# Patient Record
Sex: Female | Born: 1955
Health system: Southern US, Community
[De-identification: ages and names within clinical notes are randomized; demographics above are authoritative.]

## PROBLEM LIST (undated history)

## (undated) DIAGNOSIS — K5090 Crohn's disease, unspecified, without complications: Secondary | ICD-10-CM

## (undated) DIAGNOSIS — E785 Hyperlipidemia, unspecified: Secondary | ICD-10-CM

## (undated) DIAGNOSIS — T7840XA Allergy, unspecified, initial encounter: Secondary | ICD-10-CM

## (undated) DIAGNOSIS — I10 Essential (primary) hypertension: Secondary | ICD-10-CM

## (undated) DIAGNOSIS — C55 Malignant neoplasm of uterus, part unspecified: Secondary | ICD-10-CM

## (undated) DIAGNOSIS — Z86010 Personal history of colonic polyps: Secondary | ICD-10-CM

## (undated) DIAGNOSIS — G709 Myoneural disorder, unspecified: Secondary | ICD-10-CM

## (undated) DIAGNOSIS — G35 Multiple sclerosis: Secondary | ICD-10-CM

## (undated) DIAGNOSIS — H269 Unspecified cataract: Secondary | ICD-10-CM

## (undated) DIAGNOSIS — R011 Cardiac murmur, unspecified: Secondary | ICD-10-CM

## (undated) DIAGNOSIS — M1990 Unspecified osteoarthritis, unspecified site: Secondary | ICD-10-CM

## (undated) HISTORY — DX: Unspecified cataract: H26.9

## (undated) HISTORY — DX: Unspecified osteoarthritis, unspecified site: M19.90

## (undated) HISTORY — DX: Crohn's disease, unspecified, without complications: K50.90

## (undated) HISTORY — PX: OTHER SURGICAL HISTORY: SHX169

## (undated) HISTORY — DX: Multiple sclerosis: G35

## (undated) HISTORY — PX: WISDOM TOOTH EXTRACTION: SHX21

## (undated) HISTORY — DX: Personal history of colonic polyps: Z86.010

## (undated) HISTORY — DX: Malignant neoplasm of uterus, part unspecified: C55

## (undated) HISTORY — DX: Hyperlipidemia, unspecified: E78.5

## (undated) HISTORY — PX: SPINE SURGERY: SHX786

## (undated) HISTORY — PX: ABDOMINAL HYSTERECTOMY: SHX81

## (undated) HISTORY — PX: OOPHORECTOMY: SHX86

## (undated) HISTORY — DX: Allergy, unspecified, initial encounter: T78.40XA

## (undated) HISTORY — PX: APPENDECTOMY: SHX54

## (undated) HISTORY — DX: Essential (primary) hypertension: I10

## (undated) HISTORY — PX: EYE SURGERY: SHX253

## (undated) HISTORY — PX: COLON SURGERY: SHX602

## (undated) HISTORY — DX: Myoneural disorder, unspecified: G70.9

## (undated) HISTORY — DX: Cardiac murmur, unspecified: R01.1

## (undated) HISTORY — PX: COLONOSCOPY: SHX174

---

## 1987-10-21 DIAGNOSIS — C55 Malignant neoplasm of uterus, part unspecified: Secondary | ICD-10-CM

## 1987-10-21 HISTORY — DX: Malignant neoplasm of uterus, part unspecified: C55

## 1998-04-02 ENCOUNTER — Other Ambulatory Visit: Admission: RE | Admit: 1998-04-02 | Discharge: 1998-04-02 | Payer: Self-pay | Admitting: Internal Medicine

## 1998-05-17 ENCOUNTER — Ambulatory Visit (HOSPITAL_COMMUNITY): Admission: RE | Admit: 1998-05-17 | Discharge: 1998-05-17 | Payer: Self-pay | Admitting: Internal Medicine

## 2002-01-03 ENCOUNTER — Other Ambulatory Visit: Admission: RE | Admit: 2002-01-03 | Discharge: 2002-01-03 | Payer: Self-pay | Admitting: Neurosurgery

## 2003-02-27 ENCOUNTER — Encounter: Payer: Self-pay | Admitting: Emergency Medicine

## 2003-02-27 ENCOUNTER — Emergency Department (HOSPITAL_COMMUNITY): Admission: EM | Admit: 2003-02-27 | Discharge: 2003-02-27 | Payer: Self-pay | Admitting: *Deleted

## 2003-09-04 ENCOUNTER — Other Ambulatory Visit: Admission: RE | Admit: 2003-09-04 | Discharge: 2003-09-04 | Payer: Self-pay | Admitting: Neurosurgery

## 2004-02-18 ENCOUNTER — Emergency Department (HOSPITAL_COMMUNITY): Admission: EM | Admit: 2004-02-18 | Discharge: 2004-02-18 | Payer: Self-pay | Admitting: Family Medicine

## 2004-12-20 ENCOUNTER — Ambulatory Visit: Payer: Self-pay | Admitting: Internal Medicine

## 2005-05-20 ENCOUNTER — Ambulatory Visit (HOSPITAL_COMMUNITY): Admission: RE | Admit: 2005-05-20 | Discharge: 2005-05-20 | Payer: Self-pay | Admitting: Internal Medicine

## 2005-10-20 ENCOUNTER — Encounter: Payer: Self-pay | Admitting: Internal Medicine

## 2005-10-20 LAB — CONVERTED CEMR LAB

## 2005-12-10 ENCOUNTER — Ambulatory Visit: Payer: Self-pay | Admitting: Internal Medicine

## 2005-12-17 ENCOUNTER — Ambulatory Visit: Payer: Self-pay | Admitting: Internal Medicine

## 2006-03-11 ENCOUNTER — Ambulatory Visit: Payer: Self-pay | Admitting: Internal Medicine

## 2006-03-20 ENCOUNTER — Ambulatory Visit: Payer: Self-pay | Admitting: Internal Medicine

## 2006-03-23 ENCOUNTER — Ambulatory Visit (HOSPITAL_COMMUNITY): Admission: RE | Admit: 2006-03-23 | Discharge: 2006-03-23 | Payer: Self-pay | Admitting: Infectious Diseases

## 2006-05-19 ENCOUNTER — Ambulatory Visit: Payer: Self-pay | Admitting: Internal Medicine

## 2006-07-13 ENCOUNTER — Ambulatory Visit: Payer: Self-pay | Admitting: Internal Medicine

## 2006-10-22 ENCOUNTER — Ambulatory Visit: Payer: Self-pay | Admitting: Internal Medicine

## 2006-10-22 LAB — CONVERTED CEMR LAB
ALT: 31 units/L (ref 0–40)
AST: 22 units/L (ref 0–37)
Albumin: 3.7 g/dL (ref 3.5–5.2)
Alkaline Phosphatase: 93 units/L (ref 39–117)
Basophils Absolute: 0 10*3/uL (ref 0.0–0.1)
Creatinine, Ser: 0.9 mg/dL (ref 0.4–1.2)
Eosinophil percent: 4.8 % (ref 0.0–5.0)
GFR calc non Af Amer: 70 mL/min
HCT: 42.7 % (ref 36.0–46.0)
MCHC: 34.9 g/dL (ref 30.0–36.0)
Neutro Abs: 5 10*3/uL (ref 1.4–7.7)
Potassium: 3.5 meq/L (ref 3.5–5.1)
RBC: 4.93 M/uL (ref 3.87–5.11)
RDW: 12.7 % (ref 11.5–14.6)
Sodium: 141 meq/L (ref 135–145)
Total Bilirubin: 0.9 mg/dL (ref 0.3–1.2)
Total Protein: 7.9 g/dL (ref 6.0–8.3)
VLDL: 26 mg/dL (ref 0–40)

## 2006-10-29 ENCOUNTER — Ambulatory Visit: Payer: Self-pay | Admitting: Internal Medicine

## 2007-06-02 ENCOUNTER — Encounter: Payer: Self-pay | Admitting: Internal Medicine

## 2007-06-02 DIAGNOSIS — E785 Hyperlipidemia, unspecified: Secondary | ICD-10-CM | POA: Insufficient documentation

## 2007-06-02 DIAGNOSIS — J309 Allergic rhinitis, unspecified: Secondary | ICD-10-CM | POA: Insufficient documentation

## 2007-06-02 DIAGNOSIS — I10 Essential (primary) hypertension: Secondary | ICD-10-CM | POA: Insufficient documentation

## 2007-10-21 LAB — CONVERTED CEMR LAB: Pap Smear: NORMAL

## 2007-10-22 ENCOUNTER — Ambulatory Visit: Payer: Self-pay | Admitting: Internal Medicine

## 2007-10-22 LAB — CONVERTED CEMR LAB
ALT: 37 units/L — ABNORMAL HIGH (ref 0–35)
Basophils Relative: 0.1 % (ref 0.0–1.0)
Bilirubin, Direct: 0.2 mg/dL (ref 0.0–0.3)
CO2: 31 meq/L (ref 19–32)
Eosinophils Relative: 5.6 % — ABNORMAL HIGH (ref 0.0–5.0)
GFR calc Af Amer: 113 mL/min
Glucose, Bld: 94 mg/dL (ref 70–99)
Hemoglobin: 13.9 g/dL (ref 12.0–15.0)
Ketones, urine, test strip: NEGATIVE
Lymphocytes Relative: 27.2 % (ref 12.0–46.0)
Monocytes Absolute: 0.6 10*3/uL (ref 0.2–0.7)
Neutro Abs: 4.8 10*3/uL (ref 1.4–7.7)
Neutrophils Relative %: 59.1 % (ref 43.0–77.0)
Nitrite: NEGATIVE
Potassium: 3.5 meq/L (ref 3.5–5.1)
Specific Gravity, Urine: 1.02
Total Protein: 7 g/dL (ref 6.0–8.3)
VLDL: 27 mg/dL (ref 0–40)
WBC Urine, dipstick: NEGATIVE
WBC: 8 10*3/uL (ref 4.5–10.5)

## 2007-11-03 ENCOUNTER — Encounter: Payer: Self-pay | Admitting: Internal Medicine

## 2007-11-03 ENCOUNTER — Ambulatory Visit: Payer: Self-pay | Admitting: Internal Medicine

## 2007-11-03 ENCOUNTER — Other Ambulatory Visit: Admission: RE | Admit: 2007-11-03 | Discharge: 2007-11-03 | Payer: Self-pay | Admitting: *Deleted

## 2007-11-03 DIAGNOSIS — H60399 Other infective otitis externa, unspecified ear: Secondary | ICD-10-CM | POA: Insufficient documentation

## 2007-11-12 ENCOUNTER — Telehealth: Payer: Self-pay | Admitting: *Deleted

## 2008-01-19 ENCOUNTER — Encounter: Payer: Self-pay | Admitting: Internal Medicine

## 2008-01-19 ENCOUNTER — Ambulatory Visit: Payer: Self-pay | Admitting: Internal Medicine

## 2008-03-10 ENCOUNTER — Encounter: Payer: Self-pay | Admitting: Internal Medicine

## 2008-03-10 ENCOUNTER — Ambulatory Visit: Payer: Self-pay | Admitting: Internal Medicine

## 2008-03-16 ENCOUNTER — Encounter: Payer: Self-pay | Admitting: Internal Medicine

## 2008-03-31 ENCOUNTER — Emergency Department (HOSPITAL_COMMUNITY): Admission: EM | Admit: 2008-03-31 | Discharge: 2008-03-31 | Payer: Self-pay | Admitting: Family Medicine

## 2008-05-08 ENCOUNTER — Ambulatory Visit: Payer: Self-pay | Admitting: Internal Medicine

## 2008-05-08 DIAGNOSIS — T887XXA Unspecified adverse effect of drug or medicament, initial encounter: Secondary | ICD-10-CM | POA: Insufficient documentation

## 2008-05-08 LAB — CONVERTED CEMR LAB
ALT: 30 units/L (ref 0–35)
Bilirubin, Direct: 0.1 mg/dL (ref 0.0–0.3)
CO2: 31 meq/L (ref 19–32)
Calcium: 9.3 mg/dL (ref 8.4–10.5)
GFR calc Af Amer: 112 mL/min
GFR calc non Af Amer: 93 mL/min
HDL: 32.4 mg/dL — ABNORMAL LOW (ref >39.0)
Sodium: 142 meq/L (ref 135–145)
Total Bilirubin: 0.7 mg/dL (ref 0.3–1.2)
Total CHOL/HDL Ratio: 3.7

## 2008-08-14 ENCOUNTER — Emergency Department (HOSPITAL_COMMUNITY): Admission: EM | Admit: 2008-08-14 | Discharge: 2008-08-14 | Payer: Self-pay | Admitting: Family Medicine

## 2008-10-19 ENCOUNTER — Ambulatory Visit: Payer: Self-pay | Admitting: Internal Medicine

## 2008-10-19 LAB — CONVERTED CEMR LAB
ALT: 35 units/L (ref 0–35)
AST: 26 units/L (ref 0–37)
Basophils Absolute: 0.1 10*3/uL (ref 0.0–0.1)
Bilirubin, Direct: 0.1 mg/dL (ref 0.0–0.3)
Blood in Urine, dipstick: NEGATIVE
CO2: 32 meq/L (ref 19–32)
Calcium: 9.3 mg/dL (ref 8.4–10.5)
Chloride: 100 meq/L (ref 96–112)
Cholesterol: 131 mg/dL (ref 0–200)
Eosinophils Absolute: 0.3 10*3/uL (ref 0.0–0.7)
GFR calc non Af Amer: 93 mL/min
HDL: 36.7 mg/dL — ABNORMAL LOW (ref >39.0)
Ketones, urine, test strip: NEGATIVE
LDL Cholesterol: 73 mg/dL (ref 0–99)
Lymphocytes Relative: 26.7 % (ref 12.0–46.0)
MCHC: 34.1 g/dL (ref 30.0–36.0)
MCV: 89.8 fL (ref 78.0–100.0)
Neutrophils Relative %: 58.9 % (ref 43.0–77.0)
RDW: 13 % (ref 11.5–14.6)
Sodium: 141 meq/L (ref 135–145)
TSH: 1.9 microintl units/mL (ref 0.35–5.50)
Total Bilirubin: 0.9 mg/dL (ref 0.3–1.2)
Triglycerides: 106 mg/dL (ref 0–149)
Urobilinogen, UA: 0.2
VLDL: 21 mg/dL (ref 0–40)

## 2008-10-26 ENCOUNTER — Ambulatory Visit: Payer: Self-pay | Admitting: Internal Medicine

## 2008-10-26 DIAGNOSIS — J0100 Acute maxillary sinusitis, unspecified: Secondary | ICD-10-CM | POA: Insufficient documentation

## 2008-10-26 LAB — CONVERTED CEMR LAB
CO2: 32 meq/L (ref 19–32)
Calcium: 10.2 mg/dL (ref 8.4–10.5)
GFR calc Af Amer: 84 mL/min
Potassium: 4 meq/L (ref 3.5–5.1)
Sodium: 141 meq/L (ref 135–145)

## 2009-06-03 ENCOUNTER — Emergency Department (HOSPITAL_COMMUNITY): Admission: EM | Admit: 2009-06-03 | Discharge: 2009-06-03 | Payer: Self-pay | Admitting: Emergency Medicine

## 2009-10-23 ENCOUNTER — Ambulatory Visit: Payer: Self-pay | Admitting: Internal Medicine

## 2009-10-23 LAB — CONVERTED CEMR LAB
ALT: 21 units/L (ref 0–35)
BUN: 15 mg/dL (ref 6–23)
Basophils Absolute: 0.1 10*3/uL (ref 0.0–0.1)
CO2: 29 meq/L (ref 19–32)
Chloride: 99 meq/L (ref 96–112)
Cholesterol: 132 mg/dL (ref 0–200)
Eosinophils Relative: 3.7 % (ref 0.0–5.0)
Glucose, Bld: 109 mg/dL — ABNORMAL HIGH (ref 70–99)
Glucose, Urine, Semiquant: NEGATIVE
HCT: 42.5 % (ref 36.0–46.0)
Lymphs Abs: 2 10*3/uL (ref 0.7–4.0)
MCV: 90.4 fL (ref 78.0–100.0)
Monocytes Absolute: 0.7 10*3/uL (ref 0.1–1.0)
Platelets: 268 10*3/uL (ref 150.0–400.0)
Potassium: 3.3 meq/L — ABNORMAL LOW (ref 3.5–5.1)
RDW: 13.4 % (ref 11.5–14.6)
Total Bilirubin: 1.1 mg/dL (ref 0.3–1.2)
pH: 5

## 2009-11-12 ENCOUNTER — Other Ambulatory Visit: Admission: RE | Admit: 2009-11-12 | Discharge: 2009-11-12 | Payer: Self-pay | Admitting: Internal Medicine

## 2009-11-12 ENCOUNTER — Ambulatory Visit: Payer: Self-pay | Admitting: Internal Medicine

## 2009-11-12 DIAGNOSIS — H6120 Impacted cerumen, unspecified ear: Secondary | ICD-10-CM | POA: Insufficient documentation

## 2009-11-21 LAB — CONVERTED CEMR LAB: Pap Smear: NEGATIVE

## 2010-01-04 ENCOUNTER — Ambulatory Visit: Payer: Self-pay | Admitting: Internal Medicine

## 2010-01-04 LAB — CONVERTED CEMR LAB
CO2: 32 meq/L (ref 19–32)
Calcium: 9.4 mg/dL (ref 8.4–10.5)
Chloride: 103 meq/L (ref 96–112)
Glucose, Bld: 102 mg/dL — ABNORMAL HIGH (ref 70–99)
Sodium: 139 meq/L (ref 135–145)

## 2010-01-10 ENCOUNTER — Ambulatory Visit: Payer: Self-pay | Admitting: Internal Medicine

## 2010-01-10 DIAGNOSIS — R609 Edema, unspecified: Secondary | ICD-10-CM | POA: Insufficient documentation

## 2010-01-10 DIAGNOSIS — M170 Bilateral primary osteoarthritis of knee: Secondary | ICD-10-CM | POA: Insufficient documentation

## 2010-02-11 ENCOUNTER — Ambulatory Visit: Payer: Self-pay | Admitting: Internal Medicine

## 2010-05-09 ENCOUNTER — Ambulatory Visit: Payer: Self-pay | Admitting: Internal Medicine

## 2010-11-11 ENCOUNTER — Other Ambulatory Visit: Payer: Self-pay | Admitting: Internal Medicine

## 2010-11-11 ENCOUNTER — Ambulatory Visit
Admission: RE | Admit: 2010-11-11 | Discharge: 2010-11-11 | Payer: Self-pay | Source: Home / Self Care | Attending: Internal Medicine | Admitting: Internal Medicine

## 2010-11-11 LAB — HEPATIC FUNCTION PANEL
ALT: 18 U/L (ref 0–35)
AST: 19 U/L (ref 0–37)
Albumin: 4 g/dL (ref 3.5–5.2)
Total Bilirubin: 0.8 mg/dL (ref 0.3–1.2)
Total Protein: 7.2 g/dL (ref 6.0–8.3)

## 2010-11-11 LAB — CBC WITH DIFFERENTIAL/PLATELET
Basophils Relative: 0.4 % (ref 0.0–3.0)
Eosinophils Relative: 1.8 % (ref 0.0–5.0)
HCT: 40.7 % (ref 36.0–46.0)
Hemoglobin: 13.6 g/dL (ref 12.0–15.0)
Lymphs Abs: 1.8 10*3/uL (ref 0.7–4.0)
MCV: 88.8 fl (ref 78.0–100.0)
Monocytes Absolute: 0.8 10*3/uL (ref 0.1–1.0)
Monocytes Relative: 8.4 % (ref 3.0–12.0)
Neutro Abs: 6.4 10*3/uL (ref 1.4–7.7)
WBC: 9.2 10*3/uL (ref 4.5–10.5)

## 2010-11-11 LAB — LIPID PANEL
Cholesterol: 106 mg/dL (ref 0–200)
LDL Cholesterol: 54 mg/dL (ref 0–99)
VLDL: 17.4 mg/dL (ref 0.0–40.0)

## 2010-11-11 LAB — CONVERTED CEMR LAB
Protein, U semiquant: NEGATIVE
Urobilinogen, UA: 0.2
WBC Urine, dipstick: NEGATIVE

## 2010-11-11 LAB — BASIC METABOLIC PANEL
Chloride: 102 mEq/L (ref 96–112)
GFR: 59.43 mL/min — ABNORMAL LOW (ref >60.00)
Potassium: 4.7 mEq/L (ref 3.5–5.1)
Sodium: 140 mEq/L (ref 135–145)

## 2010-11-11 LAB — TSH: TSH: 1.12 u[IU]/mL (ref 0.35–5.50)

## 2010-11-17 LAB — CONVERTED CEMR LAB
Cholesterol, target level: 200 mg/dL
LDL Goal: 130 mg/dL

## 2010-11-18 ENCOUNTER — Ambulatory Visit
Admission: RE | Admit: 2010-11-18 | Discharge: 2010-11-18 | Payer: Self-pay | Source: Home / Self Care | Attending: Internal Medicine | Admitting: Internal Medicine

## 2010-11-18 DIAGNOSIS — N61 Mastitis without abscess: Secondary | ICD-10-CM | POA: Insufficient documentation

## 2010-11-19 NOTE — Assessment & Plan Note (Signed)
Summary: 2 month follow up/cjr   Vital Signs:  Patient profile:   55 year old female Height:      63 inches Weight:      246 pounds BMI:     43.73 Temp:     98.2 degrees F oral Pulse rate:   72 / minute Resp:     14 per minute BP sitting:   130 / 80  (left arm) Cuff size:   large  Vitals Entered By: Allyne Gee, LPN (January 10, 2201 5:42 AM) CC: roa labs   Primary Care Provider:  Benay Pillow  CC:  roa labs.  History of Present Illness: increased swelling in the left foot  new job is  more seditary right knee pain has increased and  exercize has decreased this has been an issue for  at least 4-5 the pai is more medically and their is not acute injury hx the right knee is "normal"   Preventive Screening-Counseling & Management  Alcohol-Tobacco     Smoking Status: never     Passive Smoke Exposure: no  Current Problems (verified): 1)  Cerumen Impaction, Bilateral  (ICD-380.4) 2)  Morbid Obesity  (ICD-278.01) 3)  Sinusitis, Maxillary, Acute  (ICD-461.0) 4)  Uns Advrs Eff Uns Rx Medicinal&biological Sbstnc  (ICD-995.20) 5)  Special Screening For Malignant Neoplasms Colon  (ICD-V76.51) 6)  Otitis Externa, Bilateral  (ICD-380.10) 7)  Family History of Cad Female 1st Degree Relative <50  (ICD-V17.3) 8)  Physical Examination  (ICD-V70.0) 9)  ? of Crohn's Disease  (ICD-555.9) 10)  Hypertension  (ICD-401.9) 11)  Hyperlipidemia  (ICD-272.4) 12)  Allergic Rhinitis  (ICD-477.9)  Current Medications (verified): 1)  Multivitamins   Caps (Multiple Vitamin) .... Once Daily 2)  Tenoretic 50 50-25 Mg  Tabs (Atenolol-Chlorthalidone) .... Take 1 Tablet By Mouth Once A Day 3)  Lipitor 20 Mg  Tabs (Atorvastatin Calcium) .... Take 1 Tablet By Mouth At Bedtime 4)  Allegra-D 12 Hour 60-120 Mg Xr12h-Tab (Fexofenadine-Pseudoephedrine) .... One By Mouth Bid 5)  Cortomycin 3.5-10000-1 Susp (Neomycin-Polymyxin-Hc) .... 4 Drops in Both Ears  Qid 6)  Potassium Chloride Cr 8 Meq Cr-Tabs  (Potassium Chloride) .... One By Mouth Daily  Allergies (verified): 1)  ! Septra (Sulfamethoxazole-Trimethoprim) 2)  ! 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  Past Medical History: Reviewed history from 01/19/2008 and no changes required. Allergic rhinitis Hyperlipidemia Hypertension  ? Crohns colitis  Past Surgical History: Reviewed history from 01/19/2008 and no changes required. Appendectomy Hysterectomy Oophorectomy partial  Family History: Reviewed history from 11/03/2007 and no changes required. Family History of CAD Female 1st degree relative <50 CABG Family History Hypertension mother uterine cancer  Social History: Reviewed history from 01/19/2008 and no changes required. 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    Physical Exam  General:  healthy appearing and obese.   Head:  normocephalic.  no abnormalities palpated.    Eyes:  no icterus.pupils equal, pupils round, and pupils reactive to light.   Nose:  no external deformity and nasal dischargemucosal pallor.   Neck:  Supple; no masses or thyromegaly. Lungs:  Clear throughout to auscultation. Heart:  Regular rate and rhythm; no murmurs, rubs,  or bruits. Abdomen:  Bowel sounds positive,abdomen soft and non-tender without masses, organomegaly or hernias noted. Msk:  No deformity or scoliosis noted of thoracic or lumbar spine.   Pulses:  R and L carotid,radial,femoral,dorsalis pedis and posterior tibial pulses are full and equal bilaterally Extremities:  No clubbing, cyanosis, edema, or deformity noted with normal full range of motion of all joints.   Neurologic:  No cranial nerve deficits noted. Station and gait are normal. Plantar reflexes are down-going bilaterally. DTRs are symmetrical throughout. Sensory, motor and coordinative functions appear intact.   Impression & Recommendations:  Problem # 1:  MORBID OBESITY (ICD-278.01)  Ht: 63 (01/10/2010)   Wt: 246 (01/10/2010)   BMI: 43.73 (01/10/2010)  Problem # 2:  HYPERTENSION (ICD-401.9)  the potassium has increased Her updated medication list for this problem includes:    Tenoretic 50 50-25 Mg Tabs (Atenolol-chlorthalidone) .Marland Kitchen... Take 1 tablet by mouth once a day  BP today: 130/80 Prior BP: 130/80 (11/12/2009)  Prior 10 Yr Risk Heart Disease: 7 % (10/26/2008)  Labs Reviewed: K+: 3.6 (01/04/2010) Creat: : 0.8 (01/04/2010)   Chol: 132 (10/23/2009)   HDL: 38.30 (10/23/2009)   LDL: 68 (10/23/2009)   TG: 130.0 (10/23/2009)  Problem # 3:  LOC OSTEOARTHROS NOT SPEC PRIM/SEC LOWER LEG (ICD-715.36)  Discussed use of medications, application of heat or cold, and exercises.   Orders: Joint Aspirate / Injection, Large (20610) Depo- Medrol 86m (J1030) T-Knee Comp Left 4 Views ((11735AP  Problem # 4:  EDEMA (IOLI-1033)  dependent edema mobilizations is the best intervention conside compresion  stocking  Her updated medication list for this problem includes:    Tenoretic 50 50-25 Mg Tabs (Atenolol-chlorthalidone) ..Marland Kitchen.. Take 1 tablet by mouth once a day  Discussed elevation of the legs, use of compression stockings, sodium restiction, and medication use.   Complete Medication List: 1)  Multivitamins Caps (Multiple vitamin) .... Once daily 2)  Tenoretic 50 50-25 Mg Tabs (Atenolol-chlorthalidone) .... Take 1 tablet by mouth once a day 3)  Lipitor 20 Mg Tabs (Atorvastatin calcium) .... Take 1 tablet by mouth at bedtime 4)  Allegra-d 12 Hour 60-120 Mg Xr12h-tab (Fexofenadine-pseudoephedrine) .... One by mouth bid 5)  Cortomycin 3.5-10000-1 Susp (Neomycin-polymyxin-hc) .... 4 drops in both ears  qid 6)  Klor-con M10 10 Meq Cr-tabs (Potassium chloride crys cr) .... 0ne by mouth daily  Patient Instructions: 1)  Please schedule a follow-up appointment in 4 months. 2)  if the knee pain does not improve and the paln films shjow arthritis we will consider an MRI and ortho referal 3)  consider compression stockings  Prescriptions: KLOR-CON M10 10 MEQ CR-TABS (POTASSIUM CHLORIDE CRYS CR) 0ne by mouth daily  #30 x 11   Entered and Authorized by:   JRicard DillonMD   Signed by:   JRicard DillonMD on 01/10/2010   Method used:   Electronically to        [facility])  [address], [place]  [address]       Ph: [phone]       Fax: [phone]   RxID:   [id number]

## 2010-11-19 NOTE — Assessment & Plan Note (Signed)
Summary: 4 monthrov/njr   Vital Signs:  Patient profile:   55 year old female Height:      63 inches Weight:      246 pounds BMI:     43.73 Temp:     98.2 degrees F oral Pulse rate:   72 / minute Resp:     14 per minute BP sitting:   124 / 80  (left arm) Cuff size:   large  Vitals Entered By: Allyne Gee, LPN (May 09, 8785 7:67 AM) CC: roa- discuss knee xray, Hypertension Management, Lipid Management Is Patient Diabetic? No   Primary Care Provider:  Benay Pillow  CC:  roa- discuss knee xray, Hypertension Management, and Lipid Management.  History of Present Illness: follow up visit/ good response to the injection and review of the xray with mild to moderate OA CPX due in Jan  Hypertension History:      She denies headache, chest pain, palpitations, dyspnea with exertion, orthopnea, PND, peripheral edema, visual symptoms, neurologic problems, syncope, and side effects from treatment.        Positive major cardiovascular risk factors include female age 77 years old or older, hyperlipidemia, hypertension, and family history for ischemic heart disease (males less than 45 years old).  Negative major cardiovascular risk factors include no history of diabetes and non-tobacco-user status.        Further assessment for target organ damage reveals no history of ASHD, stroke/TIA, or peripheral vascular disease.    Lipid Management History:      Positive NCEP/ATP III risk factors include female age 50 years old or older, HDL cholesterol less than 68, family history for ischemic heart disease (males less than 43 years old), and hypertension.  Negative NCEP/ATP III risk factors include no history of early menopause without estrogen hormone replacement, non-diabetic, non-tobacco-user status, no ASHD (atherosclerotic heart disease), no prior stroke/TIA, no peripheral vascular disease, and no history of aortic aneurysm.        Adjunctive measures started by the patient include aerobic  exercise, omega-3 supplements, and weight reduction.  She expresses no side effects from her lipid-lowering medication.  The patient denies any symptoms to suggest myopathy or liver disease.      Preventive Screening-Counseling & Management  Alcohol-Tobacco     Smoking Status: never     Passive Smoke Exposure: no  Problems Prior to Update: 1)  Edema  (ICD-782.3) 2)  Loc Osteoarthros Not Spec Prim/sec Lower Leg  (ICD-715.36) 3)  Cerumen Impaction, Bilateral  (ICD-380.4) 4)  Morbid Obesity  (ICD-278.01) 5)  Sinusitis, Maxillary, Acute  (ICD-461.0) 6)  Uns Advrs Eff Uns Rx Medicinal&biological Sbstnc  (ICD-995.20) 7)  Special Screening For Malignant Neoplasms Colon  (ICD-V76.51) 8)  Otitis Externa, Bilateral  (ICD-380.10) 9)  Family History of Cad Female 1st Degree Relative <50  (ICD-V17.3) 10)  Physical Examination  (ICD-V70.0) 11)  ? of Crohn's Disease  (ICD-555.9) 12)  Hypertension  (ICD-401.9) 13)  Hyperlipidemia  (ICD-272.4) 14)  Allergic Rhinitis  (ICD-477.9)  Current Problems (verified): 1)  Edema  (ICD-782.3) 2)  Loc Osteoarthros Not Spec Prim/sec Lower Leg  (ICD-715.36) 3)  Cerumen Impaction, Bilateral  (ICD-380.4) 4)  Morbid Obesity  (ICD-278.01) 5)  Sinusitis, Maxillary, Acute  (ICD-461.0) 6)  Uns Advrs Eff Uns Rx Medicinal&biological Sbstnc  (ICD-995.20) 7)  Special Screening For Malignant Neoplasms Colon  (ICD-V76.51) 8)  Otitis Externa, Bilateral  (ICD-380.10) 9)  Family History of Cad Female 1st Degree Relative <50  (ICD-V17.3) 10)  Physical Examination  (  ICD-V70.0) 11)  ? of Crohn's Disease  (ICD-555.9) 12)  Hypertension  (ICD-401.9) 13)  Hyperlipidemia  (ICD-272.4) 14)  Allergic Rhinitis  (ICD-477.9)  Medications Prior to Update: 1)  Multivitamins   Caps (Multiple Vitamin) .... Once Daily 2)  Tenoretic 50 50-25 Mg  Tabs (Atenolol-Chlorthalidone) .... Take 1 Tablet By Mouth Once A Day 3)  Lipitor 20 Mg  Tabs (Atorvastatin Calcium) .... Take 1 Tablet By Mouth At  Bedtime 4)  Allegra-D 12 Hour 60-120 Mg Xr12h-Tab (Fexofenadine-Pseudoephedrine) .... One By Mouth Bid 5)  Cortomycin 3.5-10000-1 Susp (Neomycin-Polymyxin-Hc) .... 4 Drops in Both Ears  Qid 6)  Klor-Con M10 10 Meq Cr-Tabs (Potassium Chloride Crys Cr) .... 0ne By Mouth Daily  Current Medications (verified): 1)  Multivitamins   Caps (Multiple Vitamin) .... Once Daily 2)  Tenoretic 50 50-25 Mg  Tabs (Atenolol-Chlorthalidone) .... Take 1 Tablet By Mouth Once A Day 3)  Lipitor 20 Mg  Tabs (Atorvastatin Calcium) .... Take 1 Tablet By Mouth At Bedtime 4)  Allegra-D 12 Hour 60-120 Mg Xr12h-Tab (Fexofenadine-Pseudoephedrine) .... One By Mouth Bid 5)  Cortomycin 3.5-10000-1 Susp (Neomycin-Polymyxin-Hc) .... 4 Drops in Both Ears  Qid 6)  Klor-Con M10 10 Meq Cr-Tabs (Potassium Chloride Crys Cr) .... 0ne By Mouth Daily  Allergies (verified): 1)  ! Septra (Sulfamethoxazole-Trimethoprim) 2)  ! 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  Past Medical History: Reviewed history from 01/19/2008 and no changes required. Allergic rhinitis Hyperlipidemia Hypertension  ? Crohns colitis  Past Surgical History: Reviewed history from 01/19/2008 and no changes required. Appendectomy Hysterectomy Oophorectomy partial  Family History: Reviewed history from 11/03/2007 and no changes required. Family History of CAD Female 1st degree relative <50 CABG Family History Hypertension mother uterine cancer  Social  History: Reviewed history from 01/19/2008 and no changes required. 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    Physical Exam  General:  healthy appearing and obese.   Head:  normocephalic.  no abnormalities palpated.   Eyes:  no icterus.pupils equal, pupils round, and pupils reactive to light.   Ears:  R ear normal and L ear normal.   Nose:  no external deformity and nasal dischargemucosal pallor.   Mouth:  pharynx pink and moist and posterior lymphoid hypertrophy.   Neck:  Supple; no masses or thyromegaly. Chest Wall:  No deformities, masses, or tenderness noted. Lungs:  Clear throughout to auscultation. Heart:  Regular rate and rhythm; no murmurs, rubs,  or bruits. Abdomen:  Bowel sounds positive,abdomen soft and non-tender without masses, organomegaly or hernias noted.   Impression & Recommendations:  Problem # 1:  HYPERTENSION (ICD-401.9) Assessment Unchanged  Her updated medication list for this  problem includes:    Tenoretic 50 50-25 Mg Tabs (Atenolol-chlorthalidone) .Marland Kitchen... Take 1 tablet by mouth once a day  BP today: 124/80 Prior BP: 130/80 (01/10/2010)  10 Yr Risk Heart Disease: 8 % Prior 10 Yr Risk Heart Disease: 7 % (10/26/2008)  Labs Reviewed: K+: 3.6 (01/04/2010) Creat: : 0.8 (01/04/2010)   Chol: 132 (10/23/2009)   HDL: 38.30 (10/23/2009)   LDL: 68 (10/23/2009)   TG: 130.0 (10/23/2009)  Problem # 2:   HYPERLIPIDEMIA (ICD-272.4) Assessment: Unchanged  Her updated medication list for this problem includes:    Lipitor 20 Mg Tabs (Atorvastatin calcium) .Marland Kitchen... Take 1 tablet by mouth at bedtime  Labs Reviewed: SGOT: 20 (10/23/2009)   SGPT: 21 (10/23/2009)  Lipid Goals: Chol Goal: 200 (11/03/2007)   HDL Goal: 40 (11/03/2007)   LDL Goal: 130 (11/03/2007)   TG Goal: 150 (11/03/2007)  10 Yr Risk Heart Disease: 8 % Prior 10 Yr Risk Heart Disease: 7 % (10/26/2008)   HDL:38.30 (10/23/2009), 36.7 (10/19/2008)  LDL:68 (10/23/2009), 73 (10/19/2008)  Chol:132 (10/23/2009), 131 (10/19/2008)  Trig:130.0 (10/23/2009), 106 (10/19/2008)  Problem # 3:  ALLERGIC RHINITIS (ICD-477.9) Assessment: Unchanged  Discussed use of allergy medications and environmental measures.   Problem # 4:  MORBID OBESITY (ICD-278.01) Assessment: Unchanged  Ht: 63 (05/09/2010)   Wt: 246 (05/09/2010)   BMI: 43.73 (05/09/2010)  Complete Medication List: 1)  Multivitamins Caps (Multiple vitamin) .... Once daily 2)  Tenoretic 50 50-25 Mg Tabs (Atenolol-chlorthalidone) .... Take 1 tablet by mouth once a day 3)  Lipitor 20 Mg Tabs (Atorvastatin calcium) .... Take 1 tablet by mouth at bedtime 4)  Allegra-d 12 Hour 60-120 Mg Xr12h-tab (Fexofenadine-pseudoephedrine) .... One by mouth bid 5)  Cortomycin 3.5-10000-1 Susp (Neomycin-polymyxin-hc) .... 4 drops in both ears  qid 6)  Klor-con M10 10 Meq Cr-tabs (Potassium chloride crys cr) .... 0ne by mouth daily  Hypertension Assessment/Plan:      The patient's hypertensive risk group is category B: At least one risk factor (excluding diabetes) with no target organ damage.  Her calculated 10 year risk of coronary heart disease is 8 %.  Today's blood pressure is 124/80.  Her blood pressure goal is < 140/90.  Lipid Assessment/Plan:      Based on NCEP/ATP III, the patient's risk factor category is "2 or more risk factors and a calculated 10 year CAD risk of < 20%".  The patient's lipid  goals are as follows: Total cholesterol goal is 200; LDL cholesterol goal is 130; HDL cholesterol goal is 40; Triglyceride goal is 150.  Her LDL cholesterol goal has been met.    Patient Instructions: 1)  Jan CPX  Prevention & Chronic Care Immunizations   Influenza vaccine: Historical  (08/24/2009)    Tetanus booster: 10/20/2005: Tdap    Pneumococcal vaccine: Not documented  Colorectal Screening   Hemoccult: Not documented    Colonoscopy: Location:  [place].    (03/10/2008)   Colonoscopy due: 02/2018  Other Screening   Pap smear: normal  (11/12/2009)   Pap smear due: 11/2010    Mammogram: normal  (01/17/2009)   Mammogram due: 01/2010   Smoking status: never  (05/09/2010)  Lipids   Total Cholesterol: 132  (10/23/2009)   LDL: 68  (10/23/2009)   LDL Direct: Not documented   HDL: 38.30  (10/23/2009)   Triglycerides: 130.0  (10/23/2009)    SGOT (AST): 20  (10/23/2009)   SGPT (ALT): 21  (10/23/2009)   Alkaline phosphatase: 97  (10/23/2009)   Total bilirubin: 1.1  (  10/23/2009)  Hypertension   Last Blood Pressure: 124 / 80  (05/09/2010)   Serum creatinine: 0.8  (01/04/2010)   Serum potassium 3.6  (01/04/2010)  Self-Management Support :    Hypertension self-management support: Not documented    Lipid self-management support: Not documented

## 2010-11-19 NOTE — Assessment & Plan Note (Signed)
Summary: cpx abnd pap/bmw   Vital Signs:  Patient profile:   55 year old female Height:      63 inches Weight:      247 pounds BMI:     43.91 Temp:     98.2 degrees F oral Pulse rate:   72 / minute Resp:     14 per minute BP sitting:   130 / 80  (left arm)  Vitals Entered By: Allyne Gee, LPN (November 12, 930 12:06 PM) CC: cpx and pap     Last PAP Result normal   Primary Care Provider:  Benay Pillow  CC:  cpx and pap.  History of Present Illness: The pt was asked about all immunizations, health maint. services that are appropriate to their age and was given guidance on diet exercize  and weight management wax impaction in both ears  Preventive Screening-Counseling & Management  Alcohol-Tobacco     Smoking Status: never     Passive Smoke Exposure: no  Problems Prior to Update: 1)  Morbid Obesity  (ICD-278.01) 2)  Sinusitis, Maxillary, Acute  (ICD-461.0) 3)  Uns Advrs Eff Uns Rx Medicinal&biological Sbstnc  (ICD-995.20) 4)  Special Screening For Malignant Neoplasms Colon  (ICD-V76.51) 5)  Otitis Externa, Bilateral  (ICD-380.10) 6)  Family History of Cad Female 1st Degree Relative <50  (ICD-V17.3) 7)  Physical Examination  (ICD-V70.0) 8)  ? of Crohn's Disease  (ICD-555.9) 9)  Hypertension  (ICD-401.9) 10)  Hyperlipidemia  (ICD-272.4) 11)  Allergic Rhinitis  (ICD-477.9)  Medications Prior to Update: 1)  Multivitamins   Caps (Multiple Vitamin) .... Once Daily 2)  Tenoretic 50 50-25 Mg  Tabs (Atenolol-Chlorthalidone) .... Take 1 Tablet By Mouth Once A Day 3)  Lipitor 20 Mg  Tabs (Atorvastatin Calcium) .... Take 1 Tablet By Mouth At Bedtime 4)  Allegra-D 12 Hour 60-120 Mg Xr12h-Tab (Fexofenadine-Pseudoephedrine) .... One By Mouth Bid 5)  Cortomycin 3.5-10000-1 Susp (Neomycin-Polymyxin-Hc) .... 4 Drops in Both Ears  Qid 6)  Clarithromycin 500 Mg Xr24h-Tab (Clarithromycin) .... Two By Mouth Daily  Current Medications (verified): 1)  Multivitamins   Caps (Multiple  Vitamin) .... Once Daily 2)  Tenoretic 50 50-25 Mg  Tabs (Atenolol-Chlorthalidone) .... Take 1 Tablet By Mouth Once A Day 3)  Lipitor 20 Mg  Tabs (Atorvastatin Calcium) .... Take 1 Tablet By Mouth At Bedtime 4)  Allegra-D 12 Hour 60-120 Mg Xr12h-Tab (Fexofenadine-Pseudoephedrine) .... One By Mouth Bid 5)  Cortomycin 3.5-10000-1 Susp (Neomycin-Polymyxin-Hc) .... 4 Drops in Both Ears  Qid 6)  Potassium Chloride Cr 8 Meq Cr-Tabs (Potassium Chloride) .... One By Mouth Daily  Allergies (verified): 1)  ! Septra (Sulfamethoxazole-Trimethoprim) 2)  ! 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  Past Medical History: Reviewed history from 01/19/2008 and no changes required. Allergic rhinitis Hyperlipidemia Hypertension  ? Crohns colitis  Past Surgical History: Reviewed history from 01/19/2008 and no changes required. Appendectomy Hysterectomy Oophorectomy partial  Family History: Reviewed history from 11/03/2007 and no changes required. Family History of CAD Female 1st degree relative <50 CABG Family History Hypertension mother uterine cancer  Social History: Reviewed history from 01/19/2008 and no changes required. 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   Head:  normocephalic.  no abnormalities palpated.   Eyes:  no icterus.pupils equal, pupils round, and pupils reactive to light.   Ears:  R ear  normal and L ear normal.   Nose:  no external deformity and nasal dischargemucosal pallor.   Mouth:  pharynx pink and moist and posterior lymphoid hypertrophy.   Neck:  Supple; no masses or thyromegaly. Chest Wall:  No deformities, masses, or tenderness noted. Breasts:  no masses and no abnormal thickening.   Lungs:  Clear throughout to auscultation. Heart:  Regular rate and rhythm; no murmurs, rubs,  or bruits. Genitalia:  normal introitus.  hysterectomy status Msk:  normal ROM.   Pulses:  R and L carotid,radial,femoral,dorsalis pedis and posterior tibial pulses are full and equal bilaterally Extremities:  1+ left pedal edema and 1+ right pedal edema.   Neurologic:  alert & oriented X3, DTRs symmetrical and normal, and finger-to-nose normal.     Impression & Recommendations:  Problem # 1:  PHYSICAL EXAMINATION (ICD-V70.0) Assessment Unchanged  Mammogram: normal (01/17/2009) Pap smear: normal (11/12/2009) Colonoscopy: Location:  [facility].   (03/10/2008) Td Booster: Tdap (10/20/2005)   Chol: 132 (10/23/2009)   HDL: 38.30 (10/23/2009)   LDL: 68 (10/23/2009)   TG: 130.0 (10/23/2009) TSH: 2.83 (10/23/2009)   Next mammogram due:: 01/2010 (11/12/2009) Next Colonoscopy due:: 02/2018 (03/28/2008)  Discussed using sunscreen, use of alcohol, drug use, self breast exam, routine dental care, routine eye care, schedule for GYN exam, routine physical exam, seat belts, multiple vitamins, osteoporosis prevention, adequate calcium intake in diet, recommendations for immunizations, mammograms and Pap smears.  Discussed exercise and checking cholesterol.  Discussed gun safety, safe sex, and contraception.  Problem # 2:  HYPERTENSION (ICD-401.9) Assessment: Deteriorated hypokalemia Her updated medication list for this problem includes:    Tenoretic 50 50-25 Mg Tabs (Atenolol-chlorthalidone) .Marland Kitchen... Take 1 tablet by mouth once a day  BP today: 130/80 Prior BP: 122/78  (10/26/2008)  Prior 10 Yr Risk Heart Disease: 7 % (10/26/2008)  Labs Reviewed: K+: 3.3 (10/23/2009) Creat: : 0.8 (10/23/2009)   Chol: 132 (10/23/2009)   HDL: 38.30 (10/23/2009)   LDL: 68 (10/23/2009)   TG: 130.0 (10/23/2009)  Her updated medication list for this problem includes:    Tenoretic 50 50-25 Mg Tabs (Atenolol-chlorthalidone) .Marland Kitchen... Take 1 tablet by mouth once a day  Problem # 3:  MORBID OBESITY (ICD-278.01) has lost 5 pounds Ht: 63 (11/12/2009)   Wt: 247 (11/12/2009)   BMI: 43.91 (11/12/2009)  Problem # 4:  HYPERLIPIDEMIA (ICD-272.4) stable Her updated medication list for this problem includes:    Lipitor 20 Mg Tabs (Atorvastatin calcium) .Marland Kitchen... Take 1 tablet by mouth at bedtime  Labs Reviewed: SGOT: 20 (10/23/2009)   SGPT: 21 (10/23/2009)  Lipid Goals: Chol Goal: 200 (11/03/2007)   HDL Goal: 40 (11/03/2007)   LDL Goal: 130 (11/03/2007)   TG Goal: 150 (11/03/2007)  Prior 10 Yr Risk Heart Disease: 7 % (10/26/2008)   HDL:38.30 (10/23/2009), 36.7 (10/19/2008)  LDL:68 (10/23/2009), 73 (10/19/2008)  Chol:132 (10/23/2009), 131 (10/19/2008)  Trig:130.0 (10/23/2009), 106 (10/19/2008)  Problem # 5:  CERUMEN IMPACTION, BILATERAL (ICD-380.4)  informed conset obtained, using a cerumin spoon the wax impaction was dislodged and the canal was lavaged with 1/2 peroxide and 1/2 warm water solution until clear  Orders:  Cerumen Impaction Removal (44695)  Complete Medication List: 1)  Multivitamins Caps (Multiple vitamin) .... Once daily 2)  Tenoretic 50 50-25 Mg Tabs (Atenolol-chlorthalidone) .... Take 1 tablet by mouth once a day 3)  Lipitor 20 Mg Tabs (Atorvastatin calcium) .... Take 1 tablet by mouth at bedtime 4)  Allegra-d 12 Hour 60-120 Mg Xr12h-tab (Fexofenadine-pseudoephedrine) .... One by mouth bid 5)  Cortomycin 3.5-10000-1 Susp (Neomycin-polymyxin-hc) .... 4 drops in both ears  qid 6)  Potassium Chloride Cr 8 Meq Cr-tabs (Potassium chloride) .... One by mouth  daily  Patient Instructions: 1)  Please schedule a follow-up appointment in 2 months. 2)  BMP prior to visit, ICD-9:401.9 Prescriptions: CORTOMYCIN 3.5-10000-1 SUSP (NEOMYCIN-POLYMYXIN-HC) 4 drops in both ears  qid  #10cc x 0   Entered and Authorized by:   Ricard Dillon MD   Signed by:   Ricard Dillon MD on 11/12/2009   Method used:   Electronically to        [facility] (retail)       [address], [place]  [address]       Ph: [phone]       Fax: [phone]   RxID:   [id number] POTASSIUM CHLORIDE CR 8 MEQ CR-TABS (POTASSIUM CHLORIDE) one by mouth daily  #30 x 11   Entered and Authorized by:   Ricard Dillon MD   Signed by:   Ricard Dillon MD on 11/12/2009   Method used:   Electronically to        [facility] (retail)       [address]       [place], [place]  [address]       Ph: [phone]       Fax: [phone]   RxID:   [id number]    Preventive Care Screening  Mammogram:    Date:  01/17/2009    Next Due:  01/2010    Results:  normal   Pap Smear:    Date:  11/12/2009    Next Due:  11/2010    Results:  normal    Orders Added: 1)  Est. Patient 40-64 years [59747] 2)  Cerumen Impaction Removal [69210]    Immunization History:  Influenza Immunization History:    Influenza:  historical (08/24/2009)

## 2010-11-27 NOTE — Assessment & Plan Note (Signed)
Summary: CPX/PAP/NJR   Vital Signs:  Patient profile:   55 year old female Height:      63 inches Weight:      221 pounds BMI:     39.29 Temp:     98.2 degrees F oral Pulse rate:   72 / minute Resp:     14 per minute BP sitting:   120 / 80  (left arm)  Vitals Entered By: Allyne Gee, LPN (November 18, 3210 8:50 AM) CC: cpx-mam scheduled for next week-pap last year--LOST 25 POUNDS  Is Patient Diabetic? No   Primary Care Provider:  Benay Pillow  CC:  cpx-mam scheduled for next week-pap last year--LOST 25 POUNDS .  History of Present Illness:  this is a 55 year old white female who presents for a routine complete physical examination and followup her comorbidities of hypertension hyperlipidemia and obesity. She has lost 31 pounds by our scales in 6 months for an excellent response both in her cholesterol blood pressure and in her blood glucose given her hypertension and hyperlipidemia and her glycemia it is likely that she has this metabolic syndrome and the weight loss has pulled her away from the edge of diabetes .The pt was asked about all immunizations, health maint. services that are appropriate to their age and was given guidance on diet exercize  and weight management   Preventive Screening-Counseling & Management  Alcohol-Tobacco     Smoking Status: never     Passive Smoke Exposure: no  Problems Prior to Update: 1)  Mastitis  (ICD-611.0) 2)  Edema  (ICD-782.3) 3)  Loc Osteoarthros Not Spec Prim/sec Lower Leg  (ICD-715.36) 4)  Cerumen Impaction, Bilateral  (ICD-380.4) 5)  Morbid Obesity  (ICD-278.01) 6)  Sinusitis, Maxillary, Acute  (ICD-461.0) 7)  Uns Advrs Eff Uns Rx Medicinal&biological Sbstnc  (ICD-995.20) 8)  Special Screening For Malignant Neoplasms Colon  (ICD-V76.51) 9)  Otitis Externa, Bilateral  (ICD-380.10) 10)  Family History of Cad Female 1st Degree Relative <50  (ICD-V17.3) 11)  Physical Examination  (ICD-V70.0) 12)  ? of Crohn's Disease   (ICD-555.9) 13)  Hypertension  (ICD-401.9) 14)  Hyperlipidemia  (ICD-272.4) 15)  Allergic Rhinitis  (ICD-477.9)  Current Problems (verified): 1)  Edema  (ICD-782.3) 2)  Loc Osteoarthros Not Spec Prim/sec Lower Leg  (ICD-715.36) 3)  Cerumen Impaction, Bilateral  (ICD-380.4) 4)  Morbid Obesity  (ICD-278.01) 5)  Sinusitis, Maxillary, Acute  (ICD-461.0) 6)  Uns Advrs Eff Uns Rx Medicinal&biological Sbstnc  (ICD-995.20) 7)  Special Screening For Malignant Neoplasms Colon  (ICD-V76.51) 8)  Otitis Externa, Bilateral  (ICD-380.10) 9)  Family History of Cad Female 1st Degree Relative <50  (ICD-V17.3) 10)  Physical Examination  (ICD-V70.0) 11)  ? of Crohn's Disease  (ICD-555.9) 12)  Hypertension  (ICD-401.9) 13)  Hyperlipidemia  (ICD-272.4) 14)  Allergic Rhinitis  (ICD-477.9)  Medications Prior to Update: 1)  Multivitamins   Caps (Multiple Vitamin) .... Once Daily 2)  Tenoretic 50 50-25 Mg  Tabs (Atenolol-Chlorthalidone) .... Take 1 Tablet By Mouth Once A Day 3)  Lipitor 20 Mg  Tabs (Atorvastatin Calcium) .... Take 1 Tablet By Mouth At Bedtime 4)  Allegra-D 12 Hour 60-120 Mg Xr12h-Tab (Fexofenadine-Pseudoephedrine) .... One By Mouth Bid 5)  Cortomycin 3.5-10000-1 Susp (Neomycin-Polymyxin-Hc) .... 4 Drops in Both Ears  Qid 6)  Klor-Con M10 10 Meq Cr-Tabs (Potassium Chloride Crys Cr) .... 0ne By Mouth Daily  Current Medications (verified): 1)  Multivitamins   Caps (Multiple Vitamin) .... Once Daily 2)  Tenoretic 50 50-25 Mg  Tabs (Atenolol-Chlorthalidone) .... Take 1 Tablet By Mouth Once A Day 3)  Lipitor 20 Mg  Tabs (Atorvastatin Calcium) .... Take 1 Tablet By Mouth At Bedtime 4)  Allegra-D 12 Hour 60-120 Mg Xr12h-Tab (Fexofenadine-Pseudoephedrine) .... One By Mouth Bid 5)  Klor-Con M10 10 Meq Cr-Tabs (Potassium Chloride Crys Cr) .... 0ne By Mouth Daily 6)  Doxycycline Hyclate 100 Mg Caps (Doxycycline Hyclate) .... One By Mouth Twice A Day  Allergies (verified): 1)  ! Septra 2)  ! 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  Past Medical History: Reviewed history from 01/19/2008 and no changes required. Allergic rhinitis Hyperlipidemia Hypertension  ? Crohns colitis  Past Surgical History: Reviewed history from 01/19/2008 and no changes required. Appendectomy Hysterectomy Oophorectomy partial  Family History: Reviewed history from 11/03/2007 and no changes required. Family History of CAD Female 1st degree relative <50 CABG Family History Hypertension mother uterine cancer  Social History: Reviewed history from 01/19/2008 and no changes required. 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    Physical Exam  General:  healthy appearing and obese.   Head:  normocephalic.  no abnormalities palpated.   Eyes:  no icterus.pupils equal, pupils round, and pupils reactive to light.   Ears:  R ear normal and L ear normal.   Nose:  no external  deformity and nasal dischargemucosal pallor.   Mouth:  pharynx pink and moist and posterior lymphoid hypertrophy.   Neck:  Supple; no masses or thyromegaly. Breasts:  L breast tender and L breast erythematous.   Lungs:  normal respiratory effort and no wheezes.   Heart:  normal rate and regular rhythm.   Abdomen:  Bowel sounds positive,abdomen soft and non-tender without masses, organomegaly or hernias noted. Msk:  No deformity or scoliosis noted of thoracic or lumbar spine.   Pulses:  R and L carotid,radial,femoral,dorsalis pedis and posterior tibial pulses are full and equal bilaterally Extremities:  No clubbing, cyanosis, edema, or deformity noted with normal full range of motion of all joints.   Neurologic:  No cranial nerve deficits noted. Station and gait are normal. Plantar reflexes are down-going bilaterally. DTRs are symmetrical throughout. Sensory, motor and coordinative functions appear intact. Skin:   S. pustular inflammation at 6 PM underneath the left breast nipple. Cervical Nodes:  No lymphadenopathy noted Axillary Nodes:  No palpable lymphadenopathy   Impression & Recommendations:  Problem # 1:  MORBID OBESITY (ICD-278.01) Assessment Improved  weight loss and exercise the patient has reduced her BMI to the point where she is no longer considered morbidly obese Ht: 63 (11/18/2010)   Wt: 221 (11/18/2010)   BMI: 39.29 (11/18/2010)  current goal is to  reach 200 pounds by next visit  Problem # 2:  PHYSICAL EXAMINATION (ICD-V70.0) Assessment: Unchanged The pt was asked about all immunizations, health maint. services that are appropriate to their age and was given guidance on diet exercize  and weight management  Mammogram: normal (01/17/2009) Pap smear: normal (11/12/2009) Colonoscopy: Location:  [facility].   (03/10/2008) Td Booster: Tdap (10/20/2005)   Flu Vax: Historical (08/24/2009)   Chol: 106 (11/11/2010)   HDL: 34.90 (11/11/2010)   LDL: 54 (11/11/2010)    TG: 87.0 (11/11/2010) TSH: 1.12 (11/11/2010)   Next mammogram due:: 01/2010 (11/12/2009) Next Colonoscopy due:: 02/2018 (03/28/2008)  Discussed using sunscreen, use of alcohol, drug use, self breast exam, routine dental care, routine eye care, schedule for GYN exam, routine physical exam, seat belts, multiple vitamins, osteoporosis prevention, adequate calcium intake in diet, recommendations for immunizations, mammograms and Pap smears.  Discussed exercise and checking cholesterol.  Discussed gun safety, safe sex, and contraception.  Problem # 3:  HYPERTENSION (ICD-401.9) Assessment: Improved  Her updated medication list for this problem includes:    Tenoretic 50 50-25 Mg Tabs (Atenolol-chlorthalidone) .Marland Kitchen... Take 1 tablet by mouth once a day  BP today: 120/80 Prior BP: 124/80 (05/09/2010)  Prior 10 Yr Risk Heart Disease: 8 % (05/09/2010)  Labs Reviewed: K+: 4.7 (11/11/2010) Creat: : 1.0 (11/11/2010)   Chol: 106 (11/11/2010)   HDL: 34.90 (11/11/2010)   LDL: 54 (11/11/2010)   TG: 87.0 (11/11/2010)  Problem # 4:  HYPERLIPIDEMIA (ICD-272.4) Assessment: Improved  Her updated medication list for this problem includes:    Lipitor 20 Mg Tabs (Atorvastatin calcium) .Marland Kitchen... Take 1 tablet by mouth at bedtime  Labs Reviewed: SGOT: 19 (11/11/2010)   SGPT: 18 (11/11/2010)  Lipid Goals: Chol Goal: 200 (11/03/2007)   HDL Goal: 40 (11/03/2007)   LDL Goal: 130 (11/03/2007)   TG Goal: 150 (11/03/2007)  Prior 10 Yr Risk Heart Disease: 8 % (05/09/2010)   HDL:34.90 (11/11/2010), 38.30 (10/23/2009)  LDL:54 (11/11/2010), 68 (10/23/2009)  Chol:106 (11/11/2010), 132 (10/23/2009)  Trig:87.0 (11/11/2010), 130.0 (10/23/2009)  Problem # 5:  MASTITIS (ICD-611.0) Assessment: New  pustular inflammation of the left breast at approximately 6 PM below the nipple. With risk of MR SA due to occupation even though this appears to be a staph infection will treat with doxycycline 100 twice a day for 14 days and  instructed patient that if the skin changes did not improve dramatically that we would refer her differential diagnosis does include other causes of inflammatory mastitis and if resolution is not apparent neoplastic evaluation would be initiated.  Complete Medication List: 1)  Multivitamins Caps (Multiple vitamin) .... Once daily 2)  Tenoretic 50 50-25 Mg Tabs (Atenolol-chlorthalidone) .... Take 1 tablet by mouth once a day 3)  Lipitor 20 Mg Tabs (Atorvastatin calcium) .... Take 1 tablet by mouth at bedtime 4)  Allegra-d 12 Hour 60-120 Mg Xr12h-tab (Fexofenadine-pseudoephedrine) .... One by mouth bid 5)  Klor-con M10 10 Meq Cr-tabs (Potassium chloride crys cr) .... 0ne by mouth daily 6)  Doxycycline Hyclate 100 Mg Caps (Doxycycline hyclate) .... One by mouth twice a day  Patient Instructions: 1)  Please schedule a follow-up appointment in 4-6 months. Prescriptions: DOXYCYCLINE HYCLATE 100 MG CAPS (DOXYCYCLINE HYCLATE) one by mouth twice a day  #28 x 0   Entered and Authorized by:   Ricard Dillon MD   Signed by:   Ricard Dillon MD on 11/18/2010   Method used:   Electronically to  [facility] (retail)       [address]       [place], [place]  [address]       Ph: [phone]       Fax: [phone]   RxID:   [id number] KLOR-CON M10 10 MEQ CR-TABS (POTASSIUM CHLORIDE CRYS CR) 0ne by mouth daily  #30 x 11   Entered by:   Allyne Gee, LPN   Authorized by:   Ricard Dillon MD   Signed by:   Allyne Gee, LPN on 45/80/9983   Method used:   Electronically to        [facility] (retail)       [address]       [place], [place]  [address]       Ph: [phone]       Fax: [phone]   RxID:   [id number] ALLEGRA-D 12 HOUR 60-120 MG XR12H-TAB (FEXOFENADINE-PSEUDOEPHEDRINE) one by mouth BID  #90 x 3   Entered by:   Allyne Gee, LPN    Authorized by:   Ricard Dillon MD   Signed by:   Allyne Gee, LPN on 22/97/9892   Method used:   Electronically to        [facility] (retail)       [address]       [place], [place]  [address]       Ph: [phone]       Fax: [phone]   RxID:   [id number] LIPITOR 20 MG  TABS (ATORVASTATIN CALCIUM) Take 1 tablet by mouth at bedtime  #90 x 3   Entered by:   Allyne Gee, LPN   Authorized by:   Ricard Dillon MD   Signed by:   Allyne Gee, LPN on 86/76/7209   Method used:   Electronically to        [facility] (retail)       [address]       [place], [place]  [address]       Ph: [phone]       Fax: [phone]   RxID:   [id number] TENORETIC 50 50-25 MG  TABS (ATENOLOL-CHLORTHALIDONE) Take 1 tablet by mouth once a day  #90 x 3   Entered by:   Allyne Gee, LPN   Authorized by:   Ricard Dillon MD   Signed by:   Allyne Gee, LPN on 96/75/9163   Method used:   Electronically to        [facility] (retail)       [address]       [place], [place]  [address]       Ph: [phone]       Fax: [phone]   RxID:   [id number]    Orders Added: 1)  Est. Patient 40-64 years [63893] 2)  Est. Patient Level IV [73428]     Preventive Care Screening  Pap Smear:    Next Due:  11/2011

## 2011-03-04 ENCOUNTER — Encounter: Payer: Self-pay | Admitting: Internal Medicine

## 2011-03-07 ENCOUNTER — Encounter: Payer: Self-pay | Admitting: Internal Medicine

## 2011-03-07 ENCOUNTER — Ambulatory Visit (INDEPENDENT_AMBULATORY_CARE_PROVIDER_SITE_OTHER): Payer: 59 | Admitting: Internal Medicine

## 2011-03-07 VITALS — BP 126/80 | HR 72 | Temp 98.2°F | Resp 14 | Ht 64.0 in | Wt 210.0 lb

## 2011-03-07 DIAGNOSIS — I10 Essential (primary) hypertension: Secondary | ICD-10-CM

## 2011-03-07 DIAGNOSIS — H60399 Other infective otitis externa, unspecified ear: Secondary | ICD-10-CM

## 2011-03-07 DIAGNOSIS — E669 Obesity, unspecified: Secondary | ICD-10-CM

## 2011-03-07 DIAGNOSIS — H6090 Unspecified otitis externa, unspecified ear: Secondary | ICD-10-CM

## 2011-03-07 DIAGNOSIS — E785 Hyperlipidemia, unspecified: Secondary | ICD-10-CM

## 2011-03-07 MED ORDER — OFLOXACIN 0.3 % OT SOLN: 5.0000 [drp] | Freq: Two times a day (BID) | OTIC | Status: AC

## 2011-03-07 NOTE — Progress Notes (Signed)
  Subjective:    Patient ID: Savannah Morrow, female    DOB: 1956/08/05, 55 y.o.   MRN: [id number]  HPI Patient is a 55 year old white female who presents for followup of hypertension hyperlipidemia and previous diagnoses of morbid obesity.  Her weight is up to 210 pounds and a body mass index now below 40 in the mid 30s she is in the obese range now not morbidly obese.  She has lost a total of 50 pounds and her weight loss efforts.  She denies any chest pain shortness of breath PND orthopnea she is tolerating her medications well she has a chief complaint of ear pain in the left ear.     Review of Systems  Constitutional: Negative for activity change, appetite change and fatigue.  HENT: Positive for ear pain, sinus pressure and ear discharge. Negative for congestion, neck pain and postnasal drip.   Eyes: Negative for redness and visual disturbance.  Respiratory: Negative for cough, shortness of breath and wheezing.   Gastrointestinal: Negative for abdominal pain and abdominal distention.  Genitourinary: Negative for dysuria, frequency and menstrual problem.  Musculoskeletal: Negative for myalgias, joint swelling and arthralgias.  Skin: Negative for rash and wound.  Neurological: Negative for dizziness, weakness and headaches.  Hematological: Negative for adenopathy. Does not bruise/bleed easily.  Psychiatric/Behavioral: Negative for sleep disturbance and decreased concentration.       Objective:   Physical Exam  Constitutional: She is oriented to person, place, and time. She appears well-developed and well-nourished. No distress.  HENT:  Head: Normocephalic and atraumatic.  Right Ear: External ear normal.  Left Ear: External ear normal.  Nose: Nose normal.  Mouth/Throat: Oropharynx is clear and moist.       Left ear canal and inflamed right ear canal and plan  Eyes: Conjunctivae and EOM are normal. Pupils are equal, round, and reactive to light.  Neck: Normal range of motion.  Neck supple. No JVD present. No tracheal deviation present. No thyromegaly present.  Cardiovascular: Normal rate, regular rhythm, normal heart sounds and intact distal pulses.   No murmur heard. Pulmonary/Chest: Effort normal and breath sounds normal. She has no wheezes. She exhibits no tenderness.  Abdominal: Soft. Bowel sounds are normal.  Musculoskeletal: Normal range of motion. She exhibits no edema and no tenderness.  Lymphadenopathy:    She has no cervical adenopathy.  Neurological: She is alert and oriented to person, place, and time. She has normal reflexes. No cranial nerve deficit.  Skin: Skin is warm and dry. She is not diaphoretic.  Psychiatric: She has a normal mood and affect. Her behavior is normal.          Assessment & Plan:  Obesity plan is being followed by the patient she has progressive weight loss she is now down total of 50 pounds.  Blood pressure has responded to this and excellent blood pressure today 126/80.  Lipid monitoring was not encompassed at this visit will be monitored in 4 months Treatment of otitis externa with Floxin otic drops prescription given to patient

## 2011-08-08 ENCOUNTER — Ambulatory Visit: Payer: 59 | Admitting: Internal Medicine

## 2011-11-13 ENCOUNTER — Other Ambulatory Visit (INDEPENDENT_AMBULATORY_CARE_PROVIDER_SITE_OTHER): Payer: 59

## 2011-11-13 DIAGNOSIS — Z0000 Encounter for general adult medical examination without abnormal findings: Secondary | ICD-10-CM

## 2011-11-13 LAB — BASIC METABOLIC PANEL
CO2: 31 mEq/L (ref 19–32)
GFR: 79.52 mL/min (ref >60.00)
Glucose, Bld: 97 mg/dL (ref 70–99)
Potassium: 3.9 mEq/L (ref 3.5–5.1)
Sodium: 143 mEq/L (ref 135–145)

## 2011-11-13 LAB — CBC WITH DIFFERENTIAL/PLATELET
Basophils Absolute: 0 10*3/uL (ref 0.0–0.1)
Basophils Relative: 0.1 % (ref 0.0–3.0)
HCT: 41.6 % (ref 36.0–46.0)
Hemoglobin: 14.2 g/dL (ref 12.0–15.0)
Lymphs Abs: 2 10*3/uL (ref 0.7–4.0)
MCHC: 34.3 g/dL (ref 30.0–36.0)
Monocytes Relative: 9.5 % (ref 3.0–12.0)
Neutro Abs: 3.6 10*3/uL (ref 1.4–7.7)
RBC: 4.67 Mil/uL (ref 3.87–5.11)
RDW: 13.4 % (ref 11.5–14.6)

## 2011-11-13 LAB — LIPID PANEL
Cholesterol: 127 mg/dL (ref 0–200)
VLDL: 22.4 mg/dL (ref 0.0–40.0)

## 2011-11-13 LAB — POCT URINALYSIS DIPSTICK
Bilirubin, UA: NEGATIVE
Glucose, UA: NEGATIVE
Ketones, UA: NEGATIVE
Leukocytes, UA: NEGATIVE
Spec Grav, UA: 1.015

## 2011-11-13 LAB — HEPATIC FUNCTION PANEL
ALT: 27 U/L (ref 0–35)
AST: 21 U/L (ref 0–37)
Albumin: 4.1 g/dL (ref 3.5–5.2)
Total Protein: 7.4 g/dL (ref 6.0–8.3)

## 2011-11-13 LAB — TSH: TSH: 1.56 u[IU]/mL (ref 0.35–5.50)

## 2011-11-18 ENCOUNTER — Other Ambulatory Visit: Payer: Self-pay | Admitting: Internal Medicine

## 2011-11-18 DIAGNOSIS — Z1231 Encounter for screening mammogram for malignant neoplasm of breast: Secondary | ICD-10-CM

## 2011-11-20 ENCOUNTER — Ambulatory Visit (INDEPENDENT_AMBULATORY_CARE_PROVIDER_SITE_OTHER): Payer: 59 | Admitting: Internal Medicine

## 2011-11-20 ENCOUNTER — Encounter: Payer: Self-pay | Admitting: Internal Medicine

## 2011-11-20 DIAGNOSIS — I10 Essential (primary) hypertension: Secondary | ICD-10-CM

## 2011-11-20 DIAGNOSIS — E669 Obesity, unspecified: Secondary | ICD-10-CM

## 2011-11-20 DIAGNOSIS — Z0000 Encounter for general adult medical examination without abnormal findings: Secondary | ICD-10-CM

## 2011-11-20 MED ORDER — ATORVASTATIN CALCIUM 20 MG PO TABS: 20.0000 mg | ORAL_TABLET | Freq: Every day | ORAL | Status: DC

## 2011-11-20 MED ORDER — ATENOLOL-CHLORTHALIDONE 50-25 MG PO TABS: 1.0000 | ORAL_TABLET | Freq: Every day | ORAL | Status: DC

## 2011-11-20 MED ORDER — POTASSIUM CHLORIDE ER 10 MEQ PO TBCR: 10.0000 meq | EXTENDED_RELEASE_TABLET | Freq: Every day | ORAL | Status: DC

## 2011-11-20 NOTE — Patient Instructions (Signed)
The patient is instructed to continue all medications as prescribed. Schedule followup with check out clerk upon leaving the clinic  

## 2011-11-20 NOTE — Progress Notes (Signed)
Subjective:    Patient ID: Savannah Morrow, female    DOB: 04/20/1956, 56 y.o.   MRN: [id number]  HPI Patient is a 56 year old white female who is followed for multiple medical problems and presents today for complete physical examination.  Her comorbid problems are followed today include obesity hyperlipidemia hypertension and a history of chronic cerumen impaction.  She has a chief complaint today of persistent ear irritation. She admits to gaining 10 pounds since her last office visit but has lost 5 pounds recently as she has reactivated her diet and exercise program   Review of Systems  Constitutional: Negative for activity change, appetite change and fatigue.  HENT: Negative for ear pain, congestion, neck pain, postnasal drip and sinus pressure.   Eyes: Negative for redness and visual disturbance.  Respiratory: Negative for cough, shortness of breath and wheezing.   Gastrointestinal: Negative for abdominal pain and abdominal distention.  Genitourinary: Negative for dysuria, frequency and menstrual problem.  Musculoskeletal: Negative for myalgias, joint swelling and arthralgias.  Skin: Negative for rash and wound.  Neurological: Negative for dizziness, weakness and headaches.  Hematological: Negative for adenopathy. Does not bruise/bleed easily.  Psychiatric/Behavioral: Negative for sleep disturbance and decreased concentration.   Past Medical History  Diagnosis Date  . Allergy   . Hyperlipidemia   . Hypertension   . Crohn's disease     History   Social History  . Marital Status: Married    Spouse Name: N/A    Number of Children: N/A  . Years of Education: N/A   Occupational History  . Not on file.   Social History Main Topics  . Smoking status: Never Smoker   . Smokeless tobacco: Not on file  . Alcohol Use: No  . Drug Use: No  . Sexually Active: Yes   Other Topics Concern  . Not on file   Social History Narrative  . No narrative on file    Past Surgical  History  Procedure Date  . Appendectomy   . Abdominal hysterectomy   . Oophorectomy     Family History  Problem Relation Age of Onset  . Coronary artery disease    . Uterine cancer Mother   . Alzheimer's disease Mother   . Heart disease Father   . Hypertension Father   . Cancer Father     b cell lymphoma    Allergies  Allergen Reactions  . Meperidine Hcl     REACTION: unspecified  . Sulfamethoxazole W/Trimethoprim     REACTION: unspecified    Current Outpatient Prescriptions on File Prior to Visit  Medication Sig Dispense Refill  . multivitamin (THERAGRAN) per tablet Take 1 tablet by mouth daily.        . potassium chloride (KLOR-CON) 10 MEQ CR tablet Take 10 mEq by mouth daily.          BP 146/80  Pulse 72  Temp 98.2 F (36.8 C)  Resp 16  Ht 5' 4"  (1.626 m)  Wt 214 lb (97.07 kg)  BMI 36.73 kg/m2       Objective:   Physical Exam  Nursing note reviewed. Constitutional: She is oriented to person, place, and time. She appears well-developed and well-nourished. No distress.  HENT:  Head: Normocephalic and atraumatic.  Right Ear: External ear normal.  Left Ear: External ear normal.  Nose: Nose normal.  Mouth/Throat: Oropharynx is clear and moist.  Eyes: Conjunctivae and EOM are normal. Pupils are equal, round, and reactive to light.  Neck: Normal range of  motion. Neck supple. No JVD present. No tracheal deviation present. No thyromegaly present.  Cardiovascular: Normal rate, regular rhythm, normal heart sounds and intact distal pulses.   No murmur heard. Pulmonary/Chest: Effort normal and breath sounds normal. She has no wheezes. She exhibits no tenderness.  Abdominal: Soft. Bowel sounds are normal.  Musculoskeletal: Normal range of motion. She exhibits no edema and no tenderness.  Lymphadenopathy:    She has no cervical adenopathy.  Neurological: She is alert and oriented to person, place, and time. She has normal reflexes. No cranial nerve deficit.  Skin:  Skin is warm and dry. She is not diaphoretic.  Psychiatric: She has a normal mood and affect. Her behavior is normal.          Assessment & Plan:   This is a routine physical examination for this healthy  Female. Reviewed all health maintenance protocols including mammography colonoscopy bone density and reviewed appropriate screening labs. Her immunization history was reviewed as well as her current medications and allergies refills of her chronic medications were given and the plan for yearly health maintenance was discussed all orders and referrals were made as appropriate. We reviewed her risks for this metabolic syndrome currently her fasting blood glucose has normalized.  I believe this is due to the metabolic cycle but she is in at this time.  She is losing weight and that allows her body to be able to control her blood glucose we went over in detail this process with her son she is an Therapist, sports.  We have set a weight goal of 199 pounds prior to the next office visit   Blood pressure is moderately elevated due to anxiety over her weight gain she states her home blood pressures are in the 130/80 range we will continue to monitor

## 2011-11-26 ENCOUNTER — Ambulatory Visit
Admission: RE | Admit: 2011-11-26 | Discharge: 2011-11-26 | Disposition: A | Discharge status: Home / Self Care | Payer: 59 | Source: Ambulatory Visit | Attending: Internal Medicine | Admitting: Internal Medicine

## 2011-11-26 DIAGNOSIS — Z1231 Encounter for screening mammogram for malignant neoplasm of breast: Secondary | ICD-10-CM

## 2012-03-19 ENCOUNTER — Ambulatory Visit: Payer: 59 | Admitting: Internal Medicine

## 2012-06-14 ENCOUNTER — Ambulatory Visit: Payer: 59 | Admitting: Internal Medicine

## 2012-07-21 ENCOUNTER — Ambulatory Visit: Payer: 59 | Admitting: Internal Medicine

## 2012-11-11 ENCOUNTER — Other Ambulatory Visit: Payer: Self-pay | Admitting: *Deleted

## 2012-11-11 ENCOUNTER — Encounter: Payer: Self-pay | Admitting: Internal Medicine

## 2012-11-11 MED ORDER — ATENOLOL-CHLORTHALIDONE 50-25 MG PO TABS: 1.0000 | ORAL_TABLET | Freq: Every day | ORAL | Status: DC

## 2012-11-11 MED ORDER — POTASSIUM CHLORIDE ER 10 MEQ PO TBCR: 10.0000 meq | EXTENDED_RELEASE_TABLET | Freq: Every day | ORAL | Status: DC

## 2012-11-11 MED ORDER — ATORVASTATIN CALCIUM 20 MG PO TABS: 20.0000 mg | ORAL_TABLET | Freq: Every day | ORAL | Status: DC

## 2012-11-15 ENCOUNTER — Other Ambulatory Visit: Payer: Self-pay | Admitting: Internal Medicine

## 2012-11-15 DIAGNOSIS — Z1231 Encounter for screening mammogram for malignant neoplasm of breast: Secondary | ICD-10-CM

## 2012-11-17 ENCOUNTER — Other Ambulatory Visit: Payer: 59

## 2012-11-23 ENCOUNTER — Encounter: Payer: 59 | Admitting: Internal Medicine

## 2012-11-30 ENCOUNTER — Ambulatory Visit
Admission: RE | Admit: 2012-11-30 | Discharge: 2012-11-30 | Disposition: A | Discharge status: Home / Self Care | Payer: 59 | Source: Ambulatory Visit | Attending: Internal Medicine | Admitting: Internal Medicine

## 2012-11-30 DIAGNOSIS — Z1231 Encounter for screening mammogram for malignant neoplasm of breast: Secondary | ICD-10-CM

## 2012-12-04 ENCOUNTER — Other Ambulatory Visit: Payer: Self-pay

## 2012-12-05 ENCOUNTER — Encounter: Payer: Self-pay | Admitting: Internal Medicine

## 2012-12-08 ENCOUNTER — Encounter: Payer: Self-pay | Admitting: Internal Medicine

## 2013-02-28 ENCOUNTER — Other Ambulatory Visit (INDEPENDENT_AMBULATORY_CARE_PROVIDER_SITE_OTHER): Payer: 59

## 2013-02-28 DIAGNOSIS — Z0000 Encounter for general adult medical examination without abnormal findings: Secondary | ICD-10-CM

## 2013-02-28 LAB — HEPATIC FUNCTION PANEL
ALT: 21 U/L (ref 0–35)
Albumin: 3.9 g/dL (ref 3.5–5.2)
Alkaline Phosphatase: 73 U/L (ref 39–117)
Bilirubin, Direct: 0 mg/dL (ref 0.0–0.3)
Total Protein: 7.8 g/dL (ref 6.0–8.3)

## 2013-02-28 LAB — POCT URINALYSIS DIPSTICK
Ketones, UA: NEGATIVE
Protein, UA: NEGATIVE
Spec Grav, UA: 1.02
Urobilinogen, UA: 0.2
pH, UA: 5

## 2013-02-28 LAB — CBC WITH DIFFERENTIAL/PLATELET
Basophils Relative: 0.4 % (ref 0.0–3.0)
Eosinophils Absolute: 0.3 10*3/uL (ref 0.0–0.7)
Eosinophils Relative: 4.5 % (ref 0.0–5.0)
Hemoglobin: 14.5 g/dL (ref 12.0–15.0)
MCHC: 34.2 g/dL (ref 30.0–36.0)
MCV: 86.6 fl (ref 78.0–100.0)
Monocytes Absolute: 0.6 10*3/uL (ref 0.1–1.0)
Neutro Abs: 4.7 10*3/uL (ref 1.4–7.7)
Neutrophils Relative %: 61.2 % (ref 43.0–77.0)
RBC: 4.89 Mil/uL (ref 3.87–5.11)
WBC: 7.6 10*3/uL (ref 4.5–10.5)

## 2013-02-28 LAB — BASIC METABOLIC PANEL
CO2: 29 mEq/L (ref 19–32)
Chloride: 102 mEq/L (ref 96–112)
Potassium: 4.2 mEq/L (ref 3.5–5.1)
Sodium: 141 mEq/L (ref 135–145)

## 2013-02-28 LAB — LIPID PANEL
LDL Cholesterol: 63 mg/dL (ref 0–99)
VLDL: 23.2 mg/dL (ref 0.0–40.0)

## 2013-03-07 ENCOUNTER — Encounter: Payer: Self-pay | Admitting: Internal Medicine

## 2013-03-07 ENCOUNTER — Ambulatory Visit (INDEPENDENT_AMBULATORY_CARE_PROVIDER_SITE_OTHER): Payer: 59 | Admitting: Internal Medicine

## 2013-03-07 ENCOUNTER — Other Ambulatory Visit (HOSPITAL_COMMUNITY)
Admission: RE | Admit: 2013-03-07 | Discharge: 2013-03-07 | Disposition: A | Discharge status: Home / Self Care | Payer: 59 | Source: Ambulatory Visit | Attending: Internal Medicine | Admitting: Internal Medicine

## 2013-03-07 VITALS — BP 132/80 | HR 76 | Temp 98.2°F | Resp 16 | Ht 64.0 in | Wt 226.0 lb

## 2013-03-07 DIAGNOSIS — Z01419 Encounter for gynecological examination (general) (routine) without abnormal findings: Secondary | ICD-10-CM | POA: Insufficient documentation

## 2013-03-07 DIAGNOSIS — Z0000 Encounter for general adult medical examination without abnormal findings: Secondary | ICD-10-CM

## 2013-03-07 DIAGNOSIS — R7301 Impaired fasting glucose: Secondary | ICD-10-CM

## 2013-03-07 LAB — HEMOGLOBIN A1C: Hgb A1c MFr Bld: 5.7 % (ref 4.6–6.5)

## 2013-03-07 NOTE — Progress Notes (Signed)
Subjective:    Patient ID: Savannah Morrow, female    DOB: January 06, 1956, 57 y.o.   MRN: [id number]  HPI CPX Under stress with death of father and care for mother  Elevated weight and elevated glucose with high DM risk      Review of Systems  Constitutional: Negative for activity change, appetite change and fatigue.  HENT: Negative for ear pain, congestion, neck pain, postnasal drip and sinus pressure.   Eyes: Negative for redness and visual disturbance.  Respiratory: Negative for cough, shortness of breath and wheezing.   Gastrointestinal: Negative for abdominal pain and abdominal distention.  Genitourinary: Negative for dysuria, frequency and menstrual problem.  Musculoskeletal: Negative for myalgias, joint swelling and arthralgias.  Skin: Negative for rash and wound.  Neurological: Negative for dizziness, weakness and headaches.  Hematological: Negative for adenopathy. Does not bruise/bleed easily.  Psychiatric/Behavioral: Negative for sleep disturbance and decreased concentration.   Past Medical History  Diagnosis Date  . Allergy   . Hyperlipidemia   . Hypertension   . Crohn's disease     History   Social History  . Marital Status: Married    Spouse Name: N/A    Number of Children: N/A  . Years of Education: N/A   Occupational History  . Not on file.   Social History Main Topics  . Smoking status: Never Smoker   . Smokeless tobacco: Not on file  . Alcohol Use: No  . Drug Use: No  . Sexually Active: Yes   Other Topics Concern  . Not on file   Social History Narrative  . No narrative on file    Past Surgical History  Procedure Laterality Date  . Appendectomy    . Abdominal hysterectomy    . Oophorectomy      Family History  Problem Relation Age of Onset  . Coronary artery disease    . Uterine cancer Mother   . Alzheimer's disease Mother   . Heart disease Father   . Hypertension Father   . Cancer Father     b cell lymphoma    Allergies   Allergen Reactions  . Meperidine Hcl     REACTION: unspecified  . Sulfamethoxazole W-Trimethoprim     REACTION: unspecified    Current Outpatient Prescriptions on File Prior to Visit  Medication Sig Dispense Refill  . atenolol-chlorthalidone (TENORETIC) 50-25 MG per tablet Take 1 tablet by mouth daily.  90 tablet  3  . atorvastatin (LIPITOR) 20 MG tablet Take 1 tablet (20 mg total) by mouth daily.  90 tablet  3  . loratadine (CLARITIN) 10 MG tablet Take 10 mg by mouth daily as needed.      . multivitamin (THERAGRAN) per tablet Take 1 tablet by mouth daily.        . potassium chloride (K-DUR) 10 MEQ tablet Take 1 tablet (10 mEq total) by mouth daily.  90 tablet  3   No current facility-administered medications on file prior to visit.    BP 132/80  Pulse 76  Temp(Src) 98.2 F (36.8 C)  Resp 16  Ht 5' 4"  (1.626 m)  Wt 226 lb (102.513 kg)  BMI 38.77 kg/m2        Objective:   Physical Exam  Nursing note and vitals reviewed. Constitutional: She is oriented to person, place, and time. She appears well-developed and well-nourished. No distress.  Weight gain   HENT:  Head: Normocephalic and atraumatic.  Right Ear: External ear normal.  Left Ear: External ear normal.  Nose: Nose normal.  Mouth/Throat: Oropharynx is clear and moist.  Eyes: Conjunctivae and EOM are normal. Pupils are equal, round, and reactive to light.  Neck: Normal range of motion. Neck supple. No JVD present. No tracheal deviation present. No thyromegaly present.  Cardiovascular: Normal rate and regular rhythm.   No murmur heard. Pulmonary/Chest: Effort normal and breath sounds normal. She has no wheezes. She exhibits no tenderness.  Abdominal: Soft. Bowel sounds are normal.  Musculoskeletal: Normal range of motion. She exhibits no edema and no tenderness.  Lymphadenopathy:    She has no cervical adenopathy.  Neurological: She is alert and oriented to person, place, and time. She has normal reflexes. No  cranial nerve deficit.  Skin: Skin is warm and dry. She is not diaphoretic.          Assessment & Plan:   This is a routine physical examination for this healthy  Female. Reviewed all health maintenance protocols including mammography colonoscopy bone density and reviewed appropriate screening labs. Her immunization history was reviewed as well as her current medications and allergies refills of her chronic medications were given and the plan for yearly health maintenance was discussed all orders and referrals were made as appropriate.   Well  Visit Stress Lipid management  At goal Slight elevation for glucose [facility] nutritionist A1c today 6 month follow up bmet

## 2013-03-07 NOTE — Addendum Note (Signed)
Addended by: Allyne Gee on: 03/07/2013 12:57 PM   Modules accepted: Orders

## 2013-08-24 ENCOUNTER — Encounter: Payer: Self-pay | Admitting: Internal Medicine

## 2013-08-31 ENCOUNTER — Other Ambulatory Visit: Payer: 59

## 2013-09-07 ENCOUNTER — Ambulatory Visit: Payer: 59 | Admitting: Internal Medicine

## 2013-11-14 ENCOUNTER — Other Ambulatory Visit: Payer: Self-pay | Admitting: Family Medicine

## 2013-11-14 ENCOUNTER — Encounter: Payer: Self-pay | Admitting: Internal Medicine

## 2013-11-14 MED ORDER — ATORVASTATIN CALCIUM 20 MG PO TABS: 20.0000 mg | ORAL_TABLET | Freq: Every day | ORAL | Status: DC

## 2013-11-14 MED ORDER — ATENOLOL-CHLORTHALIDONE 50-25 MG PO TABS: 1.0000 | ORAL_TABLET | Freq: Every day | ORAL | Status: DC

## 2013-11-14 MED ORDER — POTASSIUM CHLORIDE ER 10 MEQ PO TBCR: 10.0000 meq | EXTENDED_RELEASE_TABLET | Freq: Every day | ORAL | Status: DC

## 2013-12-09 ENCOUNTER — Other Ambulatory Visit (INDEPENDENT_AMBULATORY_CARE_PROVIDER_SITE_OTHER): Payer: 59

## 2013-12-09 DIAGNOSIS — R7301 Impaired fasting glucose: Secondary | ICD-10-CM

## 2013-12-09 LAB — BASIC METABOLIC PANEL WITH GFR
BUN: 15 mg/dL (ref 6–23)
CO2: 32 meq/L (ref 19–32)
Calcium: 9.4 mg/dL (ref 8.4–10.5)
Chloride: 100 meq/L (ref 96–112)
Creatinine, Ser: 0.8 mg/dL (ref 0.4–1.2)
GFR: 78.96 mL/min
Glucose, Bld: 100 mg/dL — ABNORMAL HIGH (ref 70–99)
Potassium: 4.4 meq/L (ref 3.5–5.1)
Sodium: 142 meq/L (ref 135–145)

## 2013-12-16 ENCOUNTER — Encounter: Payer: Self-pay | Admitting: Internal Medicine

## 2013-12-16 ENCOUNTER — Ambulatory Visit (INDEPENDENT_AMBULATORY_CARE_PROVIDER_SITE_OTHER): Payer: 59 | Admitting: Internal Medicine

## 2013-12-16 VITALS — BP 130/80 | HR 80 | Temp 98.2°F | Resp 16 | Ht 64.0 in | Wt 248.0 lb

## 2013-12-16 DIAGNOSIS — E669 Obesity, unspecified: Secondary | ICD-10-CM

## 2013-12-16 DIAGNOSIS — I10 Essential (primary) hypertension: Secondary | ICD-10-CM

## 2013-12-16 DIAGNOSIS — E785 Hyperlipidemia, unspecified: Secondary | ICD-10-CM

## 2013-12-16 NOTE — Progress Notes (Signed)
Subjective:    Patient ID: Savannah Morrow, female    DOB: September 11, 1956, 58 y.o.   MRN: [id number]  Hypertension Pertinent negatives include no headaches, neck pain or shortness of breath.  Hyperlipidemia Pertinent negatives include no myalgias or shortness of breath.    Stress increased and has not been able to loose weight  CBG's steady and diet has improved Lack of exercise is the key issue "pre diabetes"  With high risk   Review of Systems  Constitutional: Negative for activity change, appetite change and fatigue.  HENT: Negative for congestion, ear pain, postnasal drip and sinus pressure.   Eyes: Negative for redness and visual disturbance.  Respiratory: Negative for cough, shortness of breath and wheezing.   Gastrointestinal: Negative for abdominal pain and abdominal distention.  Genitourinary: Negative for dysuria, frequency and menstrual problem.  Musculoskeletal: Negative for arthralgias, joint swelling, myalgias and neck pain.  Skin: Negative for rash and wound.  Neurological: Negative for dizziness, weakness and headaches.  Hematological: Negative for adenopathy. Does not bruise/bleed easily.  Psychiatric/Behavioral: Negative for sleep disturbance and decreased concentration.   Past Medical History  Diagnosis Date  . Allergy   . Hyperlipidemia   . Hypertension   . Crohn's disease     History   Social History  . Marital Status: Married    Spouse Name: N/A    Number of Children: N/A  . Years of Education: N/A   Occupational History  . Not on file.   Social History Main Topics  . Smoking status: Never Smoker   . Smokeless tobacco: Not on file  . Alcohol Use: No  . Drug Use: No  . Sexual Activity: Yes   Other Topics Concern  . Not on file   Social History Narrative  . No narrative on file    Past Surgical History  Procedure Laterality Date  . Appendectomy    . Abdominal hysterectomy    . Oophorectomy      Family History  Problem Relation Age  of Onset  . Coronary artery disease    . Uterine cancer Mother   . Alzheimer's disease Mother   . Heart disease Father   . Hypertension Father   . Cancer Father     b cell lymphoma    Allergies  Allergen Reactions  . Meperidine Hcl     REACTION: unspecified  . Sulfamethoxazole-Trimethoprim     REACTION: unspecified    Current Outpatient Prescriptions on File Prior to Visit  Medication Sig Dispense Refill  . atenolol-chlorthalidone (TENORETIC) 50-25 MG per tablet Take 1 tablet by mouth daily.  90 tablet  0  . atorvastatin (LIPITOR) 20 MG tablet Take 1 tablet (20 mg total) by mouth daily.  90 tablet  0  . loratadine (CLARITIN) 10 MG tablet Take 10 mg by mouth daily as needed.      . multivitamin (THERAGRAN) per tablet Take 1 tablet by mouth daily.        . potassium chloride (K-DUR) 10 MEQ tablet Take 1 tablet (10 mEq total) by mouth daily.  90 tablet  0   No current facility-administered medications on file prior to visit.    BP 130/80  Pulse 80  Temp(Src) 98.2 F (36.8 C)  Resp 16  Ht 4' (1.219 m)  Wt 248 lb (112.492 kg)  BMI 75.70 kg/m2        Objective:   Physical Exam  Nursing note and vitals reviewed. Constitutional: She is oriented to person, place, and time.  She appears well-nourished.  HENT:  Head: Normocephalic and atraumatic.  Eyes: Conjunctivae are normal. Pupils are equal, round, and reactive to light.  Cardiovascular: Normal rate and regular rhythm.   Pulmonary/Chest: Effort normal and breath sounds normal.  Abdominal: Soft. Bowel sounds are normal.  Neurological: She is alert and oriented to person, place, and time.  Skin: Skin is warm and dry.          Assessment & Plan:  Exercise and weight loss needed Diet is improved Goal set  I have spent more than 30 minutes examining this patient face-to-face of which over half was spent in counseling

## 2013-12-16 NOTE — Patient Instructions (Signed)
The patient is instructed to continue all medications as prescribed. Schedule followup with check out clerk upon leaving the clinic   Obesity Obesity is defined as having too much total body fat and a body mass index (BMI) of 30 or more. BMI is an estimate of body fat and is calculated from your height and weight. Obesity happens when you consume more calories than you can burn by exercising or performing daily physical tasks. Prolonged obesity can cause major illnesses or emergencies, such as:   A stroke.  Heart disease.  Diabetes.  Cancer.  Arthritis.  High blood pressure (hypertension).  High cholesterol.  Sleep apnea.  Erectile dysfunction.  Infertility problems. CAUSES   Regularly eating unhealthy foods.  Physical inactivity.  Certain disorders, such as an underactive thyroid (hypothyroidism),   Genetics.  Lack of sleep. DIAGNOSIS  A caregiver can diagnose obesity after calculating your BMI. Obesity will be diagnosed if your BMI is 30 or higher.  There are other methods of measuring obesity levels. Some other methods include measuring your skin fold thickness, your waist circumference, and comparing your hip circumference to your waist circumference. TREATMENT  A healthy treatment program includes some or all of the following:  Long-term dietary changes.  Exercise and physical activity.  Behavioral and lifestyle changes.  Medicine only under the supervision of your caregiver. Medicines may help, but only if they are used with diet and exercise programs. An unhealthy treatment program includes:  Fasting.  Fad diets.  Supplements and drugs. These choices do not succeed in long-term weight control.  HOME CARE INSTRUCTIONS   Exercise and perform physical activity as directed by your caregiver. To increase physical activity, try the following:  Use stairs instead of elevators.  Park farther away from store entrances.  Garden, bike, or walk instead of  watching television or using the computer.  Eat healthy, low-calorie foods and drinks on a regular basis. Eat more fruits and vegetables. Use low-calorie cookbooks or take healthy cooking classes.  Limit fast food, sweets, and processed snack foods.  Eat smaller portions.  Keep a daily journal of everything you eat. There are many free websites to help you with this. It may be helpful to measure your foods so you can determine if you are eating the correct portion sizes.  Avoid drinking alcohol. Drink more water and drinks without calories.  Take vitamins and supplements only as recommended by your caregiver.  Weight-loss support groups, Nurse, mental health, counselors, and stress reduction education can also be very helpful. SEEK IMMEDIATE MEDICAL CARE IF:  You have chest pain or tightness.  You have trouble breathing or feel short of breath.  You have weakness or leg numbness.  You feel confused or have trouble talking.  You have sudden changes in your vision. MAKE SURE YOU:  Understand these instructions.  Will watch your condition.  Will get help right away if you are not doing well or get worse. Document Released: 11/13/2004 Document Revised: 04/06/2012 Document Reviewed: 11/12/2011 [facility] Patient Information 2014 [place].

## 2013-12-16 NOTE — Progress Notes (Signed)
Pre visit review using our clinic review tool, if applicable. No additional management support is needed unless otherwise documented below in the visit note. 

## 2013-12-19 ENCOUNTER — Telehealth: Payer: Self-pay | Admitting: Internal Medicine

## 2013-12-19 NOTE — Telephone Encounter (Signed)
Relevant patient education assigned to patient using Emmi. ° °

## 2014-02-21 ENCOUNTER — Other Ambulatory Visit: Payer: Self-pay | Admitting: Internal Medicine

## 2014-02-21 ENCOUNTER — Encounter: Payer: Self-pay | Admitting: Internal Medicine

## 2014-02-22 MED ORDER — ATORVASTATIN CALCIUM 20 MG PO TABS: 20.0000 mg | ORAL_TABLET | Freq: Every day | ORAL | Status: DC

## 2014-02-22 MED ORDER — POTASSIUM CHLORIDE ER 10 MEQ PO TBCR: 10.0000 meq | EXTENDED_RELEASE_TABLET | Freq: Every day | ORAL | Status: DC

## 2014-02-22 MED ORDER — ATENOLOL-CHLORTHALIDONE 50-25 MG PO TABS: 1.0000 | ORAL_TABLET | Freq: Every day | ORAL | Status: DC

## 2014-02-24 MED ORDER — ATORVASTATIN CALCIUM 20 MG PO TABS: 20.0000 mg | ORAL_TABLET | Freq: Every day | ORAL | Status: DC

## 2014-02-24 MED ORDER — POTASSIUM CHLORIDE ER 10 MEQ PO TBCR: 10.0000 meq | EXTENDED_RELEASE_TABLET | Freq: Every day | ORAL | Status: DC

## 2014-02-24 MED ORDER — ATENOLOL-CHLORTHALIDONE 50-25 MG PO TABS: 1.0000 | ORAL_TABLET | Freq: Every day | ORAL | Status: DC

## 2014-03-06 ENCOUNTER — Other Ambulatory Visit (INDEPENDENT_AMBULATORY_CARE_PROVIDER_SITE_OTHER): Payer: 59

## 2014-03-06 DIAGNOSIS — Z0000 Encounter for general adult medical examination without abnormal findings: Secondary | ICD-10-CM

## 2014-03-06 LAB — BASIC METABOLIC PANEL
BUN: 18 mg/dL (ref 6–23)
CHLORIDE: 101 meq/L (ref 96–112)
CO2: 30 meq/L (ref 19–32)
CREATININE: 0.7 mg/dL (ref 0.4–1.2)
Calcium: 9.3 mg/dL (ref 8.4–10.5)
GFR: 85.07 mL/min (ref >60.00)
Glucose, Bld: 95 mg/dL (ref 70–99)
Potassium: 3.8 mEq/L (ref 3.5–5.1)
Sodium: 142 mEq/L (ref 135–145)

## 2014-03-06 LAB — HEPATIC FUNCTION PANEL
ALT: 22 U/L (ref 0–35)
AST: 23 U/L (ref 0–37)
Albumin: 4 g/dL (ref 3.5–5.2)
Alkaline Phosphatase: 66 U/L (ref 39–117)
Bilirubin, Direct: 0.1 mg/dL (ref 0.0–0.3)
TOTAL PROTEIN: 7.4 g/dL (ref 6.0–8.3)
Total Bilirubin: 1.1 mg/dL (ref 0.2–1.2)

## 2014-03-06 LAB — CBC WITH DIFFERENTIAL/PLATELET
BASOS ABS: 0 10*3/uL (ref 0.0–0.1)
BASOS PCT: 0.4 % (ref 0.0–3.0)
EOS ABS: 0.3 10*3/uL (ref 0.0–0.7)
Eosinophils Relative: 3.9 % (ref 0.0–5.0)
HCT: 41.4 % (ref 36.0–46.0)
Hemoglobin: 13.9 g/dL (ref 12.0–15.0)
LYMPHS PCT: 24.4 % (ref 12.0–46.0)
Lymphs Abs: 1.8 10*3/uL (ref 0.7–4.0)
MCHC: 33.6 g/dL (ref 30.0–36.0)
MCV: 88.9 fl (ref 78.0–100.0)
MONO ABS: 0.7 10*3/uL (ref 0.1–1.0)
Monocytes Relative: 9 % (ref 3.0–12.0)
Neutro Abs: 4.7 10*3/uL (ref 1.4–7.7)
Neutrophils Relative %: 62.3 % (ref 43.0–77.0)
PLATELETS: 257 10*3/uL (ref 150.0–400.0)
RBC: 4.66 Mil/uL (ref 3.87–5.11)
RDW: 14.1 % (ref 11.5–15.5)
WBC: 7.5 10*3/uL (ref 4.0–10.5)

## 2014-03-06 LAB — LIPID PANEL
CHOLESTEROL: 128 mg/dL (ref 0–200)
HDL: 40.4 mg/dL (ref >39.00)
LDL Cholesterol: 65 mg/dL (ref 0–99)
TRIGLYCERIDES: 113 mg/dL (ref 0.0–149.0)
Total CHOL/HDL Ratio: 3
VLDL: 22.6 mg/dL (ref 0.0–40.0)

## 2014-03-06 LAB — POCT URINALYSIS DIPSTICK
Bilirubin, UA: NEGATIVE
Blood, UA: NEGATIVE
GLUCOSE UA: NEGATIVE
Ketones, UA: NEGATIVE
Leukocytes, UA: NEGATIVE
Nitrite, UA: NEGATIVE
PROTEIN UA: NEGATIVE
Spec Grav, UA: 1.025
UROBILINOGEN UA: 0.2
pH, UA: 5.5

## 2014-03-06 LAB — TSH: TSH: 1.88 u[IU]/mL (ref 0.35–4.50)

## 2014-03-08 ENCOUNTER — Encounter: Payer: Self-pay | Admitting: Internal Medicine

## 2014-03-15 ENCOUNTER — Ambulatory Visit (INDEPENDENT_AMBULATORY_CARE_PROVIDER_SITE_OTHER): Payer: 59 | Admitting: Family

## 2014-03-15 VITALS — BP 128/80 | HR 77 | Ht 62.5 in | Wt 250.0 lb

## 2014-03-15 DIAGNOSIS — Z2911 Encounter for prophylactic immunotherapy for respiratory syncytial virus (RSV): Secondary | ICD-10-CM

## 2014-03-15 DIAGNOSIS — Z0000 Encounter for general adult medical examination without abnormal findings: Secondary | ICD-10-CM

## 2014-03-15 DIAGNOSIS — Z23 Encounter for immunization: Secondary | ICD-10-CM

## 2014-03-15 DIAGNOSIS — R0789 Other chest pain: Secondary | ICD-10-CM

## 2014-03-15 MED ORDER — NEOMYCIN-POLYMYXIN-HC 3.5-10000-1 OT SOLN: 3.0000 [drp] | Freq: Four times a day (QID) | OTIC | Status: DC

## 2014-03-15 MED ORDER — ATENOLOL-CHLORTHALIDONE 50-25 MG PO TABS: 1.0000 | ORAL_TABLET | Freq: Every day | ORAL | Status: DC

## 2014-03-15 MED ORDER — POTASSIUM CHLORIDE ER 10 MEQ PO TBCR: 10.0000 meq | EXTENDED_RELEASE_TABLET | Freq: Every day | ORAL | Status: DC

## 2014-03-15 MED ORDER — ATORVASTATIN CALCIUM 20 MG PO TABS: 20.0000 mg | ORAL_TABLET | Freq: Every day | ORAL | Status: DC

## 2014-03-15 NOTE — Progress Notes (Signed)
Subjective:    Patient ID: Savannah Morrow, female    DOB: 02/08/1956, 58 y.o.   MRN: [id number]  HPI  This is a routine wellness  examination for this patient . I reviewed all health maintenance protocols including mammography, colonoscopy, bone density Needed referrals were placed. Age and diagnosis  appropriate screening labs were ordered. Her immunization history was reviewed and appropriate vaccinations were ordered. Her current medications and allergies were reviewed and needed refills of her chronic medications were ordered. The plan for yearly health maintenance was discussed all orders and referrals were made as appropriate.  Has complaints of chest tightness that occurs approximately twice a week associated with activity and inactivity. He reports a relatively sedentary lifestyle with work and leisure. The tightness last 30 seconds to 1 minute occurring a few times per week. Denies any shortness of breath, nausea, vomiting, diaphoresis. Has a family history of cardiovascular disease. Review of Systems  Constitutional: Negative.   HENT: Positive for ear discharge and ear pain.   Eyes: Negative.   Respiratory: Positive for chest tightness. Negative for apnea, cough and wheezing.   Cardiovascular: Negative.  Negative for chest pain and palpitations.  Gastrointestinal: Negative.   Endocrine: Negative.   Genitourinary: Negative.   Musculoskeletal: Negative.   Skin: Negative.   Allergic/Immunologic: Negative.   Neurological: Negative.   Hematological: Negative.   Psychiatric/Behavioral: Negative.    Past Medical History  Diagnosis Date  . Allergy   . Hyperlipidemia   . Hypertension   . Crohn's disease     History   Social History  . Marital Status: Married    Spouse Name: N/A    Number of Children: N/A  . Years of Education: N/A   Occupational History  . Not on file.   Social History Main Topics  . Smoking status: Never Smoker   . Smokeless tobacco: Not on file  .  Alcohol Use: No  . Drug Use: No  . Sexual Activity: Yes   Other Topics Concern  . Not on file   Social History Narrative  . No narrative on file    Past Surgical History  Procedure Laterality Date  . Appendectomy    . Abdominal hysterectomy    . Oophorectomy      Family History  Problem Relation Age of Onset  . Coronary artery disease    . Uterine cancer Mother   . Alzheimer's disease Mother   . Heart disease Father   . Hypertension Father   . Cancer Father     b cell lymphoma    Allergies  Allergen Reactions  . Meperidine Hcl     REACTION: unspecified  . Sulfamethoxazole-Trimethoprim     REACTION: unspecified    Current Outpatient Prescriptions on File Prior to Visit  Medication Sig Dispense Refill  . loratadine (CLARITIN) 10 MG tablet Take 10 mg by mouth daily as needed.      . multivitamin (THERAGRAN) per tablet Take 1 tablet by mouth daily.         No current facility-administered medications on file prior to visit.    BP 128/80  Pulse 77  Ht 5' 2.5" (1.588 m)  Wt 250 lb (113.399 kg)  BMI 44.97 kg/m2  SpO2 98%chart    Objective:   Physical Exam  Constitutional: She is oriented to person, place, and time. She appears well-developed and well-nourished.  HENT:  Head: Normocephalic and atraumatic.  Left Ear: External ear normal.  Nose: Nose normal.  Mouth/Throat: Oropharynx is  clear and moist.  Right ear: purulent discharge and tragal tenderness noted.   Eyes: Conjunctivae and EOM are normal. Pupils are equal, round, and reactive to light.  Neck: Normal range of motion. Neck supple. No thyromegaly present.  Cardiovascular: Normal rate, regular rhythm and normal heart sounds.   Pulmonary/Chest: Effort normal and breath sounds normal.  Abdominal: Soft. Bowel sounds are normal.  Musculoskeletal: Normal range of motion. She exhibits no edema and no tenderness.  Neurological: She is alert and oriented to person, place, and time. She has normal reflexes.  She displays normal reflexes. No cranial nerve deficit. Coordination normal.  Skin: Skin is warm and dry.  Psychiatric: She has a normal mood and affect.          Assessment & Plan:  Savannah Morrow was seen today for annual exam.  Diagnoses and associated orders for this visit:  Routine general medical examination at a health care facility - EKG 12-Lead  Need for prophylactic vaccination and inoculation against other viral diseases(V04.89) - Varicella-zoster vaccine subcutaneous  Chest tightness - Ambulatory referral to Cardiology  Other Orders - potassium chloride (K-DUR) 10 MEQ tablet; Take 1 tablet (10 mEq total) by mouth daily. - atorvastatin (LIPITOR) 20 MG tablet; Take 1 tablet (20 mg total) by mouth daily. - atenolol-chlorthalidone (TENORETIC) 50-25 MG per tablet; Take 1 tablet by mouth daily. - neomycin-polymyxin-hydrocortisone (CORTISPORIN) otic solution; Place 3 drops into the right ear 4 (four) times daily.   Call the office with any questions or concerns. Recheck in 6 months and sooner as needed.

## 2014-03-15 NOTE — Patient Instructions (Signed)
Breast Self-Awareness Breast self-awareness allows you to notice a breast problem early while it is still small. Do a breast self-exam:  Every month, 5 7 days after your period (menstrual period).  At the same time each month if you do not have periods anymore. Look for any:  Difference between your breasts (size, shape, or position).  Change in breast shape or size.  Fluid or blood coming from your nipples.  Changes in your nipples (dimpling, nipple movement).   Change in skin color or texture (redness, scaly areas). Feel for:  Lumps.  Bumps.  Dips.  Any other changes. HOW TO DO A BREAST SELF-EXAM Look at your breasts and nipples. 1. Take off all your clothes above your waist. 2. Stand in front of a mirror in a room with good lighting. 3. Put your hands on your hips and push your hands downward. Feel your breasts.  1. Lie flat on your back or stand in the shower or tub. If you are in the shower or tub, have wet, soapy hands. 2. Place your right arm above your head. 3. Place your left hand in the right underarm area. 4. Make small circles using the pads (not the fingertips) of your 3 middle fingers. Press lightly and then with medium and firm pressure. 5. Move your fingers a little lower and make the small circles at the 3 pressures (light, medium, and firm). 6. Continue moving your fingers lower and making circles until you reach the bottom of your breast. 7. Move your fingers one finger-width towards the center of the body. 8. Continue making the circles, this time moving upward until you reach the bottom of your neck. 9. Move your fingers one finger-width towards the center of your body. 10. Make circles downward when starting at the bottom of the neck. Make circles upward when starting at the bottom of the breast. Stop when you reach the middle of the chest. 11.  Repeat these steps on the other breast. Write down what looks and feels normal for each breast. Also write  down any changes you notice. GET HELP RIGHT AWAY IF:  You see any changes in your breasts or nipples.  You see skin changes.  You have unusual discharge from your nipples.  You feel a new lump.  You feel unusually thick areas. Document Released: 03/24/2008 Document Revised: 09/22/2012 Document Reviewed: 01/21/2012 [facility] Patient Information 2014 [place].

## 2014-03-15 NOTE — Progress Notes (Signed)
Pre visit review using our clinic review tool, if applicable. No additional management support is needed unless otherwise documented below in the visit note. 

## 2014-03-23 ENCOUNTER — Encounter: Payer: Self-pay | Admitting: Cardiovascular Disease

## 2014-03-23 ENCOUNTER — Encounter (HOSPITAL_COMMUNITY): Payer: Self-pay | Admitting: *Deleted

## 2014-03-23 ENCOUNTER — Other Ambulatory Visit (HOSPITAL_COMMUNITY): Payer: Self-pay | Admitting: Cardiovascular Disease

## 2014-03-23 ENCOUNTER — Ambulatory Visit (INDEPENDENT_AMBULATORY_CARE_PROVIDER_SITE_OTHER): Payer: 59 | Admitting: Cardiovascular Disease

## 2014-03-23 VITALS — BP 126/64 | HR 64 | Ht 64.0 in | Wt 246.7 lb

## 2014-03-23 DIAGNOSIS — I10 Essential (primary) hypertension: Secondary | ICD-10-CM

## 2014-03-23 DIAGNOSIS — R011 Cardiac murmur, unspecified: Secondary | ICD-10-CM

## 2014-03-23 DIAGNOSIS — E785 Hyperlipidemia, unspecified: Secondary | ICD-10-CM

## 2014-03-23 DIAGNOSIS — R079 Chest pain, unspecified: Secondary | ICD-10-CM

## 2014-03-23 DIAGNOSIS — I25810 Atherosclerosis of coronary artery bypass graft(s) without angina pectoris: Secondary | ICD-10-CM

## 2014-03-23 NOTE — Patient Instructions (Signed)
Your physician has requested that you have an echocardiogram. Echocardiography is a painless test that uses sound waves to create images of your heart. It provides your doctor with information about the size and shape of your heart and how well your heart's chambers and valves are working. This procedure takes approximately one hour. There are no restrictions for this procedure.  Your physician has requested that you have an exercise tolerance test. For further information please visit [email or web address]. Please also follow instruction sheet, as given.  Your physician recommends that you schedule a follow-up appointment after all your test are complete.

## 2014-03-30 ENCOUNTER — Encounter: Payer: Self-pay | Admitting: Cardiovascular Disease

## 2014-03-30 DIAGNOSIS — R079 Chest pain, unspecified: Secondary | ICD-10-CM | POA: Insufficient documentation

## 2014-03-30 NOTE — Progress Notes (Signed)
Patient ID: CLINTON WAHLBERG, female   DOB: 1955-12-05, 58 y.o.   MRN: [id number]     PATIENT PROFILE: TWINKLE SOCKWELL is a 58 y.o. female who presents to the office today for evaluation of intermittent chest pain.  She has been followed by Dr. Benay Pillow for her primary care.   HPI:  VALINA MAES is a 58 y.o. female who has a history of hypertension, obesity ,as well as hyperlipidemia.  She has been on Lipitor 20 mg daily.  With reference to blood pressure she has been on Tenoretic 50/25 daily.  Recently, she has noticed episodes of chest tightness, which he describes as a squeezing sensation.  Oftentimes seems to be on the left side of her chest.  It seems to be worse during periods of stress.  She admits to being fairly sedentary.  Because of recent increasing symptomatology.  She now presents for cardiology evaluation. She denies any PND, orthopnea.  She denies any palpitations.  She denies presyncope or syncope.   Past Medical History  Diagnosis Date  . Allergy   . Hyperlipidemia   . Hypertension   . Crohn's disease     Past Surgical History  Procedure Laterality Date  . Appendectomy    . Abdominal hysterectomy    . Oophorectomy      Allergies  Allergen Reactions  . Meperidine Hcl     REACTION: unspecified  . Sulfamethoxazole-Trimethoprim     REACTION: unspecified    Current Outpatient Prescriptions  Medication Sig Dispense Refill  . aspirin 81 MG tablet Take 81 mg by mouth daily.      Marland Kitchen atenolol-chlorthalidone (TENORETIC) 50-25 MG per tablet Take 1 tablet by mouth daily.  90 tablet  3  . atorvastatin (LIPITOR) 20 MG tablet Take 1 tablet (20 mg total) by mouth daily.  90 tablet  3  . loratadine (CLARITIN) 10 MG tablet Take 10 mg by mouth daily as needed.      . multivitamin (THERAGRAN) per tablet Take 1 tablet by mouth daily.        Marland Kitchen neomycin-polymyxin-hydrocortisone (CORTISPORIN) otic solution Place 3 drops into the right ear 4 (four) times daily.  10 mL  0  . potassium  chloride (K-DUR) 10 MEQ tablet Take 1 tablet (10 mEq total) by mouth daily.  90 tablet  3   No current facility-administered medications for this visit.    Socially, she is married for 42 years.  She has 3 children and 7 grandchildren one great grandchild.  She works as a Health visitor at [employer].  She completed 4 years of college and has a Market researcher.  There is no history of tobacco or alcohol use.  She does do gardening, as well as occasionally walk up to 20 minutes at a time.  Family History  Problem Relation Age of Onset  . Coronary artery disease    . Uterine cancer Mother   . Alzheimer's disease Mother   . Heart disease Father   . Hypertension Father   . Cancer Father     b cell lymphoma    ROS General: Positive for morbid obesity; No fevers, chills, or night sweats HEENT: Positive for recent otitis externa; No changes in vision , sinus congestion, difficulty swallowing Pulmonary: Negative; No cough, wheezing, shortness of breath, hemoptysis Cardiovascular:  See HPI; occasional leg swelling GI: Positive for history of colon resection, appendectomy, . No nausea, vomiting, diarrhea, or abdominal pain GU: Positive for abdominal hysterectomy ; No dysuria, hematuria,  or difficulty voiding Musculoskeletal: Negative; no myalgias, joint pain, or weakness Hematologic/Oncologic: Negative; no easy bruising, bleeding Endocrine: Negative; no heat/cold intolerance; no diabetes Neuro: Negative; no changes in balance, headaches Skin: Negative; No rashes or skin lesions Psychiatric: Negative; No behavioral problems, depression Sleep: Negative; No daytime sleepiness, hypersomnolence, bruxism, restless legs, hypnogagnic hallucinations Other comprehensive 14 point system review is negative  Physical Exam BP 126/64  Pulse 64  Ht 5' 4"  (1.626 m)  Wt 246 lb 11.2 oz (111.902 kg)  BMI 42.32 kg/m2 General: Alert, oriented, no distress.  Skin: normal turgor, no rashes, warm and dry HEENT:  Normocephalic, atraumatic. Pupils equal round and reactive to light; sclera anicteric; extraocular muscles intact; Fundi no AV nicking, hemorrhages, or exudates.  Discs flat Nose without nasal septal hypertrophy Mouth/Parynx benign; Mallinpatti scale 3 Neck: No JVD, no carotid bruits; normal carotid upstroke Lungs: clear to ausculatation and percussion; no wheezing or rales Chest wall: without tenderness to palpitation Heart: PMI not displaced, RRR, s1 s2 normal, 1/6 systolic murmur, no diastolic murmur, no rubs, gallops, thrills, or heaves Abdomen: soft, nontender; no hepatosplenomehaly, BS+; abdominal aorta nontender and not dilated by palpation. Back: no CVA tenderness Pulses 2+ Musculoskeletal: full range of motion, normal strength, no joint deformities Extremities: no clubbing cyanosis or edema, Homan's sign negative  Neurologic: grossly nonfocal; Cranial nerves grossly wnl Psychologic: Normal mood and affect   ECG (independently read by me): Sinus rhythm at 64 beats per minute.  No significant ST-T changes  LABS:  BMET    Component Value Date/Time   NA 142 03/06/2014 0811   K 3.8 03/06/2014 0811   CL 101 03/06/2014 0811   CO2 30 03/06/2014 0811   GLUCOSE 95 03/06/2014 0811   GLUCOSE 101* 10/22/2006 1217   BUN 18 03/06/2014 0811   CREATININE 0.7 03/06/2014 0811   CALCIUM 9.3 03/06/2014 0811   GFRNONAA 78.90 01/04/2010 0813   GFRAA 84 10/26/2008 0949     Hepatic Function Panel     Component Value Date/Time   PROT 7.4 03/06/2014 0811   ALBUMIN 4.0 03/06/2014 0811   AST 23 03/06/2014 0811   ALT 22 03/06/2014 0811   ALKPHOS 66 03/06/2014 0811   BILITOT 1.1 03/06/2014 0811   BILIDIR 0.1 03/06/2014 0811     CBC    Component Value Date/Time   WBC 7.5 03/06/2014 0811   RBC 4.66 03/06/2014 0811   HGB 13.9 03/06/2014 0811   HCT 41.4 03/06/2014 0811   PLT 257.0 03/06/2014 0811   MCV 88.9 03/06/2014 0811   MCHC 33.6 03/06/2014 0811   RDW 14.1 03/06/2014 0811   LYMPHSABS 1.8 03/06/2014 0811     MONOABS 0.7 03/06/2014 0811   EOSABS 0.3 03/06/2014 0811   BASOSABS 0.0 03/06/2014 0811     BNP No results found for this basename: probnp    Lipid Panel     Component Value Date/Time   CHOL 128 03/06/2014 0811   TRIG 113.0 03/06/2014 0811   HDL 40.40 03/06/2014 0811   CHOLHDL 3 03/06/2014 0811   VLDL 22.6 03/06/2014 0811   LDLCALC 65 03/06/2014 0811      RADIOLOGY: No results found.   ASSESSMENT AND PLAN: Ms. Kiyona Mcnall is a 58 year old female, who has a history of hypertension, which has been treated with combination atenolol and chlorthalidone.  Her blood pressure today is well controlled initially at 126/64 and repeat 136/72.  She is on Lipitor for hyperlipidemia.  I did review recent blood work, which had been done  by Dr. Arnoldo Morale on 03/06/2014.  Renal function was normal.  Fasting glucose was normal at 95.  Hemoglobin and hematocrit were stable.  LFTs were normal.  TSH was normal.  Total cholesterol is excellent at 128 with an LDL of 65, triglycerides 113, HDL 40, on current therapy.  She presents today complaining of left-sided chest discomfort, which he describes as a squeezing sensation.  This does at times appear to be more stress mediated.  She also has noted shortness of breath, particularly with stairclimbing.  I am recommending that she undergo an echo Doppler study to evaluate her systolic and diastolic function, particularly with her hypertensive history.  I am also scheduling her for a routine treadmill test for further evaluation of her chest pain.  I will see her back in the office in 3-4 weeks for followup evaluation.   Troy Sine, MD, [facility] 03/30/2014 6:40 PM

## 2014-04-06 ENCOUNTER — Telehealth (HOSPITAL_COMMUNITY): Payer: Self-pay

## 2014-04-07 ENCOUNTER — Ambulatory Visit (HOSPITAL_BASED_OUTPATIENT_CLINIC_OR_DEPARTMENT_OTHER)
Admission: RE | Admit: 2014-04-07 | Discharge: 2014-04-07 | Disposition: A | Discharge status: Home / Self Care | Payer: 59 | Source: Ambulatory Visit | Attending: Cardiovascular Disease | Admitting: Cardiovascular Disease

## 2014-04-07 ENCOUNTER — Ambulatory Visit (HOSPITAL_COMMUNITY)
Admission: RE | Admit: 2014-04-07 | Discharge: 2014-04-07 | Disposition: A | Discharge status: Home / Self Care | Payer: 59 | Source: Ambulatory Visit | Attending: Cardiovascular Disease | Admitting: Cardiovascular Disease

## 2014-04-07 DIAGNOSIS — R079 Chest pain, unspecified: Secondary | ICD-10-CM

## 2014-04-07 DIAGNOSIS — I10 Essential (primary) hypertension: Secondary | ICD-10-CM

## 2014-04-07 DIAGNOSIS — E669 Obesity, unspecified: Secondary | ICD-10-CM

## 2014-04-07 DIAGNOSIS — R072 Precordial pain: Secondary | ICD-10-CM

## 2014-04-07 NOTE — Progress Notes (Signed)
2D Echocardiogram Complete.  04/07/2014   Savannah Morrow, [place]

## 2014-04-07 NOTE — Procedures (Signed)
Exercise Treadmill Test  Pre-Exercise Testing Evaluation Rhythm: normal sinus  Rate: 80   PR:  .14 QRS:  .08  QT:  .36       ST Segments:  no significant ST changes at rest     Test  Exercise Tolerance Test Ordering MD: Troy Sine    Unique Test No: 1   Treadmill:  1  Indication for ETT: chest pain - rule out ischemia  Contraindication to ETT: No   Stress Modality: exercise - treadmill  Cardiac Imaging Performed: non   Protocol: standard Bruce - maximal  Max BP:  160/95   Max MPHR (bpm): 160 85% MPR (bpm):  136  MPHR obtained (bpm):  164 % MPHR obtained:  102  Reached 85% MPHR (min:sec):  4:15 Total Exercise Time (min-sec):  6   Workload in METS:  7.0  Borg Scale: 14  Reason ETT Terminated:  fatigue    ST Segment Analysis At Rest: normal ST segments - no evidence of significant ST depression With Exercise: no evidence of significant ST depression; occasional PVC's in recovery  Other Information Arrhythmia:  Yes Angina during ETT:  absent (0) Quality of ETT:  diagnostic  ETT Interpretation:  normal - no evidence of ischemia by ST analysis  Comments: Normal GXT with patient exercising to a 7 met workload and 101% APMHR; Occasional PVC in early recovery. [facility] Treadmill Score 6

## 2014-04-20 NOTE — Telephone Encounter (Signed)
Encounter complete. 

## 2014-07-19 ENCOUNTER — Ambulatory Visit (INDEPENDENT_AMBULATORY_CARE_PROVIDER_SITE_OTHER): Payer: 59 | Admitting: Cardiovascular Disease

## 2014-07-19 ENCOUNTER — Encounter: Payer: Self-pay | Admitting: Cardiovascular Disease

## 2014-07-19 VITALS — BP 132/80 | HR 75 | Ht 64.0 in | Wt 252.2 lb

## 2014-07-19 DIAGNOSIS — R600 Localized edema: Secondary | ICD-10-CM

## 2014-07-19 DIAGNOSIS — I10 Essential (primary) hypertension: Secondary | ICD-10-CM

## 2014-07-19 DIAGNOSIS — E669 Obesity, unspecified: Secondary | ICD-10-CM

## 2014-07-19 DIAGNOSIS — R609 Edema, unspecified: Secondary | ICD-10-CM

## 2014-07-19 DIAGNOSIS — E785 Hyperlipidemia, unspecified: Secondary | ICD-10-CM

## 2014-07-19 DIAGNOSIS — R0789 Other chest pain: Secondary | ICD-10-CM

## 2014-07-19 NOTE — Progress Notes (Signed)
Patient ID: Savannah Morrow, female   DOB: January 18, 1956, 58 y.o.   MRN: [id number]     HPI: Savannah Morrow is a 58 y.o. female who presents for followup cardiology evaluation following a recent echo and stress test.  She is a patient who has been followed in the past by Dr. Benay Pillow will be establishing with a new primary physician.   Savannah Morrow  has a history of hypertension, obesity ,as well as hyperlipidemia.  She has been on Lipitor 20 mg daily and Tenoretic 50/25 daily.  Recently, she had noticed episodes of chest tightness, which he describes as a squeezing sensation.  Oftentimes seems to be on the left side of her chest.  It seems to be worse during periods of stress.  She admits to being fairly sedentary.  Because of recent increasing symptomatology she presents for cardiology evaluation with me in June 2015.   She underwent a 2-D echo Doppler study, which was normal and revealed an ejection fraction of 60-65%.  She did not have evidence for LVH or wall motion abnormalities.  She had normal valvular architecture.  She underwent a routine treadmill test.  She was able to achieve a peak heart rate of 164 beats per minute, represent one of 2% of predicted maximum.  Workload was 7 METs.  There is no evidence for chest pain or ECG changes.  There was an occasional PVC in recovery.  She denies any PND, orthopnea.  She denies any palpitations.  She denies presyncope or syncope.  She does admit to leg swelling bilaterally.   Past Medical History  Diagnosis Date  . Allergy   . Hyperlipidemia   . Hypertension   . Crohn's disease     Past Surgical History  Procedure Laterality Date  . Appendectomy    . Abdominal hysterectomy    . Oophorectomy      Allergies  Allergen Reactions  . Meperidine Hcl     REACTION: unspecified  . Sulfamethoxazole-Trimethoprim     REACTION: unspecified    Current Outpatient Prescriptions  Medication Sig Dispense Refill  . aspirin 81 MG tablet Take 81 mg by  mouth daily.      Marland Kitchen atenolol-chlorthalidone (TENORETIC) 50-25 MG per tablet Take 1 tablet by mouth daily.  90 tablet  3  . atorvastatin (LIPITOR) 20 MG tablet Take 1 tablet (20 mg total) by mouth daily.  90 tablet  3  . loratadine (CLARITIN) 10 MG tablet Take 10 mg by mouth daily as needed.      . multivitamin (THERAGRAN) per tablet Take 1 tablet by mouth daily.        Marland Kitchen neomycin-polymyxin-hydrocortisone (CORTISPORIN) otic solution Place 3 drops into the right ear 4 (four) times daily.  10 mL  0  . potassium chloride (K-DUR) 10 MEQ tablet Take 1 tablet (10 mEq total) by mouth daily.  90 tablet  3   No current facility-administered medications for this visit.    Socially, she is married for 42 years.  She has 3 children and 7 grandchildren one great grandchild.  She works as a Health visitor at [employer].  She completed 4 years of college and has a Market researcher.  There is no history of tobacco or alcohol use.  She does do gardening, as well as occasionally walk up to 20 minutes at a time.  Family History  Problem Relation Age of Onset  . Coronary artery disease    . Uterine cancer Mother   . Alzheimer's  disease Mother   . Heart disease Father   . Hypertension Father   . Cancer Father     b cell lymphoma    ROS General: Positive for morbid obesity; No fevers, chills, or night sweats HEENT: Positive for recent otitis externa; No changes in vision , sinus congestion, difficulty swallowing Pulmonary: Negative; No cough, wheezing, shortness of breath, hemoptysis Cardiovascular:  See HPI; occasional leg swelling GI: Positive for history of colon resection, appendectomy, . No nausea, vomiting, diarrhea, or abdominal pain GU: Positive for abdominal hysterectomy ; No dysuria, hematuria, or difficulty voiding Musculoskeletal: Negative; no myalgias, joint pain, or weakness Hematologic/Oncologic: Negative; no easy bruising, bleeding Endocrine: Negative; no heat/cold intolerance; no diabetes Neuro:  Negative; no changes in balance, headaches Skin: Negative; No rashes or skin lesions Psychiatric: Negative; No behavioral problems, depression Sleep: Negative; No daytime sleepiness, hypersomnolence, bruxism, restless legs, hypnogagnic hallucinations Other comprehensive 14 point system review is negative  Physical Exam BP 132/80  Pulse 75  Ht _0  (1.626 m)  Wt 252 lb 3.2 oz (114.397 kg)  BMI 43.27 kg/m2 General: Alert, oriented, no distress.  Skin: normal turgor, no rashes, warm and dry HEENT: Normocephalic, atraumatic. Pupils equal round and reactive to light; sclera anicteric; extraocular muscles intact; Fundi no AV nicking, hemorrhages, or exudates.  Discs flat Nose without nasal septal hypertrophy Mouth/Parynx benign; Mallinpatti scale 3 Neck: No JVD, no carotid bruits; normal carotid upstroke Lungs: clear to ausculatation and percussion; no wheezing or rales Chest wall: without tenderness to palpitation Heart: PMI not displaced, RRR, s1 s2 normal, 1/6 systolic murmur, no diastolic murmur, no rubs, gallops, thrills, or heaves Abdomen: soft, nontender; no hepatosplenomehaly, BS+; abdominal aorta nontender and not dilated by palpation. Back: no CVA tenderness Pulses 2+ Musculoskeletal: full range of motion, normal strength, no joint deformities Extremities: no clubbing cyanosis or edema, Homan's sign negative  Neurologic: grossly nonfocal; Cranial nerves grossly wnl Psychologic: Normal mood and affect   Prior ECG (independently read by me): Sinus rhythm at 64 beats per minute.  No significant ST-T changes  LABS:  BMET    Component Value Date/Time   NA 142 03/06/2014 0811   K 3.8 03/06/2014 0811   CL 101 03/06/2014 0811   CO2 30 03/06/2014 0811   GLUCOSE 95 03/06/2014 0811   GLUCOSE 101* 10/22/2006 1217   BUN 18 03/06/2014 0811   CREATININE 0.7 03/06/2014 0811   CALCIUM 9.3 03/06/2014 0811   GFRNONAA 78.90 01/04/2010 0813   GFRAA 84 10/26/2008 0949     Hepatic Function Panel       Component Value Date/Time   PROT 7.4 03/06/2014 0811   ALBUMIN 4.0 03/06/2014 0811   AST 23 03/06/2014 0811   ALT 22 03/06/2014 0811   ALKPHOS 66 03/06/2014 0811   BILITOT 1.1 03/06/2014 0811   BILIDIR 0.1 03/06/2014 0811     CBC    Component Value Date/Time   WBC 7.5 03/06/2014 0811   RBC 4.66 03/06/2014 0811   HGB 13.9 03/06/2014 0811   HCT 41.4 03/06/2014 0811   PLT 257.0 03/06/2014 0811   MCV 88.9 03/06/2014 0811   MCHC 33.6 03/06/2014 0811   RDW 14.1 03/06/2014 0811   LYMPHSABS 1.8 03/06/2014 0811   MONOABS 0.7 03/06/2014 0811   EOSABS 0.3 03/06/2014 0811   BASOSABS 0.0 03/06/2014 0811     BNP No results found for this basename: probnp    Lipid Panel     Component Value Date/Time   CHOL 128 03/06/2014 0811  TRIG 113.0 03/06/2014 0811   HDL 40.40 03/06/2014 0811   CHOLHDL 3 03/06/2014 0811   VLDL 22.6 03/06/2014 0811   LDLCALC 65 03/06/2014 0811      RADIOLOGY: No results found.   ASSESSMENT AND PLAN: Savannah Morrow is a 58 year old female, who has a history of hypertension, which has been treated with combination atenolol and chlorthalidone.  Her blood pressure today is well controlled and on repeat by me was 130/80.  She is on Lipitor for hyperlipidemia.  I did review recent blood work, which had been done by Dr. Arnoldo Morale on 03/06/2014.  Renal function was normal.  Fasting glucose was normal at 95.  Hemoglobin and hematocrit were stable.  LFTs were normal.  TSH was normal.  Total cholesterol is excellent at 128 with an LDL of 65, triglycerides 113, HDL 40, on current therapy.  I reviewed her recent echo Doppler study, which showed entirely normal.  Cardiac function and valvular architecture.  Ejection fraction was 60-65%.  Her exercise treadmill test is negative for ischemia and she was able to achieve 102% of predicted maximum and a 7 met workload.  She does have 1+ edema above her sock line bilaterally.  Despite taking her diuretic regimen, and Tenoretic.  I have written her  a prescription for support stockings 20-30 mm to see if this improves her leg swelling.  She turned to her primary physician for followup evaluation and if she continues to have peripheral edema.  She may require a change of her diuretic regimen.  I will be available to see her on an as-needed basis in the future if cardiac problems arise.   Troy Sine, MD, [facility] 07/19/2014 9:26 AM

## 2014-07-19 NOTE — Patient Instructions (Signed)
Your physician recommends that you schedule a follow-up appointment as needed with Dr. Claiborne Billings . He recommends that you return to the care of your PCP.  Compression Stockings Compression stockings are elastic stockings that "compress" your legs. This helps to increase blood flow, decrease swelling, and reduces the chance of getting blood clots in your lower legs. Compression stockings are used:  After surgery.  If you have a history of poor circulation.  If you are prone to blood clots.  If you have varicose veins.  If you sit or are bedridden for long periods of time. WEARING COMPRESSION STOCKINGS  Your compression stockings should be worn as instructed by your caregiver.  Wearing the correct stocking size is important. Your caregiver can help measure and fit you to the correct size.  When wearing your stockings, do not allow the stockings to bunch up. This is especially important around your toes or behind your knees. Keep the stockings as smooth as possible.  Do not roll the stockings downward and leave them rolled down. This can form a restrictive band around your legs and can decrease blood flow.  The stockings should be removed once a day for 1 hour or as instructed by your caregiver. When the stockings are taken off, inspect your legs and feet. Look for:  Open sores.  Red spots.  Puffy areas (swelling).  Anything that does not seem normal. IMPORTANT INFORMATION ABOUT COMPRESSION STOCKINGS  The compression stockings should be clean, dry, and in good condition before you put them on.  Do not put lotion on your legs or feet. This makes it harder to put the stockings on.  Change your stockings immediately if they become wet or soiled.  Do not wear stockings that are ripped or torn.  You may hand-wash or put your stockings in the washing machine. Use cold or warm water with mild detergent. Do not bleach your stockings. They may be air-dried or dried in the dryer on low  heat.  If you have pain or have a feeling of "pins and needles" in your feet or legs, you may be wearing stockings that are too tight. Call your caregiver right away. SEEK IMMEDIATE MEDICAL CARE IF:   You have numbness or tingling in your lower legs that does not get better quickly after the stockings are removed.  Your toes or feet become cold and blue.  You develop open sores or have red spots on your legs that do not go away. MAKE SURE YOU:   Understand these instructions.  Will watch your condition.  Will get help right away if you are not doing well or get worse. Document Released: 08/03/2009 Document Revised: 12/29/2011 Document Reviewed: 08/03/2009 [facility] Patient Information 2015 [place], [place]. This information is not intended to replace advice given to you by your health care provider. Make sure you discuss any questions you have with your health care provider.

## 2014-07-21 ENCOUNTER — Encounter: Payer: Self-pay | Admitting: Internal Medicine

## 2014-07-27 ENCOUNTER — Encounter: Payer: Self-pay | Admitting: Family

## 2014-12-25 ENCOUNTER — Telehealth: Payer: Self-pay | Admitting: Internal Medicine

## 2014-12-25 NOTE — Telephone Encounter (Signed)
Pt due cpx around 03-16-15. Pt would like [facility] to do her cpx. Can I sch once template is open for may

## 2015-01-26 NOTE — Telephone Encounter (Signed)
NP said ok to sch cpx for her old pt with her

## 2015-03-13 NOTE — Telephone Encounter (Signed)
Pt has been sch

## 2015-03-13 NOTE — Telephone Encounter (Signed)
lmom for pt to call back

## 2015-03-23 ENCOUNTER — Other Ambulatory Visit (INDEPENDENT_AMBULATORY_CARE_PROVIDER_SITE_OTHER): Payer: 59

## 2015-03-23 ENCOUNTER — Other Ambulatory Visit: Payer: Self-pay

## 2015-03-23 DIAGNOSIS — Z0000 Encounter for general adult medical examination without abnormal findings: Secondary | ICD-10-CM

## 2015-03-23 DIAGNOSIS — Z1231 Encounter for screening mammogram for malignant neoplasm of breast: Secondary | ICD-10-CM

## 2015-03-23 LAB — CBC WITH DIFFERENTIAL/PLATELET
Basophils Absolute: 0 10*3/uL (ref 0.0–0.1)
Basophils Relative: 0.5 % (ref 0.0–3.0)
EOS PCT: 4 % (ref 0.0–5.0)
Eosinophils Absolute: 0.3 10*3/uL (ref 0.0–0.7)
HCT: 42.9 % (ref 36.0–46.0)
Hemoglobin: 14.2 g/dL (ref 12.0–15.0)
Lymphocytes Relative: 25.5 % (ref 12.0–46.0)
Lymphs Abs: 2 10*3/uL (ref 0.7–4.0)
MCHC: 33.1 g/dL (ref 30.0–36.0)
MCV: 88.4 fl (ref 78.0–100.0)
MONO ABS: 0.6 10*3/uL (ref 0.1–1.0)
MONOS PCT: 8 % (ref 3.0–12.0)
Neutro Abs: 5 10*3/uL (ref 1.4–7.7)
Neutrophils Relative %: 62 % (ref 43.0–77.0)
Platelets: 302 10*3/uL (ref 150.0–400.0)
RBC: 4.86 Mil/uL (ref 3.87–5.11)
RDW: 14.3 % (ref 11.5–15.5)
WBC: 8 10*3/uL (ref 4.0–10.5)

## 2015-03-23 LAB — HEPATIC FUNCTION PANEL
ALT: 20 U/L (ref 0–35)
AST: 17 U/L (ref 0–37)
Albumin: 4.1 g/dL (ref 3.5–5.2)
Alkaline Phosphatase: 85 U/L (ref 39–117)
Bilirubin, Direct: 0.1 mg/dL (ref 0.0–0.3)
Total Bilirubin: 0.7 mg/dL (ref 0.2–1.2)
Total Protein: 7.6 g/dL (ref 6.0–8.3)

## 2015-03-23 LAB — POCT URINALYSIS DIPSTICK
BILIRUBIN UA: NEGATIVE
GLUCOSE UA: NEGATIVE
Ketones, UA: NEGATIVE
Leukocytes, UA: NEGATIVE
Nitrite, UA: NEGATIVE
Protein, UA: NEGATIVE
RBC UA: NEGATIVE
SPEC GRAV UA: 1.02
Urobilinogen, UA: 0.2
pH, UA: 5.5

## 2015-03-23 LAB — BASIC METABOLIC PANEL
BUN: 15 mg/dL (ref 6–23)
CALCIUM: 9.7 mg/dL (ref 8.4–10.5)
CO2: 32 mEq/L (ref 19–32)
Chloride: 100 mEq/L (ref 96–112)
Creatinine, Ser: 0.8 mg/dL (ref 0.40–1.20)
GFR: 77.48 mL/min (ref >60.00)
Glucose, Bld: 108 mg/dL — ABNORMAL HIGH (ref 70–99)
Potassium: 4 mEq/L (ref 3.5–5.1)
Sodium: 140 mEq/L (ref 135–145)

## 2015-03-23 LAB — LIPID PANEL
Cholesterol: 142 mg/dL (ref 0–200)
HDL: 36.5 mg/dL — ABNORMAL LOW (ref >39.00)
LDL CALC: 76 mg/dL (ref 0–99)
NonHDL: 105.5
TRIGLYCERIDES: 149 mg/dL (ref 0.0–149.0)
Total CHOL/HDL Ratio: 4
VLDL: 29.8 mg/dL (ref 0.0–40.0)

## 2015-03-23 LAB — TSH: TSH: 2.82 u[IU]/mL (ref 0.35–4.50)

## 2015-03-29 ENCOUNTER — Ambulatory Visit
Admission: RE | Admit: 2015-03-29 | Discharge: 2015-03-29 | Disposition: A | Discharge status: Home / Self Care | Payer: 59 | Source: Ambulatory Visit

## 2015-03-29 DIAGNOSIS — Z1231 Encounter for screening mammogram for malignant neoplasm of breast: Secondary | ICD-10-CM

## 2015-03-30 ENCOUNTER — Ambulatory Visit (INDEPENDENT_AMBULATORY_CARE_PROVIDER_SITE_OTHER): Payer: 59 | Admitting: Family

## 2015-03-30 ENCOUNTER — Other Ambulatory Visit (HOSPITAL_COMMUNITY)
Admission: RE | Admit: 2015-03-30 | Discharge: 2015-03-30 | Disposition: A | Discharge status: Home / Self Care | Payer: 59 | Source: Ambulatory Visit | Attending: Family | Admitting: Family

## 2015-03-30 ENCOUNTER — Other Ambulatory Visit: Payer: Self-pay | Admitting: *Deleted

## 2015-03-30 ENCOUNTER — Encounter: Payer: Self-pay | Admitting: Family

## 2015-03-30 VITALS — BP 140/90 | HR 83 | Temp 99.0°F | Resp 18 | Ht 64.0 in | Wt 256.8 lb

## 2015-03-30 DIAGNOSIS — I10 Essential (primary) hypertension: Secondary | ICD-10-CM

## 2015-03-30 DIAGNOSIS — Z0000 Encounter for general adult medical examination without abnormal findings: Secondary | ICD-10-CM | POA: Diagnosis not present

## 2015-03-30 DIAGNOSIS — Z01419 Encounter for gynecological examination (general) (routine) without abnormal findings: Secondary | ICD-10-CM | POA: Insufficient documentation

## 2015-03-30 DIAGNOSIS — H938X1 Other specified disorders of right ear: Secondary | ICD-10-CM

## 2015-03-30 DIAGNOSIS — H9201 Otalgia, right ear: Secondary | ICD-10-CM

## 2015-03-30 DIAGNOSIS — E785 Hyperlipidemia, unspecified: Secondary | ICD-10-CM

## 2015-03-30 MED ORDER — ATENOLOL-CHLORTHALIDONE 50-25 MG PO TABS: 1.0000 | ORAL_TABLET | Freq: Every day | ORAL | Status: DC

## 2015-03-30 MED ORDER — POTASSIUM CHLORIDE ER 10 MEQ PO TBCR: 10.0000 meq | EXTENDED_RELEASE_TABLET | Freq: Every day | ORAL | Status: DC

## 2015-03-30 MED ORDER — ATORVASTATIN CALCIUM 20 MG PO TABS: 20.0000 mg | ORAL_TABLET | Freq: Every day | ORAL | Status: DC

## 2015-03-30 MED ORDER — NEOMYCIN-POLYMYXIN-HC 3.5-10000-1 OT SOLN: 3.0000 [drp] | Freq: Four times a day (QID) | OTIC | Status: DC

## 2015-03-30 NOTE — Progress Notes (Signed)
Pre visit review using our clinic review tool, if applicable. No additional management support is needed unless otherwise documented below in the visit note. 

## 2015-03-30 NOTE — Progress Notes (Signed)
Subjective:    Patient ID: Savannah Morrow, female    DOB: 1956/03/21, 59 y.o.   MRN: [id number]  HPI Annual Physical Exam:  59 year old, Caucasian female in today seen for an annual physical exam.   Past medical history of edema, hypertension, allergic rhinitis, hyperlipidemia, and obesity. Patient reports overall state of health as good. Mammogram completed this month. Ear pain:  Complains of right ear pain and pressure. States this has been a chronic issue and previously addressed by ENT as an inner ear skin disorder and treated successfully with neomycin-polymyxin-hydrocortisone.  Visual Disturbances:  Complains of blurred, cloudy vision in right eye intermittently. Last eye exam in January and optometrist advised to see opthalmologic.  Elevated Glucose: Patients reports concern with elevated fasting glucose. States she would like to increase exercise and control diet in order to lose weight.    Review of Systems  Constitutional: Negative.   HENT: Positive for ear pain.        See HPI  Eyes: Positive for visual disturbance.  Respiratory: Negative.   Cardiovascular: Positive for leg swelling.       See HPI  Gastrointestinal: Negative.   Endocrine: Negative.   Genitourinary: Negative.   Musculoskeletal: Negative.   Skin: Negative.   Allergic/Immunologic: Negative.   Neurological: Negative.   Hematological: Negative.   Psychiatric/Behavioral: Negative.       .jj Objective:   Physical Exam  Constitutional: She is oriented to person, place, and time. She appears well-developed and well-nourished.  HENT:  Head: Normocephalic.  Right Ear: External ear normal.  Left Ear: External ear normal.  Nose: Nose normal.  Mouth/Throat: Oropharynx is clear and moist.  Eyes: Conjunctivae and EOM are normal. Pupils are equal, round, and reactive to light.  Neck: Normal range of motion. Neck supple. No tracheal deviation present.  Cardiovascular: Normal rate, regular rhythm and normal  heart sounds.   Pulmonary/Chest: Effort normal and breath sounds normal.  Abdominal: Soft. Bowel sounds are normal.  Genitourinary: Vagina normal.  Musculoskeletal: Normal range of motion. She exhibits edema. She exhibits no tenderness.  Lymphadenopathy:    She has no cervical adenopathy.  Neurological: She is alert and oriented to person, place, and time.  Skin: Skin is warm and dry.  Psychiatric: She has a normal mood and affect. Her behavior is normal. Judgment and thought content normal.  Nursing note and vitals reviewed.         Assessment & Plan:  .Savannah Morrow was seen today for annual exam.  Diagnoses and all orders for this visit:  Routine general medical examination at a health care facility Orders: -     PAP [[place]]  Encounter for routine gynecological examination Orders: -     PAP [[place]]  Morbid obesity  Essential hypertension  Hyperlipidemia  Ear pain, right  Ear pressure, right  Other orders -     atenolol-chlorthalidone (TENORETIC) 50-25 MG per tablet; Take 1 tablet by mouth daily. -     atorvastatin (LIPITOR) 20 MG tablet; Take 1 tablet (20 mg total) by mouth daily. -     potassium chloride (K-DUR) 10 MEQ tablet; Take 1 tablet (10 mEq total) by mouth daily. -     neomycin-polymyxin-hydrocortisone (CORTISPORIN) otic solution; Place 3 drops into the left ear 4 (four) times daily.  Right ear pain:  Cerumen present in ear canal. Absent of redness, TM partially visible and grey. Prior ENT treated pain with neomycin-polymyxin-hydrocortisone and attributed pain to inner ear skin inflammation.  Right Eye Visual Disturbances:  Advised patient to follow-up with an Opthalmologists.  Elevated Glucose: Recommended HbA1C. Patient refused and would like to improve diet and increase exercise activity to reduce weight.  Follow-up as needed.

## 2015-03-30 NOTE — Patient Instructions (Signed)
Exercise to Stay Healthy Exercise helps you become and stay healthy. EXERCISE IDEAS AND TIPS Choose exercises that:  You enjoy.  Fit into your day. You do not need to exercise really hard to be healthy. You can do exercises at a slow or medium level and stay healthy. You can:  Stretch before and after working out.  Try yoga, Pilates, or tai chi.  Lift weights.  Walk fast, swim, jog, run, climb stairs, bicycle, dance, or rollerskate.  Take aerobic classes. Exercises that burn about 150 calories:  Running 1  miles in 15 minutes.  Playing volleyball for 45 to 60 minutes.  Washing and waxing a car for 45 to 60 minutes.  Playing touch football for 45 minutes.  Walking 1  miles in 35 minutes.  Pushing a stroller 1  miles in 30 minutes.  Playing basketball for 30 minutes.  Raking leaves for 30 minutes.  Bicycling 5 miles in 30 minutes.  Walking 2 miles in 30 minutes.  Dancing for 30 minutes.  Shoveling snow for 15 minutes.  Swimming laps for 20 minutes.  Walking up stairs for 15 minutes.  Bicycling 4 miles in 15 minutes.  Gardening for 30 to 45 minutes.  Jumping rope for 15 minutes.  Washing windows or floors for 45 to 60 minutes. Document Released: 11/08/2010 Document Revised: 12/29/2011 Document Reviewed: 11/08/2010 [facility] Patient Information 2015 [place], [place]. This information is not intended to replace advice given to you by your health care provider. Make sure you discuss any questions you have with your health care provider. Fat and Cholesterol Control Diet Fat and cholesterol levels in your blood and organs are influenced by your diet. High levels of fat and cholesterol may lead to diseases of the heart, small and large blood vessels, gallbladder, liver, and pancreas. CONTROLLING FAT AND CHOLESTEROL WITH DIET Although exercise and lifestyle factors are important, your diet is key. That is because certain foods are known to raise cholesterol and  others to lower it. The goal is to balance foods for their effect on cholesterol and more importantly, to replace saturated and trans fat with other types of fat, such as monounsaturated fat, polyunsaturated fat, and omega-3 fatty acids. On average, a person should consume no more than 15 to 17 g of saturated fat daily. Saturated and trans fats are considered "bad" fats, and they will raise LDL cholesterol. Saturated fats are primarily found in animal products such as meats, butter, and cream. However, that does not mean you need to give up all your favorite foods. Today, there are good tasting, low-fat, low-cholesterol substitutes for most of the things you like to eat. Choose low-fat or nonfat alternatives. Choose round or loin cuts of red meat. These types of cuts are lowest in fat and cholesterol. Chicken (without the skin), fish, veal, and ground [place] breast are great choices. Eliminate fatty meats, such as hot dogs and salami. Even shellfish have little or no saturated fat. Have a 3 oz (85 g) portion when you eat lean meat, poultry, or fish. Trans fats are also called "partially hydrogenated oils." They are oils that have been scientifically manipulated so that they are solid at room temperature resulting in a longer shelf life and improved taste and texture of foods in which they are added. Trans fats are found in stick margarine, some tub margarines, cookies, crackers, and baked goods.  When baking and cooking, oils are a great substitute for butter. The monounsaturated oils are especially beneficial since it is believed they  lower LDL and raise HDL. The oils you should avoid entirely are saturated tropical oils, such as coconut and palm.  Remember to eat a lot from food groups that are naturally free of saturated and trans fat, including fish, fruit, vegetables, beans, grains (barley, rice, couscous, bulgur wheat), and pasta (without cream sauces).  IDENTIFYING FOODS THAT LOWER FAT AND CHOLESTEROL    Soluble fiber may lower your cholesterol. This type of fiber is found in fruits such as apples, vegetables such as broccoli, potatoes, and carrots, legumes such as beans, peas, and lentils, and grains such as barley. Foods fortified with plant sterols (phytosterol) may also lower cholesterol. You should eat at least 2 g per day of these foods for a cholesterol lowering effect.  Read package labels to identify low-saturated fats, trans fat free, and low-fat foods at the supermarket. Select cheeses that have only 2 to 3 g saturated fat per ounce. Use a heart-healthy tub margarine that is free of trans fats or partially hydrogenated oil. When buying baked goods (cookies, crackers), avoid partially hydrogenated oils. Breads and muffins should be made from whole grains (whole-wheat or whole oat flour, instead of "flour" or "enriched flour"). Buy non-creamy canned soups with reduced salt and no added fats.  FOOD PREPARATION TECHNIQUES  Never deep-fry. If you must fry, either stir-fry, which uses very little fat, or use non-stick cooking sprays. When possible, broil, bake, or roast meats, and steam vegetables. Instead of putting butter or margarine on vegetables, use lemon and herbs, applesauce, and cinnamon (for squash and sweet potatoes). Use nonfat yogurt, salsa, and low-fat dressings for salads.  LOW-SATURATED FAT / LOW-FAT FOOD SUBSTITUTES Meats / Saturated Fat (g)  Avoid: Steak, marbled (3 oz/85 g) / 11 g  Choose: Steak, lean (3 oz/85 g) / 4 g  Avoid: Hamburger (3 oz/85 g) / 7 g  Choose: Hamburger, lean (3 oz/85 g) / 5 g  Avoid: Ham (3 oz/85 g) / 6 g  Choose: Ham, lean cut (3 oz/85 g) / 2.4 g  Avoid: Chicken, with skin, dark meat (3 oz/85 g) / 4 g  Choose: Chicken, skin removed, dark meat (3 oz/85 g) / 2 g  Avoid: Chicken, with skin, light meat (3 oz/85 g) / 2.5 g  Choose: Chicken, skin removed, light meat (3 oz/85 g) / 1 g Dairy / Saturated Fat (g)  Avoid: Whole milk (1 cup) / 5  g  Choose: Low-fat milk, 2% (1 cup) / 3 g  Choose: Low-fat milk, 1% (1 cup) / 1.5 g  Choose: Skim milk (1 cup) / 0.3 g  Avoid: Hard cheese (1 oz/28 g) / 6 g  Choose: Skim milk cheese (1 oz/28 g) / 2 to 3 g  Avoid: Cottage cheese, 4% fat (1 cup) / 6.5 g  Choose: Low-fat cottage cheese, 1% fat (1 cup) / 1.5 g  Avoid: Ice cream (1 cup) / 9 g  Choose: Sherbet (1 cup) / 2.5 g  Choose: Nonfat frozen yogurt (1 cup) / 0.3 g  Choose: Frozen fruit bar / trace  Avoid: Whipped cream (1 tbs) / 3.5 g  Choose: Nondairy whipped topping (1 tbs) / 1 g Condiments / Saturated Fat (g)  Avoid: Mayonnaise (1 tbs) / 2 g  Choose: Low-fat mayonnaise (1 tbs) / 1 g  Avoid: Butter (1 tbs) / 7 g  Choose: Extra light margarine (1 tbs) / 1 g  Avoid: Coconut oil (1 tbs) / 11.8 g  Choose: Olive oil (1 tbs) / 1.8 g  Choose: Corn oil (1 tbs) / 1.7 g  Choose: Safflower oil (1 tbs) / 1.2 g  Choose: Sunflower oil (1 tbs) / 1.4 g  Choose: Soybean oil (1 tbs) / 2.4 g  Choose: Canola oil (1 tbs) / 1 g Document Released: 10/06/2005 Document Revised: 01/31/2013 Document Reviewed: 01/04/2014 [employer] Patient Information 2015 [place], [place]. This information is not intended to replace advice given to you by your health care provider. Make sure you discuss any questions you have with your health care provider.

## 2015-04-02 LAB — CYTOLOGY - PAP

## 2015-12-27 MED FILL — ATENOLOL/CHLORTHAL 50/25: 50-25 | 90 days supply | Qty: 90 | Fill #3

## 2015-12-27 MED FILL — POTASSIUM CL ER 10 MEQ TAB: 10 | 90 days supply | Qty: 90 | Fill #3

## 2015-12-27 MED FILL — ATORVASTATIN 20 MG TABLET: 20 | 90 days supply | Qty: 90 | Fill #3

## 2015-12-28 MED FILL — CLINDAMYCIN HCL 150 MG CAPS: 150 | 7 days supply | Qty: 21 | Fill #0

## 2015-12-28 MED FILL — IBUPROFEN 600 MG TABLET: 600 | 5 days supply | Qty: 14 | Fill #0

## 2016-02-28 MED FILL — PENICILLIN VK 500 MG TABLET: 500 | 5 days supply | Qty: 18 | Fill #0

## 2016-04-07 ENCOUNTER — Telehealth: Payer: Self-pay | Admitting: Family Medicine

## 2016-04-07 ENCOUNTER — Encounter: Payer: Self-pay | Admitting: Family Medicine

## 2016-04-07 ENCOUNTER — Telehealth: Payer: Self-pay | Admitting: *Deleted

## 2016-04-07 ENCOUNTER — Other Ambulatory Visit: Payer: Self-pay | Admitting: Family Medicine

## 2016-04-07 ENCOUNTER — Ambulatory Visit (INDEPENDENT_AMBULATORY_CARE_PROVIDER_SITE_OTHER): Payer: 59 | Admitting: Family Medicine

## 2016-04-07 VITALS — BP 150/88 | HR 70 | Temp 98.7°F | Ht 61.75 in | Wt 254.0 lb

## 2016-04-07 DIAGNOSIS — Z0000 Encounter for general adult medical examination without abnormal findings: Secondary | ICD-10-CM | POA: Diagnosis not present

## 2016-04-07 DIAGNOSIS — Z7289 Other problems related to lifestyle: Secondary | ICD-10-CM | POA: Diagnosis not present

## 2016-04-07 DIAGNOSIS — I10 Essential (primary) hypertension: Secondary | ICD-10-CM

## 2016-04-07 DIAGNOSIS — R739 Hyperglycemia, unspecified: Secondary | ICD-10-CM | POA: Insufficient documentation

## 2016-04-07 DIAGNOSIS — Z418 Encounter for other procedures for purposes other than remedying health state: Secondary | ICD-10-CM

## 2016-04-07 DIAGNOSIS — Z1159 Encounter for screening for other viral diseases: Secondary | ICD-10-CM | POA: Diagnosis not present

## 2016-04-07 DIAGNOSIS — K5090 Crohn's disease, unspecified, without complications: Secondary | ICD-10-CM | POA: Insufficient documentation

## 2016-04-07 DIAGNOSIS — Z299 Encounter for prophylactic measures, unspecified: Secondary | ICD-10-CM

## 2016-04-07 DIAGNOSIS — R6889 Other general symptoms and signs: Secondary | ICD-10-CM | POA: Diagnosis not present

## 2016-04-07 DIAGNOSIS — Z114 Encounter for screening for human immunodeficiency virus [HIV]: Secondary | ICD-10-CM

## 2016-04-07 DIAGNOSIS — Z23 Encounter for immunization: Secondary | ICD-10-CM

## 2016-04-07 DIAGNOSIS — Z0001 Encounter for general adult medical examination with abnormal findings: Secondary | ICD-10-CM

## 2016-04-07 LAB — COMPREHENSIVE METABOLIC PANEL
ALBUMIN: 4.1 g/dL (ref 3.5–5.2)
ALT: 25 U/L (ref 0–35)
AST: 20 U/L (ref 0–37)
Alkaline Phosphatase: 83 U/L (ref 39–117)
BUN: 17 mg/dL (ref 6–23)
CALCIUM: 9.8 mg/dL (ref 8.4–10.5)
CHLORIDE: 99 meq/L (ref 96–112)
CO2: 29 mEq/L (ref 19–32)
Creatinine, Ser: 0.8 mg/dL (ref 0.40–1.20)
GFR: 77.22 mL/min (ref >60.00)
Glucose, Bld: 111 mg/dL — ABNORMAL HIGH (ref 70–99)
POTASSIUM: 3.4 meq/L — AB (ref 3.5–5.1)
Sodium: 139 mEq/L (ref 135–145)
TOTAL PROTEIN: 7.7 g/dL (ref 6.0–8.3)
Total Bilirubin: 0.7 mg/dL (ref 0.2–1.2)

## 2016-04-07 LAB — CBC
HEMATOCRIT: 40.8 % (ref 36.0–46.0)
HEMOGLOBIN: 13.6 g/dL (ref 12.0–15.0)
MCHC: 33.3 g/dL (ref 30.0–36.0)
MCV: 88 fl (ref 78.0–100.0)
PLATELETS: 309 10*3/uL (ref 150.0–400.0)
RBC: 4.63 Mil/uL (ref 3.87–5.11)
RDW: 14.6 % (ref 11.5–15.5)
WBC: 7.8 10*3/uL (ref 4.0–10.5)

## 2016-04-07 LAB — HIV ANTIBODY (ROUTINE TESTING W REFLEX): HIV 1&2 Ab, 4th Generation: NONREACTIVE

## 2016-04-07 LAB — LIPID PANEL
CHOL/HDL RATIO: 4
Cholesterol: 132 mg/dL (ref 0–200)
HDL: 36.7 mg/dL — AB (ref >39.00)
LDL CALC: 72 mg/dL (ref 0–99)
NonHDL: 95.55
TRIGLYCERIDES: 117 mg/dL (ref 0.0–149.0)
VLDL: 23.4 mg/dL (ref 0.0–40.0)

## 2016-04-07 LAB — TSH: TSH: 2.26 u[IU]/mL (ref 0.35–4.50)

## 2016-04-07 MED ORDER — CHLORTHALIDONE 25 MG PO TABS: 25.0000 mg | ORAL_TABLET | Freq: Every day | ORAL | Status: DC

## 2016-04-07 MED ORDER — FLUTICASONE PROPIONATE 50 MCG/ACT NA SUSP: 1.0000 | Freq: Every day | NASAL | Status: DC

## 2016-04-07 MED ORDER — ATENOLOL-CHLORTHALIDONE 50-25 MG PO TABS: 1.0000 | ORAL_TABLET | Freq: Every day | ORAL | Status: DC

## 2016-04-07 MED ORDER — ATORVASTATIN CALCIUM 20 MG PO TABS: 20.0000 mg | ORAL_TABLET | Freq: Every day | ORAL | Status: DC

## 2016-04-07 MED ORDER — POTASSIUM CHLORIDE ER 10 MEQ PO TBCR: 10.0000 meq | EXTENDED_RELEASE_TABLET | Freq: Every day | ORAL | Status: DC

## 2016-04-07 MED FILL — CHLORTHALIDONE 25 MG TABLET: 25 | 90 days supply | Qty: 90 | Fill #0

## 2016-04-07 NOTE — Patient Instructions (Addendum)
Continue atenolol- chlorthalidone 50-50m. Add chlorthalidone 278mto this regimen. Keep checking at home goal<140/90. Would love to check back in person within 2-3 months. Weight loss can certainly help.   We will attempt irrigation- if unsuccessful we can refer to ENT for you  Tdap    DASH Eating Plan DASH stands for "Dietary Approaches to Stop Hypertension." The DASH eating plan is a healthy eating plan that has been shown to reduce high blood pressure (hypertension). Additional health benefits may include reducing the risk of type 2 diabetes mellitus, heart disease, and stroke. The DASH eating plan may also help with weight loss. WHAT DO I NEED TO KNOW ABOUT THE DASH EATING PLAN? For the DASH eating plan, you will follow these general guidelines:  Choose foods with a percent daily value for sodium of less than 5% (as listed on the food label).  Use salt-free seasonings or herbs instead of table salt or sea salt.  Check with your health care provider or pharmacist before using salt substitutes.  Eat lower-sodium products, often labeled as "lower sodium" or "no salt added."  Eat fresh foods.  Eat more vegetables, fruits, and low-fat dairy products.  Choose whole grains. Look for the word "whole" as the first word in the ingredient list.  Choose fish and skinless chicken or tu[place] often than red meat. Limit fish, poultry, and meat to 6 oz (170 g) each day.  Limit sweets, desserts, sugars, and sugary drinks.  Choose heart-healthy fats.  Limit cheese to 1 oz (28 g) per day.  Eat more home-cooked food and less restaurant, buffet, and fast food.  Limit fried foods.  Cook foods using methods other than frying.  Limit canned vegetables. If you do use them, rinse them well to decrease the sodium.  When eating at a restaurant, ask that your food be prepared with less salt, or no salt if possible. WHAT FOODS CAN I EAT? Seek help from a dietitian for individual calorie  needs. Grains Whole grain or whole wheat bread. Brown rice. Whole grain or whole wheat pasta. Quinoa, bulgur, and whole grain cereals. Low-sodium cereals. Corn or whole wheat flour tortillas. Whole grain cornbread. Whole grain crackers. Low-sodium crackers. Vegetables Fresh or frozen vegetables (raw, steamed, roasted, or grilled). Low-sodium or reduced-sodium tomato and vegetable juices. Low-sodium or reduced-sodium tomato sauce and paste. Low-sodium or reduced-sodium canned vegetables.  Fruits All fresh, canned (in natural juice), or frozen fruits. Meat and Other Protein Products Ground beef (85% or leaner), grass-fed beef, or beef trimmed of fat. Skinless chicken or tu[place] chicken or tu[place]ork trimmed of fat. All fish and seafood. Eggs. Dried beans, peas, or lentils. Unsalted nuts and seeds. Unsalted canned beans. Dairy Low-fat dairy products, such as skim or 1% milk, 2% or reduced-fat cheeses, low-fat ricotta or cottage cheese, or plain low-fat yogurt. Low-sodium or reduced-sodium cheeses. Fats and Oils Tub margarines without trans fats. Light or reduced-fat mayonnaise and salad dressings (reduced sodium). Avocado. Safflower, olive, or canola oils. Natural peanut or almond butter. Other Unsalted popcorn and pretzels. The items listed above may not be a complete list of recommended foods or beverages. Contact your dietitian for more options. WHAT FOODS ARE NOT RECOMMENDED? Grains White bread. White pasta. White rice. Refined cornbread. Bagels and croissants. Crackers that contain trans fat. Vegetables Creamed or fried vegetables. Vegetables in a cheese sauce. Regular canned vegetables. Regular canned tomato sauce and paste. Regular tomato and vegetable juices. Fruits Dried fruits. Canned fruit in light or heavy syrup.  Fruit juice. Meat and Other Protein Products Fatty cuts of meat. Ribs, chicken wings, bacon, sausage, bologna, salami, chitterlings, fatback, hot dogs, bratwurst,  and packaged luncheon meats. Salted nuts and seeds. Canned beans with salt. Dairy Whole or 2% milk, cream, half-and-half, and cream cheese. Whole-fat or sweetened yogurt. Full-fat cheeses or blue cheese. Nondairy creamers and whipped toppings. Processed cheese, cheese spreads, or cheese curds. Condiments Onion and garlic salt, seasoned salt, table salt, and sea salt. Canned and packaged gravies. Worcestershire sauce. Tartar sauce. Barbecue sauce. Teriyaki sauce. Soy sauce, including reduced sodium. Steak sauce. Fish sauce. Oyster sauce. Cocktail sauce. Horseradish. Ketchup and mustard. Meat flavorings and tenderizers. Bouillon cubes. Hot sauce. Tabasco sauce. Marinades. Taco seasonings. Relishes. Fats and Oils Butter, stick margarine, lard, shortening, ghee, and bacon fat. Coconut, palm kernel, or palm oils. Regular salad dressings. Other Pickles and olives. Salted popcorn and pretzels. The items listed above may not be a complete list of foods and beverages to avoid. Contact your dietitian for more information. WHERE CAN I FIND MORE INFORMATION? National Heart, Lung, and Blood Institute: travelstabloid.com   This information is not intended to replace advice given to you by your health care provider. Make sure you discuss any questions you have with your health care provider.   Document Released: 09/25/2011 Document Revised: 10/27/2014 Document Reviewed: 08/10/2013 Elsevier Interactive Patient Education [employer].

## 2016-04-07 NOTE — Telephone Encounter (Signed)
Pt need new Rx for atenolol, Lipitor, K-Dur and Flonase pt would 90 day supply.  Pharm:  [facility].  Pharmacy has not rec'd these and pt state she will be out today.

## 2016-04-07 NOTE — Addendum Note (Signed)
Addended by: Marin Olp on: 04/07/2016 10:14 AM   Modules accepted: SmartSet

## 2016-04-07 NOTE — Telephone Encounter (Signed)
Error

## 2016-04-07 NOTE — Assessment & Plan Note (Signed)
S: atenolol- chlorthalidone 50-58m. Home readings 140s and 150s.poor control in office and at home BP Readings from Last 3 Encounters:  04/07/16 150/88  03/30/15 140/90  07/19/14 132/80  A/P: Will add chlorthalidone 213mas well and recheck 2-3 months (total of atenolol 5033mnd chlorthalidone 67m66mMay help with edema. Continue kdur 10me68mily- consider repeat bmet next visit to check potassium

## 2016-04-07 NOTE — Assessment & Plan Note (Signed)
S: patient aware of need for weight loss- counseling provided A/P: focus on dash type diet to help with BP and weight. Follow up 3 months.

## 2016-04-07 NOTE — Progress Notes (Addendum)
Phone: [phone]  Subjective:  Patient presents today for their annual physical. Chief complaint-noted.   See problem oriented charting- ROS- full  review of systems was completed and negative except for: Bps noted to be running high at home but No chest pain or shortness of breath. No headache or blurry vision.   The following were reviewed and entered/updated in epic: Past Medical History  Diagnosis Date  . Allergy   . Hyperlipidemia   . Hypertension   . Crohn's disease ([place])     colon resection in 20. Dr. Carlean Purl unsure of diagnosis- no evidence on colonoscopy  . Uterine cancer [facility])     s/p hysterectomy   Patient Active Problem List   Diagnosis Date Noted  . Hyperglycemia 04/07/2016    Priority: Medium  . HYPERLIPIDEMIA 06/02/2007    Priority: Medium  . Essential hypertension 06/02/2007    Priority: Medium  . Chest pain 03/30/2014    Priority: Low  . Obese 03/07/2011    Priority: Low  . Osteoarthritis of both knees 01/10/2010    Priority: Low  . Edema 01/10/2010    Priority: Low  . ALLERGIC RHINITIS 06/02/2007    Priority: Low  . Crohn's disease [facility])    Past Surgical History  Procedure Laterality Date  . Appendectomy    . Abdominal hysterectomy    . Oophorectomy      x1    Family History  Problem Relation Age of Onset  . Uterine cancer Mother   . Alzheimer's disease Mother   . Heart disease Father   . Hypertension Father   . Cancer Father     b cell lymphoma  . CVA Father     passed from stroke 39    Medications- reviewed and updated Current Outpatient Prescriptions  Medication Sig Dispense Refill  . aspirin 81 MG tablet Take 81 mg by mouth daily.    Marland Kitchen atenolol-chlorthalidone (TENORETIC) 50-25 MG per tablet Take 1 tablet by mouth daily. 90 tablet 3  . atorvastatin (LIPITOR) 20 MG tablet Take 1 tablet (20 mg total) by mouth daily. 90 tablet 3  . loratadine (CLARITIN) 10 MG tablet Take 10 mg by mouth daily as needed.    . multivitamin  (THERAGRAN) per tablet Take 1 tablet by mouth daily.      . potassium chloride (K-DUR) 10 MEQ tablet Take 1 tablet (10 mEq total) by mouth daily. 90 tablet 3  . neomycin-polymyxin-hydrocortisone (CORTISPORIN) otic solution Place 3 drops into the left ear 4 (four) times daily. (Patient not taking: Reported on 04/07/2016) 10 mL 0   No current facility-administered medications for this visit.    Allergies-reviewed and updated Allergies  Allergen Reactions  . Meperidine Hcl     REACTION: unspecified  . Sulfamethoxazole-Trimethoprim     REACTION: unspecified    Social History   Social History  . Marital Status: Married    Spouse Name: N/A  . Number of Children: N/A  . Years of Education: N/A   Social History Main Topics  . Smoking status: Never Smoker   . Smokeless tobacco: Never Used  . Alcohol Use: No  . Drug Use: No  . Sexual Activity: Yes   Other Topics Concern  . None   Social History Narrative   Married 44 years in 2017. 3 children. 7 grandchildren. 3 greatgrandchildren.       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 Oropharynx normal Breasts: normal appearance, no masses or tenderness Neck: no thyromegaly CV: RRR no murmurs rubs or gallops Lungs: CTAB no crackles, wheeze, rhonchi Abdomen: soft/nontender/nondistended/normal bowel sounds. obese  Ext: no edema Skin: warm, dry Neuro: grossly normal, moves all extremities, PERRLA  Assessment/Plan:   60 y.o. female presenting for annual physical.  Health Maintenance counseling: 1. Anticipatory guidance: Patient counseled regarding regular dental exams, eye exams, wearing seatbelts.  2. Risk factor reduction:  Advised patient  of need for regular exercise and diet rich and fruits and vegetables to reduce risk of heart attack and stroke.  3. Immunizations/screenings/ancillary studies Immunization History  Administered Date(s) Administered  . Influenza Split 07/21/2011, 08/06/2012  . Influenza Whole 08/24/2009  . Influenza, Seasonal, Injecte, Preservative Fre 07/18/2015  . Influenza,inj,Quad PF,36+ Mos 07/27/2013  . Influenza-Unspecified 07/27/2014  . Td 10/20/2005  . Tdap 04/07/2016  . Zoster 03/15/2014   Health Maintenance Due  Topic Date Due  . Hepatitis C Screening -today [birth year]  . HIV Screening - today [birth year]1969  . TETANUS/TDAP - today  10/21/2015   4. Cervical cancer screening- normal 2016 with final in 3 years but could consid 5. Breast cancer screening-  breast exam today  and mammogram 04/08/15 normal- needs repeat this year with family history 6. Colon cancer screening - 03/10/08 with 10 year follow up  Status of chronic or acute concerns  HLD- atorvastatin 59m, update cholesterol today  Hyperglycemia- a1c and fasting sugar today- needs weight loss. Mom with history DM.    Essential hypertension S: atenolol- chlorthalidone 50-273mfor prolonged period. In recent months has noted a slow trend upwards with home BP Home readings 140s and 150s.poor control in office and at home at this point. She is asymptomatic though. BP Readings from Last 3 Encounters:  04/07/16 150/88  03/30/15 140/90  07/19/14 132/80  A/P: Will add chlorthalidone 2523ms well and recheck 2-3 months (total of atenolol 33m19md chlorthalidone 33mg66may help with edema. Continue kdur 10meq66mly- consider repeat bmet next visit to check potassium    [place] need for [place]- counseling provided A/P: focus on dash type diet to help with BP and weight. Follow up 3 months.      Return in about 3 months (around 07/08/2016).  Orders Placed This Encounter  Procedures  . Tdap vaccine  greater than or equal to 7yo IM  . CBC    [place]  . Comprehensive metabolic panel    [place]    Order Specific Question:  Has the patient fasted?    Answer:  No  . Lipid panel    [place]    Order Specific Question:  Has the patient fasted?    Answer:  No  . TSH    [place]  . Hepatitis C antibody, reflex    [facility]  . HIV antibody    Meds ordered this encounter  Medications  . chlorthalidone (HYGROTON) 25 MG tablet    Sig: Take 1 tablet (25 mg total) by mouth daily.    Dispense:  90 tablet    Refill:  3    Return precautions advised.   StepheGarret Reddish

## 2016-04-08 LAB — HEPATITIS C ANTIBODY: HCV Ab: NEGATIVE

## 2016-04-08 MED FILL — ATORVASTATIN 20 MG TABLET: 20 | 90 days supply | Qty: 90 | Fill #0

## 2016-04-08 MED FILL — ATENOLOL/CHLORTHAL 50/25: 50-25 | 90 days supply | Qty: 90 | Fill #0

## 2016-04-08 MED FILL — KLOR-CON M10 TABLET: 10 | 90 days supply | Qty: 90 | Fill #0

## 2016-04-08 MED FILL — FLUTICASONE PROP 50 MCG SPR: 50 | 30 days supply | Qty: 16 | Fill #0

## 2016-06-11 MED FILL — FLUTICASONE PROP 50 MCG SPR: 50 | 30 days supply | Qty: 16 | Fill #1

## 2016-07-04 MED FILL — ATORVASTATIN 20 MG TABLET: 20 | 90 days supply | Qty: 90 | Fill #1

## 2016-07-04 MED FILL — ATENOLOL/CHLORTHAL 50/25: 50-25 | 90 days supply | Qty: 90 | Fill #1

## 2016-07-04 MED FILL — CHLORTHALIDONE 25 MG TABLET: 25 | 90 days supply | Qty: 90 | Fill #1

## 2016-07-04 MED FILL — KLOR-CON M10 TABLET: 10 | 90 days supply | Qty: 90 | Fill #1

## 2016-07-08 ENCOUNTER — Ambulatory Visit (INDEPENDENT_AMBULATORY_CARE_PROVIDER_SITE_OTHER): Payer: 59 | Admitting: Family Medicine

## 2016-07-08 ENCOUNTER — Encounter: Payer: Self-pay | Admitting: Family Medicine

## 2016-07-08 VITALS — BP 114/88 | HR 84 | Temp 98.7°F | Wt 253.2 lb

## 2016-07-08 DIAGNOSIS — R739 Hyperglycemia, unspecified: Secondary | ICD-10-CM | POA: Diagnosis not present

## 2016-07-08 DIAGNOSIS — I10 Essential (primary) hypertension: Secondary | ICD-10-CM

## 2016-07-08 LAB — BASIC METABOLIC PANEL
BUN: 14 mg/dL (ref 6–23)
CO2: 34 mEq/L — ABNORMAL HIGH (ref 19–32)
Calcium: 9.5 mg/dL (ref 8.4–10.5)
Chloride: 99 mEq/L (ref 96–112)
Creatinine, Ser: 0.8 mg/dL (ref 0.40–1.20)
GFR: 77.15 mL/min (ref >60.00)
Glucose, Bld: 99 mg/dL (ref 70–99)
POTASSIUM: 3.8 meq/L (ref 3.5–5.1)
Sodium: 139 mEq/L (ref 135–145)

## 2016-07-08 LAB — HEMOGLOBIN A1C: HEMOGLOBIN A1C: 5.8 % (ref 4.6–6.5)

## 2016-07-08 NOTE — Progress Notes (Addendum)
Subjective:  Savannah Morrow is a 60 y.o. year old very pleasant female patient who presents for/with See problem oriented charting ROS- No chest pain or shortness of breath. No headache or blurry vision.see any ROS included in HPI as well.   Past Medical History-  Patient Active Problem List   Diagnosis Date Noted  . Morbid obesity ([place]) 03/07/2011    Priority: High  . Hyperglycemia 04/07/2016    Priority: Medium  . HYPERLIPIDEMIA 06/02/2007    Priority: Medium  . Essential hypertension 06/02/2007    Priority: Medium  . Chest pain 03/30/2014    Priority: Low  . Osteoarthritis of both knees 01/10/2010    Priority: Low  . Edema 01/10/2010    Priority: Low  . ALLERGIC RHINITIS 06/02/2007    Priority: Low  . Crohn's disease ([place])     Medications- reviewed and updated Current Outpatient Prescriptions  Medication Sig Dispense Refill  . atenolol-chlorthalidone (TENORETIC) 50-25 MG tablet Take 1 tablet by mouth daily. 90 tablet 3  . atorvastatin (LIPITOR) 20 MG tablet Take 1 tablet (20 mg total) by mouth daily. 90 tablet 3  . chlorthalidone (HYGROTON) 25 MG tablet Take 1 tablet (25 mg total) by mouth daily. 90 tablet 3  . fluticasone (FLONASE) 50 MCG/ACT nasal spray Place 1 spray into both nostrils daily. 16 g 6  . loratadine (CLARITIN) 10 MG tablet Take 10 mg by mouth daily as needed.    . multivitamin (THERAGRAN) per tablet Take 1 tablet by mouth daily.      . potassium chloride (K-DUR) 10 MEQ tablet Take 1 tablet (10 mEq total) by mouth daily. 90 tablet 3   No current facility-administered medications for this visit.     Objective: BP 114/88 (BP Location: Left Arm, Patient Position: Sitting, Cuff Size: Large)   Pulse 84   Temp 98.7 F (37.1 C) (Oral)   Wt 253 lb 3.2 oz (114.9 kg)   SpO2 96%   BMI 46.69 kg/m  Gen: NAD, resting comfortably CV: RRR no murmurs rubs or gallops Lungs: CTAB no crackles, wheeze, rhonchi Abdomen: soft/nontender/nondistended/normal bowel sounds.  Morbid obesity Ext: 1+ edema Skin: warm, dry Neuro: grossly normal, moves all extremities  Assessment/Plan:   Essential hypertension S: controlled now on additional 49m chlorthalidone to the baseline Atenolol-chlorthalidone 50-296m  BP Readings from Last 3 Encounters:  07/08/16 114/88  04/07/16 (!) 150/88  03/30/15 140/90  A/P:Continue current meds:  Update bmet due to potassium running low in past- may need to bump dose   Hyperglycemia S: CBGs at risk diabetes. Only 1 lb down. Seeing nutritionist through [facility] soon Lab Results  Component Value Date   HGBA1C 5.7 03/07/2013  A/P: continue weight loss efforts. Check a1c today   Morbid obesity (HCC) S: weight only down 1 lb. About to see nutrionist A/P: Encouraged need for healthy eating, regular exercise, weight loss. Glad for nutrition appointment   3-6 months depending on a1c Orders Placed This Encounter  Procedures  . Basic metabolic panel    [place]  . Hemoglobin A1c    [place]   Return precautions advised.  StGarret ReddishMD

## 2016-07-08 NOTE — Patient Instructions (Signed)
Glad epic has gone live! Your blood pressure has responded well to decreased stress and new chlorthalidone dose. Continue current meds. Check potassium before you leave  Weight loss is key for your health in the long run. Thrilled you are seeing nutritionist. Let's plan on 3-6 month follow up- sooner if a1c above 6, later if remains around 5.7. I do consider metformin to help prevent diabetes if a1c above 6- so do some thinking on that. Goal exercise 150 minutes a week. Goal veggies 3-5 servings. Goal fruit 2-4 servings a day.   Labs before you go- a1c and bmet

## 2016-07-08 NOTE — Assessment & Plan Note (Signed)
S: controlled now on additional 34m chlorthalidone to the baseline Atenolol-chlorthalidone 50-258m  BP Readings from Last 3 Encounters:  07/08/16 114/88  04/07/16 (!) 150/88  03/30/15 140/90  A/P:Continue current meds:  Update bmet due to potassium running low in past- may need to bump dose

## 2016-07-08 NOTE — Assessment & Plan Note (Signed)
S: CBGs at risk diabetes. Only 1 lb down. Seeing nutritionist through [facility] soon Lab Results  Component Value Date   HGBA1C 5.7 03/07/2013  A/P: continue weight loss efforts. Check a1c today

## 2016-07-08 NOTE — Assessment & Plan Note (Signed)
S: weight only down 1 lb. About to see nutrionist A/P: Encouraged need for healthy eating, regular exercise, weight loss. Glad for nutrition appointment

## 2016-07-08 NOTE — Progress Notes (Signed)
Pre visit review using our clinic review tool, if applicable. No additional management support is needed unless otherwise documented below in the visit note. 

## 2016-08-01 ENCOUNTER — Encounter: Payer: Self-pay | Admitting: Family Medicine

## 2016-09-16 MED FILL — FLUTICASONE PROP 50 MCG SPR: 50 | 30 days supply | Qty: 16 | Fill #2

## 2016-10-02 MED FILL — KLOR-CON M10 TABLET: 10 | 90 days supply | Qty: 90 | Fill #2

## 2016-10-02 MED FILL — ATORVASTATIN 20 MG TABLET: 20 | 90 days supply | Qty: 90 | Fill #2

## 2016-10-02 MED FILL — ATENOLOL/CHLORTHAL 50/25: 50-25 | 90 days supply | Qty: 90 | Fill #2

## 2016-12-05 MED FILL — FLUTICASONE PROP 50 MCG SPR: 50 | 30 days supply | Qty: 16 | Fill #3

## 2016-12-26 ENCOUNTER — Telehealth: Payer: 59 | Admitting: Nurse Practitioner

## 2016-12-26 DIAGNOSIS — H578 Other specified disorders of eye and adnexa: Secondary | ICD-10-CM | POA: Diagnosis not present

## 2016-12-26 DIAGNOSIS — H5789 Other specified disorders of eye and adnexa: Secondary | ICD-10-CM

## 2016-12-26 MED ORDER — OFLOXACIN 0.3 % OP SOLN: 1.0000 [drp] | Freq: Four times a day (QID) | OPHTHALMIC | 0 refills | Status: DC

## 2016-12-26 NOTE — Progress Notes (Signed)

## 2017-01-07 MED FILL — ATENOLOL/CHLORTHAL 50/25: 50-25 | 90 days supply | Qty: 90 | Fill #3

## 2017-01-07 MED FILL — ATORVASTATIN 20 MG TABLET: 20 | 90 days supply | Qty: 90 | Fill #3

## 2017-01-07 MED FILL — KLOR-CON M10 TABLET: 10 | 90 days supply | Qty: 90 | Fill #3

## 2017-02-12 MED FILL — FLUTICASONE PROP 50 MCG SPR: 50 | 60 days supply | Qty: 16 | Fill #4

## 2017-03-17 ENCOUNTER — Other Ambulatory Visit: Payer: Self-pay | Admitting: Family Medicine

## 2017-03-17 DIAGNOSIS — Z1231 Encounter for screening mammogram for malignant neoplasm of breast: Secondary | ICD-10-CM

## 2017-03-31 DIAGNOSIS — D225 Melanocytic nevi of trunk: Secondary | ICD-10-CM | POA: Diagnosis not present

## 2017-03-31 DIAGNOSIS — L851 Acquired keratosis [keratoderma] palmaris et plantaris: Secondary | ICD-10-CM | POA: Diagnosis not present

## 2017-04-01 ENCOUNTER — Ambulatory Visit
Admission: RE | Admit: 2017-04-01 | Discharge: 2017-04-01 | Disposition: A | Discharge status: Home / Self Care | Payer: 59 | Source: Ambulatory Visit | Attending: Family Medicine | Admitting: Family Medicine

## 2017-04-01 DIAGNOSIS — Z1231 Encounter for screening mammogram for malignant neoplasm of breast: Secondary | ICD-10-CM | POA: Diagnosis not present

## 2017-04-07 ENCOUNTER — Encounter: Payer: Self-pay | Admitting: Family Medicine

## 2017-04-07 ENCOUNTER — Ambulatory Visit (INDEPENDENT_AMBULATORY_CARE_PROVIDER_SITE_OTHER): Payer: 59 | Admitting: Family Medicine

## 2017-04-07 ENCOUNTER — Other Ambulatory Visit: Payer: Self-pay

## 2017-04-07 VITALS — BP 130/78 | HR 79 | Temp 99.0°F | Ht 63.5 in | Wt 245.8 lb

## 2017-04-07 DIAGNOSIS — I10 Essential (primary) hypertension: Secondary | ICD-10-CM

## 2017-04-07 DIAGNOSIS — R739 Hyperglycemia, unspecified: Secondary | ICD-10-CM | POA: Diagnosis not present

## 2017-04-07 DIAGNOSIS — E785 Hyperlipidemia, unspecified: Secondary | ICD-10-CM

## 2017-04-07 DIAGNOSIS — Z0000 Encounter for general adult medical examination without abnormal findings: Secondary | ICD-10-CM

## 2017-04-07 DIAGNOSIS — K50919 Crohn's disease, unspecified, with unspecified complications: Secondary | ICD-10-CM | POA: Diagnosis not present

## 2017-04-07 LAB — CBC
HCT: 41.3 % (ref 36.0–46.0)
Hemoglobin: 13.7 g/dL (ref 12.0–15.0)
MCHC: 33.2 g/dL (ref 30.0–36.0)
MCV: 87.5 fl (ref 78.0–100.0)
PLATELETS: 283 10*3/uL (ref 150.0–400.0)
RBC: 4.72 Mil/uL (ref 3.87–5.11)
RDW: 14.5 % (ref 11.5–15.5)
WBC: 6.6 10*3/uL (ref 4.0–10.5)

## 2017-04-07 LAB — COMPREHENSIVE METABOLIC PANEL
ALBUMIN: 4.2 g/dL (ref 3.5–5.2)
ALT: 17 U/L (ref 0–35)
AST: 15 U/L (ref 0–37)
Alkaline Phosphatase: 85 U/L (ref 39–117)
BUN: 16 mg/dL (ref 6–23)
CALCIUM: 9.6 mg/dL (ref 8.4–10.5)
CO2: 33 mEq/L — ABNORMAL HIGH (ref 19–32)
CREATININE: 0.78 mg/dL (ref 0.40–1.20)
Chloride: 99 mEq/L (ref 96–112)
GFR: 79.25 mL/min (ref >60.00)
Glucose, Bld: 101 mg/dL — ABNORMAL HIGH (ref 70–99)
Potassium: 3.8 mEq/L (ref 3.5–5.1)
Sodium: 140 mEq/L (ref 135–145)
Total Bilirubin: 0.8 mg/dL (ref 0.2–1.2)
Total Protein: 7 g/dL (ref 6.0–8.3)

## 2017-04-07 LAB — LIPID PANEL
Cholesterol: 133 mg/dL (ref 0–200)
HDL: 39.6 mg/dL (ref >39.00)
LDL Cholesterol: 71 mg/dL (ref 0–99)
NonHDL: 93.19
Total CHOL/HDL Ratio: 3
Triglycerides: 113 mg/dL (ref 0.0–149.0)
VLDL: 22.6 mg/dL (ref 0.0–40.0)

## 2017-04-07 MED ORDER — POTASSIUM CHLORIDE ER 10 MEQ PO TBCR: 10.0000 meq | EXTENDED_RELEASE_TABLET | Freq: Every day | ORAL | 3 refills | Status: DC

## 2017-04-07 MED ORDER — ATENOLOL-CHLORTHALIDONE 50-25 MG PO TABS: 1.0000 | ORAL_TABLET | Freq: Every day | ORAL | 3 refills | Status: DC

## 2017-04-07 MED ORDER — FLUTICASONE PROPIONATE 50 MCG/ACT NA SUSP: 1.0000 | Freq: Every day | NASAL | 6 refills | Status: DC

## 2017-04-07 MED ORDER — ATORVASTATIN CALCIUM 20 MG PO TABS: 20.0000 mg | ORAL_TABLET | Freq: Every day | ORAL | 3 refills | Status: DC

## 2017-04-07 MED FILL — POTASSIUM CL 10 MEQ TAB SA: 10 | 90 days supply | Qty: 90 | Fill #0

## 2017-04-07 MED FILL — ATENOLOL/CHLORTHAL 50/25: 50-25 | 90 days supply | Qty: 90 | Fill #0

## 2017-04-07 MED FILL — FLUTICASONE PROP 50 MCG SPR: 50 | 60 days supply | Qty: 16 | Fill #0

## 2017-04-07 MED FILL — ATORVASTATIN 20 MG TABLET: 20 | 90 days supply | Qty: 90 | Fill #0

## 2017-04-07 NOTE — Assessment & Plan Note (Signed)
Down 8 lbs! Continue regular walking, more plant based diet. She is hoping to get under 200

## 2017-04-07 NOTE — Patient Instructions (Signed)
Great job with weight loss. Lets follow up in 6 months hoping for at least another 5-10 lbs off and to monitor blood pressure  Obviously let us know if any abdominal pain or blood in stool but glad prior ? Crohn's has been doing well

## 2017-04-07 NOTE — Assessment & Plan Note (Signed)
colon resection in 20s. Dr. Carlean Purl unsure of diagnosis- no evidence on colonoscopy Asymptomatic- continue to monitor

## 2017-04-07 NOTE — Assessment & Plan Note (Signed)
S:  Weight down 8 lbs Lab Results  Component Value Date   HGBA1C 5.8 07/08/2016  A/P: declines a1c today, agrees to cbg. Continue weight loss efforts

## 2017-04-07 NOTE — Assessment & Plan Note (Signed)
S: controlled on atenolol-chlorthalidone 50-10m . Came off additional chlorthalidone 235min last 3 months- BP still looks good ASCVD 10 year risk calculation if age 63-79atorvastatin BP Readings from Last 3 Encounters:  04/07/17 128/90  07/08/16 114/88  04/07/16 (!) 150/88  A/P: We discussed blood pressure goal of <140/90. Continue current meds atenolol-chlorthalidone 50-2553mlone- better with weight loss

## 2017-04-07 NOTE — Assessment & Plan Note (Addendum)
S: well controlled on atorvastatin 38m. No myalgias. Also on aspirin Lab Results  Component Value Date   CHOL 132 04/07/2016   HDL 36.70 (L) 04/07/2016   LDLCALC 72 04/07/2016   TRIG 117.0 04/07/2016   CHOLHDL 4 04/07/2016   A/P: LDL at 72 last year- update today. Increased exercise so hoping HDL goes up

## 2017-04-07 NOTE — Progress Notes (Signed)
Phone: [phone]  Subjective:  Patient presents today for their annual physical. Chief complaint-noted.   See problem oriented charting- ROS- full  review of systems was completed and negative except for: had a few palpitations when on additional chlorthalidone that resolved off of it (possible potassium issue- also seemed to improve with bananas)  The following were reviewed and entered/updated in epic: Past Medical History:  Diagnosis Date  . Allergy   . Crohn's disease ([place])    colon resection in 20. Dr. Carlean Purl unsure of diagnosis- no evidence on colonoscopy  . Hyperlipidemia   . Hypertension   . Uterine cancer [facility])    s/p hysterectomy   Patient Active Problem List   Diagnosis Date Noted  . Morbid obesity ([place]) 03/07/2011    Priority: High  . Hyperglycemia 04/07/2016    Priority: Medium  . Crohn's disease ([place])     Priority: Medium  . Hyperlipidemia 06/02/2007    Priority: Medium  . Essential hypertension 06/02/2007    Priority: Medium  . Chest pain 03/30/2014    Priority: Low  . Osteoarthritis of both knees 01/10/2010    Priority: Low  . Edema 01/10/2010    Priority: Low  . ALLERGIC RHINITIS 06/02/2007    Priority: Low   Past Surgical History:  Procedure Laterality Date  . ABDOMINAL HYSTERECTOMY    . APPENDECTOMY    . OOPHORECTOMY     x1    Family History  Problem Relation Age of Onset  . Uterine cancer Mother   . Alzheimer's disease Mother   . Heart disease Father   . Hypertension Father   . Cancer Father        b cell lymphoma  . CVA Father        passed from stroke 34    Medications- reviewed and updated Current Outpatient Prescriptions  Medication Sig Dispense Refill  . atenolol-chlorthalidone (TENORETIC) 50-25 MG tablet Take 1 tablet by mouth daily. 90 tablet 3  . atorvastatin (LIPITOR) 20 MG tablet Take 1 tablet (20 mg total) by mouth daily. 90 tablet 3  . fluticasone (FLONASE) 50 MCG/ACT nasal spray Place 1 spray into both nostrils  daily. 16 g 6  . loratadine (CLARITIN) 10 MG tablet Take 10 mg by mouth daily as needed.    . multivitamin (THERAGRAN) per tablet Take 1 tablet by mouth daily.      . potassium chloride (K-DUR) 10 MEQ tablet Take 1 tablet (10 mEq total) by mouth daily. 90 tablet 3   No current facility-administered medications for this visit.     Allergies-reviewed and updated Allergies  Allergen Reactions  . Meperidine Hcl     REACTION: unspecified  . Sulfamethoxazole-Trimethoprim     REACTION: unspecified    Social History   Social History  . Marital status: Married    Spouse name: N/A  . Number of children: N/A  . Years of education: N/A   Social History Main Topics  . Smoking status: Never Smoker  . Smokeless tobacco: Never Used  . Alcohol use No  . Drug use: No  . Sexual activity: Yes   Other Topics Concern  . None   Social History Narrative   Married 44 years in 2017. 3 children. 7 grandchildren. 3 greatgrandchildren.       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 Oropharynx normal Neck: no thyromegaly CV: RRR no murmurs rubs or gallops Lungs: CTAB no crackles, wheeze, rhonchi Abdomen: soft/nontender/nondistended/normal bowel sounds. No rebound or guarding.  Ext: no edema Skin: warm, dry Neuro: grossly normal, moves all extremities, PERRLA  Assessment/Plan:  61 y.o. female presenting for annual physical.  Health Maintenance counseling: 1. Anticipatory guidance: Patient counseled regarding regular dental exams - q6 months Dr. Loma Newton, eye exams - yearly, wearing seatbelts.  2. Risk factor reduction:  Advised patient of need for regular exercise and diet rich and fruits and vegetables  to reduce risk of heart attack and stroke. Exercise- walking 5 days a week- 30 minutes. Diet-has moved to more plant based- increased fruits/veggies. Water only. Down 8 lbs since last visit Wt Readings from Last 3 Encounters:  04/07/17 245 lb 12.8 oz (111.5 kg)  07/08/16 253 lb 3.2 oz (114.9 kg)  04/07/16 254 lb (115.2 kg)  3. Immunizations/screenings/ancillary studies- offered shingrix - plans for next year Immunization History  Administered Date(s) Administered  . Influenza Split 07/21/2011, 08/06/2012  . Influenza Whole 08/24/2009  . Influenza, Seasonal, Injecte, Preservative Fre 07/18/2015  . Influenza,inj,Quad PF,36+ Mos 07/27/2013  . Influenza-Unspecified 07/27/2014, 08/01/2016  . Td 10/20/2005  . Tdap 04/07/2016  . Zoster 03/15/2014  4. Cervical cancer screening- normal 2016. Hysterectomy including cervix in 1989 for uterine cancer. Has had normal pap smears every year or few years since then. She would like to discontinue screenings and given cervix is no longer present- agreed to stop 5. Breast cancer screening-  breast exam declined and mammogram 04/01/17 6. Colon cancer screening - 02/2008 with 10 year follow up 7. Skin cancer screening- saw dermatology Dr. Allyn Kenner- ended up being benign and had full skin exam  Status of chronic or acute concerns   Allergic rhinitis- using flonase and claritin  Hyperlipidemia S: well controlled on atorvastatin 58m. No myalgias. Also on aspirin Lab Results  Component Value Date   CHOL 132 04/07/2016   HDL 36.70 (L) 04/07/2016   LDLCALC 72 04/07/2016   TRIG 117.0 04/07/2016   CHOLHDL 4 04/07/2016   A/P: LDL at 72 last year- update today. Increased exercise so hoping HDL goes up  Hyperglycemia S:  Weight down 8 lbs Lab Results  Component Value Date   HGBA1C 5.8 07/08/2016  A/P: declines a1c today, agrees to cbg. Continue weight loss efforts  Essential hypertension S: controlled on atenolol-chlorthalidone 50-269m. Came off  additional chlorthalidone 2540mn last 3 months- BP still looks good ASCVD 10 year risk calculation if age 46-56-79torvastatin BP Readings from Last 3 Encounters:  04/07/17 128/90  07/08/16 114/88  04/07/16 (!) 150/88  A/P: We discussed blood pressure goal of <140/90. Continue current meds atenolol-chlorthalidone 50-72m60mone- better with weight loss  Morbid obesity ([facility]) Down 8 lbs! Continue regular walking, more plant based diet. She is hoping to get under 200  Crohn's disease ([facility])Emmettlon resection in 20s. Dr. GessCarlean Purlure of diagnosis- no evidence on colonoscopy Asymptomatic- continue to monitor  6 month   Orders Placed This Encounter  Procedures  . CBC    [place]  . Comprehensive metabolic panel        Order Specific Question:   Has the patient fasted?    Answer:   No  .  Lipid panel    [place]    Order Specific Question:   Has the patient fasted?    Answer:   No   Return precautions advised.  Garret Reddish, MD

## 2017-06-01 MED FILL — FLUTICASONE PROP 50 MCG SPR: 50 | 60 days supply | Qty: 16 | Fill #1

## 2017-07-10 MED FILL — ATORVASTATIN 20 MG TABLET: 20 | 90 days supply | Qty: 90 | Fill #1

## 2017-07-10 MED FILL — POTASSIUM CL 10 MEQ TAB SA: 10 | 90 days supply | Qty: 90 | Fill #1

## 2017-07-10 MED FILL — ATENOLOL/CHLORTHAL 50/25: 50-25 | 90 days supply | Qty: 90 | Fill #1

## 2017-07-27 ENCOUNTER — Encounter: Payer: Self-pay | Admitting: Family Medicine

## 2017-08-12 MED FILL — FLUTICASONE PROP 50 MCG SPR: 50 | 60 days supply | Qty: 16 | Fill #2

## 2017-10-06 ENCOUNTER — Ambulatory Visit (INDEPENDENT_AMBULATORY_CARE_PROVIDER_SITE_OTHER): Payer: 59 | Admitting: Family Medicine

## 2017-10-06 ENCOUNTER — Encounter: Payer: Self-pay | Admitting: Family Medicine

## 2017-10-06 VITALS — BP 162/86 | HR 63 | Temp 98.3°F | Ht 63.5 in | Wt 238.8 lb

## 2017-10-06 DIAGNOSIS — E785 Hyperlipidemia, unspecified: Secondary | ICD-10-CM | POA: Diagnosis not present

## 2017-10-06 DIAGNOSIS — Z823 Family history of stroke: Secondary | ICD-10-CM | POA: Insufficient documentation

## 2017-10-06 DIAGNOSIS — I10 Essential (primary) hypertension: Secondary | ICD-10-CM

## 2017-10-06 DIAGNOSIS — R739 Hyperglycemia, unspecified: Secondary | ICD-10-CM

## 2017-10-06 LAB — BASIC METABOLIC PANEL
BUN: 15 mg/dL (ref 6–23)
CO2: 31 mEq/L (ref 19–32)
Calcium: 9.5 mg/dL (ref 8.4–10.5)
Chloride: 100 mEq/L (ref 96–112)
Creatinine, Ser: 0.75 mg/dL (ref 0.40–1.20)
GFR: 82.79 mL/min (ref >60.00)
Glucose, Bld: 101 mg/dL — ABNORMAL HIGH (ref 70–99)
Potassium: 4.2 mEq/L (ref 3.5–5.1)
Sodium: 140 mEq/L (ref 135–145)

## 2017-10-06 LAB — HEMOGLOBIN A1C: HEMOGLOBIN A1C: 5.8 % (ref 4.6–6.5)

## 2017-10-06 LAB — LDL CHOLESTEROL, DIRECT: Direct LDL: 65 mg/dL

## 2017-10-06 MED FILL — POTASSIUM CL 10 MEQ TAB SA: 10 | 90 days supply | Qty: 90 | Fill #2

## 2017-10-06 MED FILL — ATENOLOL/CHLORTHAL 50/25: 50-25 | 90 days supply | Qty: 90 | Fill #2

## 2017-10-06 MED FILL — FLUTICASONE PROP 50 MCG SPR: 50 | 60 days supply | Qty: 16 | Fill #3

## 2017-10-06 MED FILL — ATORVASTATIN 20 MG TABLET: 20 | 90 days supply | Qty: 90 | Fill #2

## 2017-10-06 NOTE — Progress Notes (Signed)
Subjective:  Savannah Morrow is a 61 y.o. year old very pleasant female patient who presents for/with See problem oriented charting ROS- No chest pain or shortness of breath. No headache or blurry vision.    Past Medical History-  Patient Active Problem List   Diagnosis Date Noted  . Morbid obesity ([place]) 03/07/2011    Priority: High  . Family history of cerebrovascular accident (CVA) in sister 10/06/2017    Priority: Medium  . Hyperglycemia 04/07/2016    Priority: Medium  . Crohn's disease ([place])     Priority: Medium  . Hyperlipidemia 06/02/2007    Priority: Medium  . Essential hypertension 06/02/2007    Priority: Medium  . Chest pain 03/30/2014    Priority: Low  . Osteoarthritis of both knees 01/10/2010    Priority: Low  . Edema 01/10/2010    Priority: Low  . ALLERGIC RHINITIS 06/02/2007    Priority: Low   Medications- reviewed and updated Current Outpatient Medications  Medication Sig Dispense Refill  . atenolol-chlorthalidone (TENORETIC) 50-25 MG tablet Take 1 tablet by mouth daily. 90 tablet 3  . atorvastatin (LIPITOR) 20 MG tablet Take 1 tablet (20 mg total) by mouth daily. 90 tablet 3  . fluticasone (FLONASE) 50 MCG/ACT nasal spray Place 1 spray into both nostrils daily. 16 g 6  . loratadine (CLARITIN) 10 MG tablet Take 10 mg by mouth daily as needed.    . multivitamin (THERAGRAN) per tablet Take 1 tablet by mouth daily.      . potassium chloride (K-DUR) 10 MEQ tablet Take 1 tablet (10 mEq total) by mouth daily. 90 tablet 3   No current facility-administered medications for this visit.     Objective: BP (!) 162/86 (BP Location: Left Arm, Patient Position: Sitting, Cuff Size: Large)   Pulse 63   Temp 98.3 F (36.8 C) (Oral)   Ht 5' 3.5" (1.613 m)   Wt 238 lb 12.8 oz (108.3 kg)   SpO2 99%   BMI 41.64 kg/m  Gen: NAD, resting comfortably CV: RRR no murmurs rubs or gallops Lungs: CTAB no crackles, wheeze, rhonchi Abdomen: soft/nontender/nondistended/normal bowel  sounds. obese Ext: no edema Skin: warm, dry  Assessment/Plan:  Essential hypertension S: controlled on  atenolol-chlorthalidone 50-26m at last viist. She did not take her medicine yet today and has been about 26 hours from last dose. Also on potassium with this BP Readings from Last 3 Encounters:  10/06/17 (!) 162/86  04/07/17 130/78  07/08/16 114/88  A/P: We discussed blood pressure goal of <140/90. 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 If elevated, would have her back ASAP  Hyperglycemia S: weight down 7 lbs since last visit. Started weight watchers on 08/06/17. Had gained som weight since last visit then has lost a total of 17 from her peak. a1c last September was 5.8.  Wt Readings from Last 3 Encounters:  10/06/17 238 lb 12.8 oz (108.3 kg)  04/07/17 245 lb 12.8 oz (111.5 kg)  07/08/16 253 lb 3.2 oz (114.9 kg)  A/P: luckily a1c stable today at 5.8 with no increased risk of diabetes compared to last year  Hyperlipidemia S: very well controlled on atorvastatin 264m No myalgias.   A/P: LDL now at 6525hich is ideal. Continue current meds  Family history of cerebrovascular accident (CVA) in sister Sister with AVM leading to major bleed and ultimately being in LTach. She knows if headache  or abnormal neurological symptoms then needs immediate evaluation. Patient does not want to be scanned for screening- sisters neurologist recommended family screening.  Morbid obesity ([place]) Wt Readings from Last 3 Encounters:  10/06/17 238 lb 12.8 oz (108.3 kg)  04/07/17 245 lb 12.8 oz (111.5 kg)  07/08/16 253 lb 3.2 oz (114.9 kg)  Patient down 15 lbs over last few months- still hoping to get under 200- congratulated on effort. Weight watchers helping. Encouraged her to continue slow steady loss at least 5-10 lbs by follow up   Future Appointments  Date Time Provider [place]  04/14/2018  8:15 AM Marin Olp, MD [facility]   Return in about 27 weeks (around 04/13/2018) for physical.  Labs from today Results for orders placed or performed in visit on 10/06/17 (from the past 24 hour(s))  LDL cholesterol, direct     Status: None   Collection Time: 10/06/17  9:57 AM  Result Value Ref Range   Direct LDL 65.0 mg/dL  Basic metabolic panel     Status: Abnormal   Collection Time: 10/06/17  9:57 AM  Result Value Ref Range   Sodium 140 135 - 145 mEq/L   Potassium 4.2 3.5 - 5.1 mEq/L   Chloride 100 96 - 112 mEq/L   CO2 31 19 - 32 mEq/L   Glucose, Bld 101 (H) 70 - 99 mg/dL   BUN 15 6 - 23 mg/dL   Creatinine, Ser 0.75 0.40 - 1.20 mg/dL   Calcium 9.5 8.4 - 10.5 mg/dL   GFR 82.79 >60.00 mL/min  Hemoglobin A1c     Status: None   Collection Time: 10/06/17  9:57 AM  Result Value Ref Range   Hgb A1c MFr Bld 5.8 4.6 - 6.5 %    Return precautions advised.  Garret Reddish, MD

## 2017-10-06 NOTE — Assessment & Plan Note (Signed)
Wt Readings from Last 3 Encounters:  10/06/17 238 lb 12.8 oz (108.3 kg)  04/07/17 245 lb 12.8 oz (111.5 kg)  07/08/16 253 lb 3.2 oz (114.9 kg)  Patient down 15 lbs over last few months- still hoping to get under 200- congratulated on effort. Weight watchers helping. Encouraged her to continue slow steady loss at least 5-10 lbs by follow up

## 2017-10-06 NOTE — Assessment & Plan Note (Signed)
S: very well controlled on atorvastatin 45m. No myalgias.   A/P: LDL now at 65 which is ideal. Continue current meds

## 2017-10-06 NOTE — Assessment & Plan Note (Signed)
S: controlled on  atenolol-chlorthalidone 50-23m at last viist. She did not take her medicine yet today and has been about 26 hours from last dose. Also on potassium with this BP Readings from Last 3 Encounters:  10/06/17 (!) 162/86  04/07/17 130/78  07/08/16 114/88  A/P: We discussed blood pressure goal of <140/90. 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 If elevated, would have her back ASAP

## 2017-10-06 NOTE — Patient Instructions (Addendum)
Please stop by lab before you go  [place] on [employer] loss/healthy eating  Come fasting next visit as well- ok to take blood pressure medicine with water before you come  Make sure to let me know later this week how blood pressure looks at home back on medicine. If above 140/90 lets see you back sooner  Thanks for updating me on your sister- Im so sorry this happened to her and your family

## 2017-10-06 NOTE — Assessment & Plan Note (Signed)
Sister with AVM leading to major bleed and ultimately being in LTach. She knows if headache or abnormal neurological symptoms then needs immediate evaluation. Patient does not want to be scanned for screening- sisters neurologist recommended family screening.

## 2017-10-06 NOTE — Assessment & Plan Note (Signed)
S: weight down 7 lbs since last visit. Started weight watchers on 08/06/17. Had gained som weight since last visit then has lost a total of 17 from her peak. a1c last September was 5.8.  Wt Readings from Last 3 Encounters:  10/06/17 238 lb 12.8 oz (108.3 kg)  04/07/17 245 lb 12.8 oz (111.5 kg)  07/08/16 253 lb 3.2 oz (114.9 kg)  A/P: luckily a1c stable today at 5.8 with no increased risk of diabetes compared to last year

## 2017-10-07 ENCOUNTER — Encounter: Payer: Self-pay | Admitting: Family Medicine

## 2017-10-14 DIAGNOSIS — H52223 Regular astigmatism, bilateral: Secondary | ICD-10-CM | POA: Diagnosis not present

## 2017-10-14 DIAGNOSIS — H524 Presbyopia: Secondary | ICD-10-CM | POA: Diagnosis not present

## 2017-10-14 DIAGNOSIS — H5203 Hypermetropia, bilateral: Secondary | ICD-10-CM | POA: Diagnosis not present

## 2018-01-05 MED FILL — FLUTICASONE PROP 50 MCG SPR: 50 | 60 days supply | Qty: 16 | Fill #4

## 2018-01-05 MED FILL — ATENOLOL/CHLORTHAL 50/25: 50-25 | 90 days supply | Qty: 90 | Fill #3

## 2018-01-05 MED FILL — POTASSIUM CL 10 MEQ TAB SA: 10 | 90 days supply | Qty: 90 | Fill #3

## 2018-01-05 MED FILL — ATORVASTATIN 20 MG TABLET: 20 | 90 days supply | Qty: 90 | Fill #3

## 2018-02-11 ENCOUNTER — Encounter: Payer: Self-pay | Admitting: Internal Medicine

## 2018-02-22 ENCOUNTER — Other Ambulatory Visit: Payer: Self-pay

## 2018-02-22 ENCOUNTER — Ambulatory Visit (AMBULATORY_SURGERY_CENTER): Payer: Self-pay | Admitting: *Deleted

## 2018-02-22 VITALS — Ht 64.0 in | Wt 224.0 lb

## 2018-02-22 DIAGNOSIS — Z1211 Encounter for screening for malignant neoplasm of colon: Secondary | ICD-10-CM

## 2018-02-22 NOTE — Progress Notes (Signed)
Patient denies any allergies to eggs or soy. Patient denies any problems with anesthesia/sedation. Patient denies any oxygen use at home. Patient denies taking any diet/weight loss medications or blood thinners. EMMI education assisgned to patient on colonoscopy, this was explained and instructions given to patient. 

## 2018-02-23 ENCOUNTER — Encounter: Payer: Self-pay | Admitting: Internal Medicine

## 2018-02-24 ENCOUNTER — Ambulatory Visit: Payer: 59 | Admitting: Family Medicine

## 2018-02-24 ENCOUNTER — Encounter: Payer: Self-pay | Admitting: Family Medicine

## 2018-02-24 VITALS — BP 124/78 | HR 56 | Temp 98.2°F | Ht 63.5 in | Wt 223.4 lb

## 2018-02-24 DIAGNOSIS — J029 Acute pharyngitis, unspecified: Secondary | ICD-10-CM | POA: Diagnosis not present

## 2018-02-24 DIAGNOSIS — J039 Acute tonsillitis, unspecified: Secondary | ICD-10-CM

## 2018-02-24 LAB — POCT RAPID STREP A (OFFICE): Rapid Strep A Screen: NEGATIVE

## 2018-02-24 MED ORDER — AMOXICILLIN 875 MG PO TABS: 875.0000 mg | ORAL_TABLET | Freq: Two times a day (BID) | ORAL | 0 refills | Status: DC

## 2018-02-24 MED FILL — AMOXICILLIN 875 MG TABLET: 875 | 10 days supply | Qty: 20 | Fill #0

## 2018-02-24 NOTE — Progress Notes (Signed)
Savannah Morrow is a 62 y.o. female is here for an acute visit.  History of Present Illness:   HPI:   Patient complains of sore throat. Associated symptoms include chills, enlarged tonsils, nasal blockage and pain while swallowing. Onset of symptoms was 3 days ago, and have been gradually worsening since that time. She is drinking plenty of fluids. She has had recent close exposure to someone with proven streptococcal pharyngitis.  There are no preventive care reminders to display for this patient.   Depression screen [facility] 2/9 10/06/2017 04/07/2016 12/16/2013  Decreased Interest 0 0 0  Down, Depressed, Hopeless 0 0 0  PHQ - 2 Score 0 0 0   PMHx, SurgHx, SocialHx, FamHx, Medications, and Allergies were reviewed in the Visit Navigator and updated as appropriate.   Patient Active Problem List   Diagnosis Date Noted  . Family history of cerebrovascular accident (CVA) in sister 10/06/2017  . Hyperglycemia 04/07/2016  . Crohn's disease ([place])   . Chest pain 03/30/2014  . Morbid obesity ([place]) 03/07/2011  . Osteoarthritis of both knees 01/10/2010  . Edema 01/10/2010  . Hyperlipidemia 06/02/2007  . Essential hypertension 06/02/2007  . ALLERGIC RHINITIS 06/02/2007   Social History   Tobacco Use  . Smoking status: Never Smoker  . Smokeless tobacco: Never Used  Substance Use Topics  . Alcohol use: No  . Drug use: No   Current Medications and Allergies:   Current Outpatient Medications:  .  atenolol-chlorthalidone (TENORETIC) 50-25 MG tablet, Take 1 tablet by mouth daily., Disp: 90 tablet, Rfl: 3 .  atorvastatin (LIPITOR) 20 MG tablet, Take 1 tablet (20 mg total) by mouth daily., Disp: 90 tablet, Rfl: 3 .  bisacodyl (DULCOLAX) 5 MG EC tablet, Take 5 mg by mouth once., Disp: , Rfl:  .  fluticasone (FLONASE) 50 MCG/ACT nasal spray, Place 1 spray into both nostrils daily., Disp: 16 g, Rfl: 6 .  ibuprofen (ADVIL,MOTRIN) 200 MG tablet, Take 200 mg by mouth as needed., Disp: , Rfl:  .   loratadine (CLARITIN) 10 MG tablet, Take 10 mg by mouth daily as needed., Disp: , Rfl:  .  multivitamin (THERAGRAN) per tablet, Take 1 tablet by mouth daily.  , Disp: , Rfl:  .  Polyethylene Glycol 3350 (MIRALAX PO), Take 1 Dose by mouth once. As directed-for colonoscopy, Disp: , Rfl:  .  potassium chloride (K-DUR) 10 MEQ tablet, Take 1 tablet (10 mEq total) by mouth daily., Disp: 90 tablet, Rfl: 3 .  amoxicillin (AMOXIL) 875 MG tablet, Take 1 tablet (875 mg total) by mouth 2 (two) times daily., Disp: 20 tablet, Rfl: 0   Allergies  Allergen Reactions  . Meperidine Hcl Anaphylaxis  . Sulfamethoxazole-Trimethoprim Rash   Review of Systems   Pertinent items are noted in the HPI. Otherwise, ROS is negative.  Vitals:   Vitals:   02/24/18 1105  BP: 124/78  Pulse: (!) 56  Temp: 98.2 F (36.8 C)  TempSrc: Oral  SpO2: 98%  Weight: 223 lb 6.4 oz (101.3 kg)  Height: 5' 3.5" (1.613 m)     Body mass index is 38.95 kg/m.   Physical Exam:   Physical Exam  Constitutional: She appears well-nourished.  HENT:  Head: Normocephalic and atraumatic.  Mouth/Throat: Posterior oropharyngeal erythema present. Tonsils are 1+ on the right. Tonsillar exudate.  Eyes: Pupils are equal, round, and reactive to light. EOM are normal.  Neck: Normal range of motion. Neck supple.  Cardiovascular: Normal rate, regular rhythm, normal heart sounds and intact  distal pulses.  Pulmonary/Chest: Effort normal.  Abdominal: Soft.  Skin: Skin is warm.  Psychiatric: She has a normal mood and affect. Her behavior is normal.  Nursing note and vitals reviewed.   Assessment and Plan:   Savannah Morrow was seen today for sore throat.  Diagnoses and all orders for this visit:  Sore throat -     POCT rapid strep A  Exudative tonsillitis -     amoxicillin (AMOXIL) 875 MG tablet; Take 1 tablet (875 mg total) by mouth 2 (two) times daily.   . Reviewed expectations re: course of current medical issues. . Discussed  self-management of symptoms. . Outlined signs and symptoms indicating need for more acute intervention. . Patient verbalized understanding and all questions were answered. Savannah Morrow Health Maintenance issues including appropriate healthy diet, exercise, and smoking avoidance were discussed with patient. . See orders for this visit as documented in the electronic medical record. . Patient received an After Visit Summary.  Savannah Deutscher, DO [place] [place] 02/24/2018  Future Appointments  Date Time Provider [place]  03/09/2018  3:00 PM Gatha Mayer, MD [facility]  04/14/2018  8:15 AM Yong Channel Brayton Mars, MD [facility]

## 2018-03-09 ENCOUNTER — Ambulatory Visit (AMBULATORY_SURGERY_CENTER): Payer: 59 | Admitting: Internal Medicine

## 2018-03-09 ENCOUNTER — Other Ambulatory Visit: Payer: Self-pay

## 2018-03-09 ENCOUNTER — Encounter: Payer: Self-pay | Admitting: Internal Medicine

## 2018-03-09 VITALS — BP 135/60 | HR 65 | Temp 99.3°F | Resp 14 | Ht 64.0 in | Wt 224.0 lb

## 2018-03-09 DIAGNOSIS — Z1211 Encounter for screening for malignant neoplasm of colon: Secondary | ICD-10-CM | POA: Diagnosis present

## 2018-03-09 DIAGNOSIS — D123 Benign neoplasm of transverse colon: Secondary | ICD-10-CM

## 2018-03-09 DIAGNOSIS — D120 Benign neoplasm of cecum: Secondary | ICD-10-CM

## 2018-03-09 DIAGNOSIS — D125 Benign neoplasm of sigmoid colon: Secondary | ICD-10-CM | POA: Diagnosis not present

## 2018-03-09 DIAGNOSIS — K635 Polyp of colon: Secondary | ICD-10-CM | POA: Diagnosis not present

## 2018-03-09 DIAGNOSIS — D122 Benign neoplasm of ascending colon: Secondary | ICD-10-CM

## 2018-03-09 MED ORDER — SODIUM CHLORIDE 0.9 % IV SOLN: 500.0000 mL | Freq: Once | INTRAVENOUS | Status: DC

## 2018-03-09 NOTE — Progress Notes (Signed)
Called to room to assist during endoscopic procedure.  Patient ID and intended procedure confirmed with present staff. Received instructions for my participation in the procedure from the performing physician.  

## 2018-03-09 NOTE — Progress Notes (Signed)
No problems noted in the recovery room. maw 

## 2018-03-09 NOTE — Patient Instructions (Addendum)
I found and removed 6 small polyps that look benign.  I will let you know pathology results and when to have another routine colonoscopy by mail and/or My Chart.  I saw internal and external hemorrhoids also. If they bother you a lot you may make an appointment to have hemorrhoid banding (or at least discuss it)  I appreciate the opportunity to care for you. Gatha Mayer, MD, FACG     YOU HAD AN ENDOSCOPIC PROCEDURE TODAY AT [facility]:   Refer to the procedure report that was given to you for any specific questions about what was found during the examination.  If the procedure report does not answer your questions, please call your gastroenterologist to clarify.  If you requested that your care partner not be given the details of your procedure findings, then the procedure report has been included in a sealed envelope for you to review at your convenience later.  YOU SHOULD EXPECT: Some feelings of bloating in the abdomen. Passage of more gas than usual.  Walking can help get rid of the air that was put into your GI tract during the procedure and reduce the bloating. If you had a lower endoscopy (such as a colonoscopy or flexible sigmoidoscopy) you may notice spotting of blood in your stool or on the toilet paper. If you underwent a bowel prep for your procedure, you may not have a normal bowel movement for a few days.  Please Note:  You might notice some irritation and congestion in your nose or some drainage.  This is from the oxygen used during your procedure.  There is no need for concern and it should clear up in a day or so.  SYMPTOMS TO REPORT IMMEDIATELY:   Following lower endoscopy (colonoscopy or flexible sigmoidoscopy):  Excessive amounts of blood in the stool  Significant tenderness or worsening of abdominal pains  Swelling of the abdomen that is new, acute  Fever of 100F or higher   For urgent or emergent issues, a gastroenterologist can be  reached at any hour by calling [phone].   DIET:  We do recommend a small meal at first, but then you may proceed to your regular diet.  Drink plenty of fluids but you should avoid alcoholic beverages for 24 hours.  ACTIVITY:  You should plan to take it easy for the rest of today and you should NOT DRIVE or use heavy machinery until tomorrow (because of the sedation medicines used during the test).    FOLLOW UP: Our staff will call the number listed on your records the next business day following your procedure to check on you and address any questions or concerns that you may have regarding the information given to you following your procedure. If we do not reach you, we will leave a message.  However, if you are feeling well and you are not experiencing any problems, there is no need to return our call.  We will assume that you have returned to your regular daily activities without incident.  If any biopsies were taken you will be contacted by phone or by letter within the next 1-3 weeks.  Please call us at [phone] if you have not heard about the biopsies in 3 weeks.    SIGNATURES/CONFIDENTIALITY: You and/or your care partner have signed paperwork which will be entered into your electronic medical record.  These signatures attest to the fact that that the information above on your After Visit Summary has  been reviewed and is understood.  Full responsibility of the confidentiality of this discharge information lies with you and/or your care-partner.    Handout was given to your care partner on polyps. You may resume your current medications today. Await biopsy results. Please call if any questions or concerns.

## 2018-03-09 NOTE — Op Note (Signed)
[place] Patient Name: Savannah Morrow Procedure Date: 03/09/2018 2:40 PM MRN: [id number] Endoscopist: Gatha Mayer , MD Age: 62 Referring MD:  Date of Birth: 1956/04/10 Gender: Female Account #: [id number] Procedure:                Colonoscopy Indications:              Screening for colorectal malignant neoplasm, Last                            colonoscopy: 2009 Medicines:                Propofol per Anesthesia, Monitored Anesthesia Care Procedure:                Pre-Anesthesia Assessment:                           - Prior to the procedure, a History and Physical                            was performed, and patient medications and                            allergies were reviewed. The patient's tolerance of                            previous anesthesia was also reviewed. The risks                            and benefits of the procedure and the sedation                            options and risks were discussed with the patient.                            All questions were answered, and informed consent                            was obtained. Prior Anticoagulants: The patient has                            taken no previous anticoagulant or antiplatelet                            agents. ASA Grade Assessment: II - A patient with                            mild systemic disease. After reviewing the risks                            and benefits, the patient was deemed in                            satisfactory condition to undergo the procedure.  After obtaining informed consent, the colonoscope                            was passed under direct vision. Throughout the                            procedure, the patient's blood pressure, pulse, and                            oxygen saturations were monitored continuously. The                            Colonoscope was introduced through the anus and                            advanced to the the  cecum, identified by                            appendiceal orifice and ileocecal valve. The                            colonoscopy was performed without difficulty. The                            patient tolerated the procedure well. The quality                            of the bowel preparation was excellent. The                            ileocecal valve, appendiceal orifice, and rectum                            were photographed. The bowel preparation used was                            Miralax. Scope In: 2:56:12 PM Scope Out: 3:12:58 PM Scope Withdrawal Time: 0 hours 13 minutes 38 seconds  Total Procedure Duration: 0 hours 16 minutes 46 seconds  Findings:                 The perianal and digital rectal examinations were                            normal.                           Six sessile polyps were found in the sigmoid colon,                            transverse colon, ascending colon and cecum. The                            polyps were 1 to 6 mm in size. These polyps were  removed with a cold snare. Resection and retrieval                            were complete. Verification of patient                            identification for the specimen was done. Estimated                            blood loss was minimal.                           The exam was otherwise without abnormality on                            direct and retroflexion views. Complications:            No immediate complications. Estimated Blood Loss:     Estimated blood loss was minimal. Impression:               - Six 1 to 6 mm polyps in the sigmoid colon, in the                            transverse colon, in the ascending colon and in the                            cecum, removed with a cold snare. Resected and                            retrieved.                           - The examination was otherwise normal on direct                            and retroflexion  views. Recommendation:           - Patient has a contact number available for                            emergencies. The signs and symptoms of potential                            delayed complications were discussed with the                            patient. Return to normal activities tomorrow.                            Written discharge instructions were provided to the                            patient.                           - Resume previous diet.                           -  Continue present medications.                           - Repeat colonoscopy is recommended. The                            colonoscopy date will be determined after pathology                            results from today's exam become available for                            review.                           - She may schedule hemorrhoid banding appointment                            if desired Gatha Mayer, MD 03/09/2018 3:22:23 PM This report has been signed electronically.

## 2018-03-09 NOTE — Progress Notes (Signed)
A/ox3 pleased with MAC, report to RN 

## 2018-03-10 ENCOUNTER — Telehealth: Payer: Self-pay | Admitting: *Deleted

## 2018-03-10 ENCOUNTER — Telehealth: Payer: Self-pay

## 2018-03-10 NOTE — Telephone Encounter (Signed)
Unable to leave message for call back. Will call again this afternoon. SM

## 2018-03-10 NOTE — Telephone Encounter (Signed)
  Follow up Call-  Call back number 03/09/2018  Post procedure Call Back phone  # [phone]  Permission to leave phone message Yes  Some recent data might be hidden     Patient questions:  Do you have a fever, pain , or abdominal swelling? No. Pain Score  0 *  Have you tolerated food without any problems? Yes.    Have you been able to return to your normal activities? Yes.    Do you have any questions about your discharge instructions: Diet   No. Medications  No. Follow up visit  No.  Do you have questions or concerns about your Care? No.  Actions: * If pain score is 4 or above: No action needed, pain <4.

## 2018-03-17 ENCOUNTER — Encounter: Payer: Self-pay | Admitting: Internal Medicine

## 2018-03-17 DIAGNOSIS — Z860101 Personal history of adenomatous and serrated colon polyps: Secondary | ICD-10-CM

## 2018-03-17 DIAGNOSIS — Z86010 Personal history of colonic polyps: Secondary | ICD-10-CM | POA: Insufficient documentation

## 2018-03-17 HISTORY — DX: Personal history of colonic polyps: Z86.010

## 2018-03-17 HISTORY — DX: Personal history of adenomatous and serrated colon polyps: Z86.0101

## 2018-03-17 NOTE — Progress Notes (Signed)
Mix of hyperplastic and adenomatous polyps Recall 2024 My Chart letter

## 2018-03-24 MED FILL — FLUTICASONE PROP 50 MCG SPR: 50 | 60 days supply | Qty: 16 | Fill #5

## 2018-04-14 ENCOUNTER — Encounter: Payer: Self-pay | Admitting: Family Medicine

## 2018-04-14 ENCOUNTER — Ambulatory Visit (INDEPENDENT_AMBULATORY_CARE_PROVIDER_SITE_OTHER): Payer: 59 | Admitting: Family Medicine

## 2018-04-14 VITALS — BP 126/78 | HR 59 | Temp 99.1°F | Ht 64.0 in | Wt 220.8 lb

## 2018-04-14 DIAGNOSIS — J301 Allergic rhinitis due to pollen: Secondary | ICD-10-CM | POA: Diagnosis not present

## 2018-04-14 DIAGNOSIS — I10 Essential (primary) hypertension: Secondary | ICD-10-CM

## 2018-04-14 DIAGNOSIS — Z0000 Encounter for general adult medical examination without abnormal findings: Secondary | ICD-10-CM | POA: Diagnosis not present

## 2018-04-14 DIAGNOSIS — R739 Hyperglycemia, unspecified: Secondary | ICD-10-CM

## 2018-04-14 DIAGNOSIS — K50919 Crohn's disease, unspecified, with unspecified complications: Secondary | ICD-10-CM

## 2018-04-14 DIAGNOSIS — E785 Hyperlipidemia, unspecified: Secondary | ICD-10-CM

## 2018-04-14 DIAGNOSIS — Z90710 Acquired absence of both cervix and uterus: Secondary | ICD-10-CM | POA: Insufficient documentation

## 2018-04-14 LAB — COMPREHENSIVE METABOLIC PANEL
ALK PHOS: 77 U/L (ref 39–117)
ALT: 18 U/L (ref 0–35)
AST: 17 U/L (ref 0–37)
Albumin: 4.2 g/dL (ref 3.5–5.2)
BILIRUBIN TOTAL: 0.6 mg/dL (ref 0.2–1.2)
BUN: 16 mg/dL (ref 6–23)
CALCIUM: 9.7 mg/dL (ref 8.4–10.5)
CO2: 32 meq/L (ref 19–32)
CREATININE: 0.76 mg/dL (ref 0.40–1.20)
Chloride: 101 mEq/L (ref 96–112)
GFR: 81.4 mL/min (ref >60.00)
Glucose, Bld: 111 mg/dL — ABNORMAL HIGH (ref 70–99)
Potassium: 4.3 mEq/L (ref 3.5–5.1)
Sodium: 140 mEq/L (ref 135–145)
TOTAL PROTEIN: 7.5 g/dL (ref 6.0–8.3)

## 2018-04-14 LAB — LIPID PANEL
CHOL/HDL RATIO: 3
Cholesterol: 129 mg/dL (ref 0–200)
HDL: 38.6 mg/dL — AB (ref >39.00)
LDL Cholesterol: 72 mg/dL (ref 0–99)
NONHDL: 90.65
TRIGLYCERIDES: 92 mg/dL (ref 0.0–149.0)
VLDL: 18.4 mg/dL (ref 0.0–40.0)

## 2018-04-14 LAB — CBC
HCT: 41.7 % (ref 36.0–46.0)
HEMOGLOBIN: 13.9 g/dL (ref 12.0–15.0)
MCHC: 33.5 g/dL (ref 30.0–36.0)
MCV: 86.6 fl (ref 78.0–100.0)
PLATELETS: 272 10*3/uL (ref 150.0–400.0)
RBC: 4.81 Mil/uL (ref 3.87–5.11)
RDW: 14.1 % (ref 11.5–15.5)
WBC: 6.6 10*3/uL (ref 4.0–10.5)

## 2018-04-14 LAB — HEMOGLOBIN A1C: Hgb A1c MFr Bld: 5.7 % (ref 4.6–6.5)

## 2018-04-14 MED ORDER — POTASSIUM CHLORIDE ER 10 MEQ PO TBCR: 10.0000 meq | EXTENDED_RELEASE_TABLET | Freq: Every day | ORAL | 3 refills | Status: DC

## 2018-04-14 MED ORDER — ATENOLOL-CHLORTHALIDONE 50-25 MG PO TABS: 1.0000 | ORAL_TABLET | Freq: Every day | ORAL | 3 refills | Status: DC

## 2018-04-14 MED ORDER — ATORVASTATIN CALCIUM 20 MG PO TABS: 20.0000 mg | ORAL_TABLET | Freq: Every day | ORAL | 3 refills | Status: DC

## 2018-04-14 MED ORDER — FLUTICASONE PROPIONATE 50 MCG/ACT NA SUSP: 1.0000 | Freq: Every day | NASAL | 6 refills | Status: DC

## 2018-04-14 MED FILL — ATORVASTATIN 20 MG TABLET: 20 | 90 days supply | Qty: 90 | Fill #0

## 2018-04-14 MED FILL — POTASSIUM CL 10 MEQ TAB SA: 10 | 90 days supply | Qty: 90 | Fill #0

## 2018-04-14 MED FILL — ATENOLOL/CHLORTHAL 50/25: 50-25 | 90 days supply | Qty: 90 | Fill #0

## 2018-04-14 NOTE — Patient Instructions (Addendum)
Please stop by the lab before you go.  Please check with your pharmacy to see if they have the shingrix vaccine. If they do- please get this immunization and update [place] by phone call or mychart with dates you receive the vaccine  Incredible job with healthy lifestyle modifications!!! Keep up the great work.

## 2018-04-14 NOTE — Progress Notes (Signed)
Your CBC was normal (blood counts, infection fighting cells, platelets). Your CMET was normal (kidney, liver, and electrolytes, blood sugar) except for slightly high blood sugar- continue your efforts with weight loss. Keep up the great work. Your a1c has improved to lowest number in 5 years suggesting you have lowered your risks for developing diabetes Your cholesterol looks great

## 2018-04-14 NOTE — Progress Notes (Signed)
Phone: [phone]  Subjective:  Patient presents today for their annual physical. Chief complaint-noted.   See problem oriented charting- ROS- full  review of systems was completed and negative except for: seasonal allergies  The following were reviewed and entered/updated in epic: Past Medical History:  Diagnosis Date  . Allergy   . Arthritis    knees  . Crohn's disease ([place])    colon resection in 20. Dr. Carlean Purl unsure of diagnosis- no evidence on colonoscopy  . Heart murmur   . Hx of adenomatous colonic polyps 03/17/2018  . Hyperlipidemia   . Hypertension   . Uterine cancer ([place]) 1989   s/p hysterectomy   Patient Active Problem List   Diagnosis Date Noted  . Morbid obesity ([place]) 03/07/2011    Priority: High  . Hx of adenomatous colonic polyps 03/17/2018    Priority: Medium  . Family history of cerebrovascular accident (CVA) in sister 10/06/2017    Priority: Medium  . Hyperglycemia 04/07/2016    Priority: Medium  . Crohn's disease ([place])     Priority: Medium  . Hyperlipidemia 06/02/2007    Priority: Medium  . Essential hypertension 06/02/2007    Priority: Medium  . Chest pain 03/30/2014    Priority: Low  . Osteoarthritis of both knees 01/10/2010    Priority: Low  . Edema 01/10/2010    Priority: Low  . Allergic rhinitis 06/02/2007    Priority: Low  . S/P total hysterectomy 04/14/2018   Past Surgical History:  Procedure Laterality Date  . APPENDECTOMY    . COLON SURGERY  1978-79  . COLONOSCOPY    . OOPHORECTOMY     x1  . total hysterectomy    . WISDOM TOOTH EXTRACTION      Family History  Problem Relation Age of Onset  . Uterine cancer Mother   . Alzheimer's disease Mother   . Heart disease Father   . Hypertension Father   . Cancer Father        b cell lymphoma  . CVA Father        passed from stroke 60  . AVM Sister        8 years younger. brain. significant in hospital 6 weeks. Ltach/trach feeding tube  . Colon cancer Neg Hx   . Esophageal  cancer Neg Hx   . Stomach cancer Neg Hx     Medications- reviewed and updated Current Outpatient Medications  Medication Sig Dispense Refill  . atenolol-chlorthalidone (TENORETIC) 50-25 MG tablet Take 1 tablet by mouth daily. 90 tablet 3  . atorvastatin (LIPITOR) 20 MG tablet Take 1 tablet (20 mg total) by mouth daily. 90 tablet 3  . fluticasone (FLONASE) 50 MCG/ACT nasal spray Place 1 spray into both nostrils daily. 16 g 6  . ibuprofen (ADVIL,MOTRIN) 200 MG tablet Take 200 mg by mouth as needed.    . loratadine (CLARITIN) 10 MG tablet Take 10 mg by mouth daily as needed.    . multivitamin (THERAGRAN) per tablet Take 1 tablet by mouth daily.      . potassium chloride (K-DUR) 10 MEQ tablet Take 1 tablet (10 mEq total) by mouth daily. 90 tablet 3   No current facility-administered medications for this visit.     Allergies-reviewed and updated Allergies  Allergen Reactions  . Meperidine Hcl Anaphylaxis  . Sulfamethoxazole-Trimethoprim Rash    Social History   Social History Narrative   Married 44 years in 2017. 3 children. 7 grandchildren. 3 greatgrandchildren.       Works as  RN- retired from floor work 2010, now works with [employer]      Hobbies: working in garden, camping in RV    Objective: BP 126/78 (BP Location: Left Arm, Patient Position: Sitting, Cuff Size: Normal)   Pulse (!) 59   Temp 99.1 F (37.3 C) (Oral)   Ht 5' 4"  (1.626 m)   Wt 220 lb 12.8 oz (100.2 kg)   SpO2 99%   BMI 37.90 kg/m  Gen: NAD, resting comfortably HEENT: Mucous membranes are moist. Oropharynx normal Neck: no thyromegaly CV: RRR no murmurs rubs or gallops Lungs: CTAB no crackles, wheeze, rhonchi Abdomen: soft/nontender/nondistended/normal bowel sounds. No rebound or guarding.  Ext: no edema Skin: warm, dry Neuro: grossly normal, moves all extremities, PERRLA  Assessment/Plan:  62 y.o. female presenting for annual physical.  Health Maintenance counseling: 1. Anticipatory guidance:  Patient counseled regarding regular dental exams -q6 months with Dr. Vanessa Kick, eye exams - yearly, wearing seatbelts.  2. Risk factor reduction:  Advised patient of need for regular exercise and diet rich and fruits and vegetables to reduce risk of heart attack and stroke. Exercise- last year walking 5 days a week for 30 mins - she has kept this up. Diet-she has made significant strides and is down 25 lbs from last year! Had moved to more plant based diet at that time and focused on water only for beverage- she states weight watchers and myactive health have been big helps for her tremendous progress. She states in total down 40 lbs in last year.  Wt Readings from Last 3 Encounters:  04/14/18 220 lb 12.8 oz (100.2 kg)  03/09/18 224 lb (101.6 kg)  02/24/18 223 lb 6.4 oz (101.3 kg)  3. Immunizations/screenings/ancillary studies- discussed shingrix availability issues  Immunization History  Administered Date(s) Administered  . Influenza Split 07/21/2011, 08/06/2012  . Influenza Whole 08/24/2009  . Influenza, Seasonal, Injecte, Preservative Fre 07/18/2015  . Influenza,inj,Quad PF,6+ Mos 07/27/2013  . Influenza-Unspecified 07/27/2014, 08/01/2016, 07/27/2017  . Td 10/20/2005  . Tdap 04/07/2016  . Zoster 03/15/2014  4. Cervical cancer screening- total hysterectomy 1989 for uterine cancer. Had normal pap smears since that time- we agreed to discontinue exams last year 5. Breast cancer screening-  breast exam declined and mammogram 04/01/17- she wants to continue every 2 years 6. Colon cancer screening - hx adenomatous polyp 02/2018 with 5 year repeat (2024). Dr. Carlean Purl 7. Skin cancer screening- saw Dr. [place] Crane once and benign exam- she is not following with him. advised regular sunscreen use. Denies worrisome, changing, or new skin lesions. She avoids sun for the most part 8. Birth control/STD check- hysterectomy/monogamous  9. Osteoporosis screening at 1- will plan on this next year 10. Never  smoker  Status of chronic or acute concerns   HTN- controlled on atenolol- chlorthalidone 50-57m  HLD- on atorvastatin 259m update lipids today  Seasonal allergies- reasonable control with claritin and flonase  Hyperglycemia- update a1c with labs- has been at risk based on prior a1c- likely check yearly Lab Results  Component Value Date   HGBA1C 5.8 10/06/2017   Morbid obesity- see discussions in HM  Crohn's disease- resection of colon in 20s- follows with Dr. GeCarlean Purlnd no recent issues  OA still bothering her in knees but has been able to do some strength training from seated position and doing well. Also walking on treadmill some.   No problem-specific Assessment & Plan notes found for this encounter.  Return in about 6 months (around 10/14/2018) for follow up- or sooner if  needed.  Lab/Order associations: Preventative health care - Plan: CBC, Comprehensive metabolic panel, Lipid panel, Hemoglobin A1c  Hyperlipidemia, unspecified hyperlipidemia type - Plan: CBC, Comprehensive metabolic panel, Lipid panel  Hyperglycemia - Plan: Hemoglobin A1c  Meds ordered this encounter  Medications  . atorvastatin (LIPITOR) 20 MG tablet    Sig: Take 1 tablet (20 mg total) by mouth daily.    Dispense:  90 tablet    Refill:  3  . atenolol-chlorthalidone (TENORETIC) 50-25 MG tablet    Sig: Take 1 tablet by mouth daily.    Dispense:  90 tablet    Refill:  3  . potassium chloride (K-DUR) 10 MEQ tablet    Sig: Take 1 tablet (10 mEq total) by mouth daily.    Dispense:  90 tablet    Refill:  3  . fluticasone (FLONASE) 50 MCG/ACT nasal spray    Sig: Place 1 spray into both nostrils daily.    Dispense:  16 g    Refill:  6    Return precautions advised.  Garret Reddish, MD

## 2018-04-27 ENCOUNTER — Encounter: Payer: Self-pay | Admitting: Surgical

## 2018-04-27 ENCOUNTER — Ambulatory Visit (INDEPENDENT_AMBULATORY_CARE_PROVIDER_SITE_OTHER): Payer: 59 | Admitting: Surgical

## 2018-04-27 ENCOUNTER — Encounter: Payer: Self-pay | Admitting: Family Medicine

## 2018-04-27 DIAGNOSIS — Z23 Encounter for immunization: Secondary | ICD-10-CM

## 2018-04-27 NOTE — Progress Notes (Signed)
Patient comes in today for her first shingrix injection. Injection given in left deltoid. Patient tolerated injection well. Patient has scheduled her second injection.

## 2018-06-04 MED FILL — FLUTICASONE PROP 50 MCG SPR: 50 | 60 days supply | Qty: 16 | Fill #0

## 2018-07-14 MED FILL — ATENOLOL/CHLORTHAL 50/25: 50-25 | 90 days supply | Qty: 90 | Fill #1

## 2018-07-14 MED FILL — POTASSIUM CHLORIDE CRYS ER: 10 | 90 days supply | Qty: 90 | Fill #1

## 2018-07-14 MED FILL — ATORVASTATIN CALCIUM 20 MG: 20 | 90 days supply | Qty: 90 | Fill #1

## 2018-07-28 ENCOUNTER — Ambulatory Visit (INDEPENDENT_AMBULATORY_CARE_PROVIDER_SITE_OTHER): Payer: 59 | Admitting: *Deleted

## 2018-07-28 DIAGNOSIS — Z23 Encounter for immunization: Secondary | ICD-10-CM | POA: Diagnosis not present

## 2018-07-28 NOTE — Progress Notes (Signed)
Per orders of Dr. Jerline Pain, 2nd injection of shingrix given by Williemae Area. Patient tolerated injection well. Dr Yong Channel out of office today so visit sent to Dr Jerline Pain.

## 2018-08-09 ENCOUNTER — Encounter: Payer: Self-pay | Admitting: Family Medicine

## 2018-08-23 ENCOUNTER — Encounter: Payer: Self-pay | Admitting: Family Medicine

## 2018-10-14 MED FILL — ATENOLOL/CHLORTHAL 50/25: 50-25 | 90 days supply | Qty: 90 | Fill #2

## 2018-10-14 MED FILL — FLUTICASONE PROP 50 MCG SPR: 50 | 60 days supply | Qty: 16 | Fill #1

## 2018-10-14 MED FILL — ATORVASTATIN CALCIUM 20 MG: 20 | 90 days supply | Qty: 90 | Fill #2

## 2018-10-14 MED FILL — POTASSIUM CHLORIDE CRYS ER: 10 | 90 days supply | Qty: 90 | Fill #2

## 2018-10-15 DIAGNOSIS — H524 Presbyopia: Secondary | ICD-10-CM | POA: Diagnosis not present

## 2018-10-15 DIAGNOSIS — H25813 Combined forms of age-related cataract, bilateral: Secondary | ICD-10-CM | POA: Diagnosis not present

## 2018-10-15 DIAGNOSIS — H52223 Regular astigmatism, bilateral: Secondary | ICD-10-CM | POA: Diagnosis not present

## 2018-10-19 ENCOUNTER — Ambulatory Visit: Payer: 59 | Admitting: Family Medicine

## 2018-11-01 ENCOUNTER — Encounter: Payer: Self-pay | Admitting: Family Medicine

## 2018-11-09 ENCOUNTER — Encounter: Payer: Self-pay | Admitting: Family Medicine

## 2018-11-09 ENCOUNTER — Ambulatory Visit: Payer: 59 | Admitting: Family Medicine

## 2018-11-09 VITALS — BP 136/82 | HR 55 | Temp 97.5°F | Ht 64.0 in | Wt 223.4 lb

## 2018-11-09 DIAGNOSIS — I10 Essential (primary) hypertension: Secondary | ICD-10-CM

## 2018-11-09 DIAGNOSIS — E785 Hyperlipidemia, unspecified: Secondary | ICD-10-CM | POA: Diagnosis not present

## 2018-11-09 DIAGNOSIS — R739 Hyperglycemia, unspecified: Secondary | ICD-10-CM | POA: Diagnosis not present

## 2018-11-09 DIAGNOSIS — Z6838 Body mass index (BMI) 38.0-38.9, adult: Secondary | ICD-10-CM

## 2018-11-09 LAB — BASIC METABOLIC PANEL
BUN: 15 mg/dL (ref 6–23)
CO2: 33 mEq/L — ABNORMAL HIGH (ref 19–32)
Calcium: 9.9 mg/dL (ref 8.4–10.5)
Chloride: 96 mEq/L (ref 96–112)
Creatinine, Ser: 0.76 mg/dL (ref 0.40–1.20)
GFR: 76.44 mL/min (ref >60.00)
Glucose, Bld: 93 mg/dL (ref 70–99)
Potassium: 4.3 mEq/L (ref 3.5–5.1)
Sodium: 139 mEq/L (ref 135–145)

## 2018-11-09 LAB — LDL CHOLESTEROL, DIRECT: LDL DIRECT: 63 mg/dL

## 2018-11-09 LAB — HEMOGLOBIN A1C: Hgb A1c MFr Bld: 5.5 % (ref 4.6–6.5)

## 2018-11-09 NOTE — Addendum Note (Signed)
Addended by: Francis Dowse T on: 11/09/2018 08:54 AM   Modules accepted: Orders

## 2018-11-09 NOTE — Progress Notes (Signed)
Subjective:  Savannah Morrow is a 63 y.o. year old very pleasant female patient who presents for/with See problem oriented charting ROS-slight weight gain over the holidays.  Trace edema left ankle-not worsening. No issues with walking in regards toNo chest pain or shortness of breath reported  Past Medical History-  Patient Active Problem List   Diagnosis Date Noted  . Morbid obesity ([place]) 03/07/2011    Priority: High  . Hx of adenomatous colonic polyps 03/17/2018    Priority: Medium  . Family history of cerebrovascular accident (CVA) in sister 10/06/2017    Priority: Medium  . Hyperglycemia 04/07/2016    Priority: Medium  . Crohn's disease ([place])     Priority: Medium  . Hyperlipidemia 06/02/2007    Priority: Medium  . Essential hypertension 06/02/2007    Priority: Medium  . Chest pain 03/30/2014    Priority: Low  . Osteoarthritis of both knees 01/10/2010    Priority: Low  . Edema 01/10/2010    Priority: Low  . Allergic rhinitis 06/02/2007    Priority: Low  . S/P total hysterectomy 04/14/2018    Medications- reviewed and updated Current Outpatient Medications  Medication Sig Dispense Refill  . atenolol-chlorthalidone (TENORETIC) 50-25 MG tablet Take 1 tablet by mouth daily. 90 tablet 3  . atorvastatin (LIPITOR) 20 MG tablet Take 1 tablet (20 mg total) by mouth daily. 90 tablet 3  . fluticasone (FLONASE) 50 MCG/ACT nasal spray Place 1 spray into both nostrils daily. 16 g 6  . ibuprofen (ADVIL,MOTRIN) 200 MG tablet Take 200 mg by mouth as needed.    . loratadine (CLARITIN) 10 MG tablet Take 10 mg by mouth daily as needed.    . multivitamin (THERAGRAN) per tablet Take 1 tablet by mouth daily.      . potassium chloride (K-DUR) 10 MEQ tablet Take 1 tablet (10 mEq total) by mouth daily. 90 tablet 3   No current facility-administered medications for this visit.     Objective: BP 136/82   Pulse (!) 55   Temp (!) 97.5 F (36.4 C) (Oral)   Ht 5' 4"  (1.626 m)   Wt 223 lb 6.4 oz  (101.3 kg)   LMP  (LMP Unknown)   SpO2 99%   BMI 38.35 kg/m  Gen: NAD, resting comfortably CV: RRR no murmurs rubs or gallops Lungs: CTAB no crackles, wheeze, rhonchi Abdomen: soft/nontender/nondistended/overweight Ext: trace edema on left, minimal on right Skin: warm, dry Neuro: Speech normal, moves all extremities  Assessment/Plan:  Other notes: 1. Has had both Shingrix-first one will need to get logged- spoke with nursing team  Hypertension  s: controlled on atenolol-chlorthalidone 50-25 mg.  She also takes potassium chlorthalidone BP Readings from Last 3 Encounters:  11/09/18 136/82  04/14/18 126/78  03/09/18 135/60  A/P: Stable at blood pressure goal of <140/90. Continue current meds.  -Update BMP  Hyperlipidemia S:  controlled on atorvastatin 20 mg daily Lab Results  Component Value Date   CHOL 129 04/14/2018   HDL 38.60 (L) 04/14/2018   LDLCALC 72 04/14/2018   LDLDIRECT 65.0 10/06/2017   TRIG 92.0 04/14/2018   CHOLHDL 3 04/14/2018   A/P: Has been at goal-continue current medications as long as LDL at least under 100   Hyperglycemia Morbid obesity  s: Patient last visit reported being down 40 pounds over the last year.  Her diabetes risk is slightly decreased.  She is still walking 5 days a week for 30 minutes.  Weight watchers and my active health have been  very helpful for her.  Patient still technically with morbid obesity with BMI over 35 and hypertension and hyperlipidemia. She was up 7 lbs over holidays - has already lost 4 lbs back and is on her way to continued success.  Lab Results  Component Value Date   HGBA1C 5.7 04/14/2018   HGBA1C 5.8 10/06/2017   HGBA1C 5.8 07/08/2016   A/P: Suspect stable-since patient has maintained weight loss-we opted to check A1c today   In regards to weight- Encouraged need for healthy eating, regular exercise, weight loss.    Return in about 6 months (around 05/10/2019) for physical or welcome to medicare exam (schedule  as 40 mins).  Lab/Order associations: Hyperlipidemia, unspecified hyperlipidemia type - Plan: Basic metabolic panel, LDL cholesterol, direct  Hyperglycemia - Plan: Hemoglobin A1c  Essential hypertension - Plan: Basic metabolic panel  BMI 05.1-83.3,POIPP  Morbid obesity ([place])  Return precautions advised.  Garret Reddish, MD

## 2018-11-09 NOTE — Assessment & Plan Note (Signed)
Morbid obesity  s: Patient last visit reported being down 40 pounds over the last year.  Her diabetes risk is slightly decreased.  She is still walking 5 days a week for 30 minutes.  Weight watchers and my active health have been very helpful for her.  Patient still technically with morbid obesity with BMI over 35 and hypertension and hyperlipidemia. She was up 7 lbs over holidays - has already lost 4 lbs back and is on her way to continued success.  Lab Results  Component Value Date   HGBA1C 5.7 04/14/2018   HGBA1C 5.8 10/06/2017   HGBA1C 5.8 07/08/2016   A/P: Suspect stable-since patient has maintained weight loss-we opted to check A1c today   In regards to weight- Encouraged need for healthy eating, regular exercise, weight loss.

## 2018-11-09 NOTE — Patient Instructions (Addendum)
Glad you are doing so well! Keep up the great work with [employer].    Please stop by lab before you go   Depression screen [facility] 2/9 11/09/2018 10/06/2017 04/07/2016 12/16/2013  Decreased Interest 0 0 0 0  Down, Depressed, Hopeless 0 0 0 0  PHQ - 2 Score 0 0 0 0

## 2018-11-15 DIAGNOSIS — H43812 Vitreous degeneration, left eye: Secondary | ICD-10-CM | POA: Diagnosis not present

## 2018-11-15 DIAGNOSIS — H25813 Combined forms of age-related cataract, bilateral: Secondary | ICD-10-CM | POA: Diagnosis not present

## 2019-01-07 MED FILL — POTASSIUM CHLORIDE CRYS ER: 10 | 90 days supply | Qty: 90 | Fill #3

## 2019-01-07 MED FILL — ATORVASTATIN 20 MG TABLET: 20 | 90 days supply | Qty: 90 | Fill #3

## 2019-01-07 MED FILL — ATENOLOL/CHLORTHAL 50/25: 50-25 | 90 days supply | Qty: 90 | Fill #3

## 2019-01-07 MED FILL — FLUTICASONE PROP 50 MCG SPR: 50 | 60 days supply | Qty: 16 | Fill #2

## 2019-03-21 HISTORY — PX: BREAST BIOPSY: SHX20

## 2019-03-25 ENCOUNTER — Other Ambulatory Visit: Payer: Self-pay | Admitting: Family Medicine

## 2019-03-25 DIAGNOSIS — Z1231 Encounter for screening mammogram for malignant neoplasm of breast: Secondary | ICD-10-CM

## 2019-04-07 ENCOUNTER — Other Ambulatory Visit: Payer: Self-pay

## 2019-04-07 ENCOUNTER — Ambulatory Visit
Admission: RE | Admit: 2019-04-07 | Discharge: 2019-04-07 | Disposition: A | Discharge status: Home / Self Care | Payer: 59 | Source: Ambulatory Visit | Attending: Family Medicine | Admitting: Family Medicine

## 2019-04-07 DIAGNOSIS — Z1231 Encounter for screening mammogram for malignant neoplasm of breast: Secondary | ICD-10-CM | POA: Diagnosis not present

## 2019-04-08 ENCOUNTER — Other Ambulatory Visit: Payer: Self-pay | Admitting: Family Medicine

## 2019-04-08 DIAGNOSIS — R928 Other abnormal and inconclusive findings on diagnostic imaging of breast: Secondary | ICD-10-CM

## 2019-04-13 ENCOUNTER — Ambulatory Visit
Admission: RE | Admit: 2019-04-13 | Discharge: 2019-04-13 | Disposition: A | Discharge status: Home / Self Care | Payer: 59 | Source: Ambulatory Visit | Attending: Family Medicine | Admitting: Family Medicine

## 2019-04-13 ENCOUNTER — Other Ambulatory Visit: Payer: Self-pay | Admitting: Family Medicine

## 2019-04-13 ENCOUNTER — Other Ambulatory Visit: Payer: Self-pay

## 2019-04-13 DIAGNOSIS — R928 Other abnormal and inconclusive findings on diagnostic imaging of breast: Secondary | ICD-10-CM

## 2019-04-13 DIAGNOSIS — N6320 Unspecified lump in the left breast, unspecified quadrant: Secondary | ICD-10-CM

## 2019-04-13 DIAGNOSIS — N6324 Unspecified lump in the left breast, lower inner quadrant: Secondary | ICD-10-CM | POA: Diagnosis not present

## 2019-04-14 ENCOUNTER — Ambulatory Visit
Admission: RE | Admit: 2019-04-14 | Discharge: 2019-04-14 | Disposition: A | Discharge status: Home / Self Care | Payer: 59 | Source: Ambulatory Visit | Attending: Family Medicine | Admitting: Family Medicine

## 2019-04-14 DIAGNOSIS — N6324 Unspecified lump in the left breast, lower inner quadrant: Secondary | ICD-10-CM | POA: Diagnosis not present

## 2019-04-14 DIAGNOSIS — N6322 Unspecified lump in the left breast, upper inner quadrant: Secondary | ICD-10-CM | POA: Diagnosis not present

## 2019-04-14 DIAGNOSIS — N6320 Unspecified lump in the left breast, unspecified quadrant: Secondary | ICD-10-CM

## 2019-04-25 DIAGNOSIS — Z01818 Encounter for other preprocedural examination: Secondary | ICD-10-CM | POA: Diagnosis not present

## 2019-04-25 DIAGNOSIS — H25812 Combined forms of age-related cataract, left eye: Secondary | ICD-10-CM | POA: Diagnosis not present

## 2019-05-10 ENCOUNTER — Encounter: Payer: Self-pay | Admitting: Family Medicine

## 2019-05-10 NOTE — Progress Notes (Signed)
Phone: [phone]   Subjective:  Patient presents today for their Welcome to Medicare Exam    Preventive Screening-Counseling & Management  Vision screen: normal vision today. Has cataract surgery upcoming.   Hearing Screening   125Hz  250Hz  500Hz  1000Hz  2000Hz  3000Hz  4000Hz  6000Hz  8000Hz   Right ear:  Pass Pass Pass Pass      Left ear:  Fail Fail Fail Fail        Visual Acuity Screening   Right eye Left eye Both eyes  Without correction:     With correction: 20/30 20/30 20/25   Hearing Difficulties: -patient declines, failed left ear screening- referred to audiology (given sheet)  Advanced directives: HCPOA husband. Full code initially but would not want prolonged ventilation or artificial means if irreversible condition   Smoking Status: Never Smoker Second Hand Smoking status: No smokers in home Alcohol Intake: 0 per week  Risk Factors Regular exercise: has been doing walking, strength training and hand weights 5-6 days a week prior to covid- has slacked off some recently.  Diet:  some weight gain with covid 19- we discussed reversing trend with healthy diet and continuing exercise. She needs to try to cut down on eating out she says- did more at beginning of covid.  Wt Readings from Last 3 Encounters:  05/11/19 234 lb 12.8 oz (106.5 kg)  11/09/18 223 lb 6.4 oz (101.3 kg)  04/14/18 220 lb 12.8 oz (100.2 kg)  Fall Risk: None  Fall Risk  05/11/2019 10/06/2017 04/07/2016 12/16/2013  Falls in the past year? 0 No No No  Number falls in past yr: 0 - - -  Injury with Fall? 0 - - -  Opioid use history:  no long term opioids use STD screening not needed- monogomous  BMI monitoring- elevated BMI noted: Body mass index is 42.6 kg/m. morbid obesity status noted. Encouraged need for healthy eating, regular exercise, weight loss.    Cardiac risk factors:  advanced age (older than 73 for men, 20 for women)  Hyperlipidemia - but treated Hypertension- but treated No diabetes. And a1c  was not elevated last check.  Family History:  Father with heart disease and died from CVA at 71 Obesity also a risk factor  Depression Screen None. PHQ2 0  Depression screen [facility] 2/9 05/11/2019 11/09/2018 10/06/2017 04/07/2016 12/16/2013  Decreased Interest 0 0 0 0 0  Down, Depressed, Hopeless 0 0 0 0 0  PHQ - 2 Score 0 0 0 0 0    Activities of Daily Living Independent ADLs and IADLs   Cognitive Testing No reported trouble.  Mini cog: normal clock draw. 3/3 delayed recall. Normal test result  [facility]  List the Names of Other Physician/Practitioners you currently use: -Dr. Carlean Purl GI -Dr. Vanessa Kick dentist -Dr. Manuella Ghazi optho with [facility] -Dr. Nevada Crane dermatology  Immunization History  Administered Date(s) Administered  . Influenza Split 07/21/2011, 08/06/2012  . Influenza Whole 08/24/2009  . Influenza, Seasonal, Injecte, Preservative Fre 07/18/2015  . Influenza,inj,Quad PF,6+ Mos 07/27/2013  . Influenza-Unspecified 07/27/2014, 08/01/2016, 07/27/2017, 08/09/2018  . Td 10/20/2005  . Tdap 04/07/2016  . Zoster 03/15/2014  . Zoster Recombinat (Shingrix) 07/28/2018 and 04/27/18 which team needs to log- appears incomplete immunization form 04/2018   Required Immunizations needed today - reports having pneumovax 23 in the past. Still technically needs repeat as over 5 years  Screening tests-  1. Colon cancer screening-  03/09/2018 with 5 year repeat planned 2. Lung Cancer screening-  not a candidate 3. Skin cancer screening- has seen  Dr. Nevada Crane of dermatology in the past 4. Cervical cancer screening-  hysterectomy including cervix due to uterine cancer. Normal pap 2016- she has wanted to stop pap smears due to absence of cervix and present age of 63 5. Breast cancer screening- 04/07/19 needed follow up diagnostic mammogram and ultrasound- ended up with biopsy which was benign.  6 month follow up 6. Bone density - ordered today to get baseline  ROS- No pertinent positives  discovered in course of AWV Ros pertinent- No chest pain or shortness of breath. No headache or blurry vision.   The following were reviewed and entered/updated in epic: Past Medical History:  Diagnosis Date  . Allergy   . Arthritis    knees  . Crohn's disease ([place])    colon resection in 20. Dr. Carlean Purl unsure of diagnosis- no evidence on colonoscopy  . Heart murmur   . Hx of adenomatous colonic polyps 03/17/2018  . Hyperlipidemia   . Hypertension   . Uterine cancer ([place]) 1989   s/p hysterectomy   Patient Active Problem List   Diagnosis Date Noted  . Morbid obesity ([place]) 03/07/2011    Priority: High  . Hx of adenomatous colonic polyps 03/17/2018    Priority: Medium  . Family history of cerebrovascular accident (CVA) in sister 10/06/2017    Priority: Medium  . Hyperglycemia 04/07/2016    Priority: Medium  . Crohn's disease ([place])     Priority: Medium  . Hyperlipidemia 06/02/2007    Priority: Medium  . Essential hypertension 06/02/2007    Priority: Medium  . Chest pain 03/30/2014    Priority: Low  . Osteoarthritis of both knees 01/10/2010    Priority: Low  . Edema 01/10/2010    Priority: Low  . Allergic rhinitis 06/02/2007    Priority: Low  . S/P total hysterectomy 04/14/2018   Past Surgical History:  Procedure Laterality Date  . APPENDECTOMY    . COLON SURGERY  1978-79  . COLONOSCOPY    . OOPHORECTOMY     x1  . total hysterectomy    . WISDOM TOOTH EXTRACTION      Family History  Problem Relation Age of Onset  . Uterine cancer Mother   . Alzheimer's disease Mother        still alive 2020- [facility] [place]  . Heart disease Father   . Hypertension Father   . Cancer Father        b cell lymphoma  . CVA Father        passed from stroke 43  . AVM Sister        8 years younger. brain. significant in hospital 6 weeks. Ltach/trach feeding tube  . Breast cancer Maternal Grandmother   . Colon cancer Neg Hx   . Esophageal cancer Neg Hx   . Stomach cancer  Neg Hx     Medications- reviewed and updated Current Outpatient Medications  Medication Sig Dispense Refill  . atenolol-chlorthalidone (TENORETIC) 50-25 MG tablet Take 1 tablet by mouth daily. 90 tablet 3  . atorvastatin (LIPITOR) 20 MG tablet Take 1 tablet (20 mg total) by mouth daily. 90 tablet 3  . fluticasone (FLONASE) 50 MCG/ACT nasal spray Place 1 spray into both nostrils daily. 48 g 3  . loratadine (CLARITIN) 10 MG tablet Take 10 mg by mouth daily as needed.    . multivitamin (THERAGRAN) per tablet Take 1 tablet by mouth daily.      . potassium chloride (K-DUR) 10 MEQ tablet TAKE 1 TABLET (10 MEQ  TOTAL) BY MOUTH DAILY. 90 tablet 3   No current facility-administered medications for this visit.     Allergies-reviewed and updated Allergies  Allergen Reactions  . Meperidine Hcl Anaphylaxis  . Sulfamethoxazole-Trimethoprim Rash    Social History   Socioeconomic History  . Marital status: Married    Spouse name: Not on file  . Number of children: Not on file  . Years of education: Not on file  . Highest education level: Not on file  Occupational History  . Not on file  Social Needs  . Financial resource strain: Not on file  . Food insecurity    Worry: Not on file    Inability: Not on file  . Transportation needs    Medical: Not on file    Non-medical: Not on file  Tobacco Use  . Smoking status: Never Smoker  . Smokeless tobacco: Never Used  Substance and Sexual Activity  . Alcohol use: No  . Drug use: No  . Sexual activity: Yes  Lifestyle  . Physical activity    Days per week: Not on file    Minutes per session: Not on file  . Stress: Not on file  Relationships  . Social Herbalist on phone: Not on file    Gets together: Not on file    Attends religious service: Not on file    Active member of club or organization: Not on file    Attends meetings of clubs or organizations: Not on file    Relationship status: Not on file  Other Topics Concern  .  Not on file  Social History Narrative   Married 47 years in 2020. 3 children. 7 grandchildren. 4 greatgrandchildren.       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 Oropharynx normal. Cerumen in left canal obstructing TM (team to irrigate and retest hearing- hearing persisted despite normal ear canal after irrigation)  Neck: no thyromegaly or cervical lymphadenopathy.  CV: RRR no murmurs rubs or gallops Lungs: CTAB no crackles, wheeze, rhonchi Abdomen: soft/nontender/nondistended/normal bowel sounds. No rebound or guarding.  Ext: no edema Skin: warm, dry Neuro: grossly normal, moves all extremities, PERRLA    Assessment and Plan:   Welcome to Medicare exam completed- discussed recommended screenings and documented any personalized health advice and referrals for preventive counseling. See AVS as well which was given to patient.   Status of chronic or acute concerns   Just retired last Friday! Congratulated patient   Hypertension - Taking Atenolol-Chlorthalidone 50-25 mg daily.  - declines EKG for welcome to medicare  Crohn's Disease - unclear diagnosis but had colon resection in 20s as result. No evidence on colonoscopies- last colonoscopy 2019 and 5 year repeat due to adenomas  Hyperlipidemia - Taking Atorvastatin 20 mg daily. Update lipids today- reasonable control last year Lab Results  Component Value Date   CHOL 129 04/14/2018   HDL 38.60 (L) 04/14/2018   LDLCALC 72 04/14/2018   LDLDIRECT 63.0 11/09/2018   TRIG 92.0 04/14/2018  CHOLHDL 3 04/14/2018   Morbid Obesity -  See discussion above   Hyperglycemia - A1C 11/09/18 was 5.5%. will only get fasting cbg today  Audiology handout given for hearing loss in left ear.  Allergic rhinitis-needs refill on Flonase today and this was provided with 58-monthsupply  Recommended follow up: 6 month follow up advised. Can be physical.   Lab/Order associations: fasting   ICD-10-CM   1. Preventative health care  Z00.00 DG Bone Density    CBC    Comprehensive metabolic panel    Lipid panel    Hemoglobin A1c  2. Morbid obesity ([place]  E66.01   3. Hyperlipidemia, unspecified hyperlipidemia type  E78.5 CBC    Comprehensive metabolic panel    Lipid panel  4. Crohn's disease with complication, unspecified gastrointestinal tract location ([place]  K50.919   5. Essential hypertension  I10   6. Hyperglycemia  R73.9 Hemoglobin A1c  7. Postmenopausal  Z78.0 DG Bone Density  8. Need for pneumococcal vaccination  Z23 Pneumococcal polysaccharide vaccine 23-valent greater than or equal to 2yo subcutaneous/IM    CANCELED: Varicella-zoster vaccine IM    Meds ordered this encounter  Medications  . atenolol-chlorthalidone (TENORETIC) 50-25 MG tablet    Sig: Take 1 tablet by mouth daily.    Dispense:  90 tablet    Refill:  3  . atorvastatin (LIPITOR) 20 MG tablet    Sig: Take 1 tablet (20 mg total) by mouth daily.    Dispense:  90 tablet    Refill:  3  . fluticasone (FLONASE) 50 MCG/ACT nasal spray    Sig: Place 1 spray into both nostrils daily.    Dispense:  48 g    Refill:  3    Return precautions advised. SGarret Reddish MD

## 2019-05-10 NOTE — Patient Instructions (Addendum)
Health Maintenance Due  Topic Date Due  . DEXA SCAN - Schedule your bone density test at check out desk. You may also call directly to X-ray at [phone] to schedule an appointment that is convenient for you.  - located [address] across the street from [place] - in the basement - you do need an appointment for the bone density tests.   02/20/2019  . Pneumovax 23 today 02/20/2019     Ms. Spradlin , Thank you for taking time to come for your Medicare Wellness Visit. I appreciate your ongoing commitment to your health goals. Please review the following plan we discussed and let me know if I can assist you in the future.   These are the goals we discussed: 1. With united healthcare- you can do a physical in 6 months or so if you would like. In 1 year would be medicare wellness visit.  2. Goal 10-15 lbs weight loss by follow up since you have more time for yourself now.  3. Less processed/eating out foods. More home cooked foods/veggies. Perhaps cut down on biscuit cooking.  4. Consider audiology visit if hearing test not normal after wax removal    This is a list of the screening recommended for you and due dates:  Health Maintenance  Topic Date Due  . DEXA scan (bone density measurement)  02/20/2019  . Flu Shot  05/21/2019  . Pneumonia vaccines (2 of 2 - PCV13) 05/10/2020  . Mammogram  04/06/2021  . Colon Cancer Screening  03/10/2023  . Tetanus Vaccine  04/07/2026  .  Hepatitis C: One time screening is recommended by Center for Disease Control  (CDC) for  adults born from [birth year] through 1967.   Completed  . HIV Screening  Completed

## 2019-05-11 ENCOUNTER — Encounter: Payer: Self-pay | Admitting: Family Medicine

## 2019-05-11 ENCOUNTER — Other Ambulatory Visit: Payer: Self-pay

## 2019-05-11 ENCOUNTER — Ambulatory Visit (INDEPENDENT_AMBULATORY_CARE_PROVIDER_SITE_OTHER): Payer: 59 | Admitting: Family Medicine

## 2019-05-11 ENCOUNTER — Other Ambulatory Visit: Payer: Self-pay | Admitting: Family Medicine

## 2019-05-11 VITALS — BP 118/72 | HR 64 | Temp 98.4°F | Ht 62.25 in | Wt 234.8 lb

## 2019-05-11 DIAGNOSIS — Z23 Encounter for immunization: Secondary | ICD-10-CM | POA: Diagnosis not present

## 2019-05-11 DIAGNOSIS — I10 Essential (primary) hypertension: Secondary | ICD-10-CM

## 2019-05-11 DIAGNOSIS — R739 Hyperglycemia, unspecified: Secondary | ICD-10-CM

## 2019-05-11 DIAGNOSIS — E785 Hyperlipidemia, unspecified: Secondary | ICD-10-CM

## 2019-05-11 DIAGNOSIS — Z0000 Encounter for general adult medical examination without abnormal findings: Secondary | ICD-10-CM | POA: Diagnosis not present

## 2019-05-11 DIAGNOSIS — K50919 Crohn's disease, unspecified, with unspecified complications: Secondary | ICD-10-CM | POA: Diagnosis not present

## 2019-05-11 DIAGNOSIS — Z780 Asymptomatic menopausal state: Secondary | ICD-10-CM | POA: Diagnosis not present

## 2019-05-11 LAB — COMPREHENSIVE METABOLIC PANEL
ALT: 26 U/L (ref 0–35)
AST: 26 U/L (ref 0–37)
Albumin: 4.5 g/dL (ref 3.5–5.2)
Alkaline Phosphatase: 80 U/L (ref 39–117)
BUN: 14 mg/dL (ref 6–23)
CO2: 31 mEq/L (ref 19–32)
Calcium: 9.8 mg/dL (ref 8.4–10.5)
Chloride: 97 mEq/L (ref 96–112)
Creatinine, Ser: 0.86 mg/dL (ref 0.40–1.20)
GFR: 66.18 mL/min (ref >60.00)
Glucose, Bld: 98 mg/dL (ref 70–99)
Potassium: 4 mEq/L (ref 3.5–5.1)
Sodium: 136 mEq/L (ref 135–145)
Total Bilirubin: 1 mg/dL (ref 0.2–1.2)
Total Protein: 7.3 g/dL (ref 6.0–8.3)

## 2019-05-11 LAB — CBC
HCT: 42.8 % (ref 36.0–46.0)
Hemoglobin: 14.1 g/dL (ref 12.0–15.0)
MCHC: 33 g/dL (ref 30.0–36.0)
MCV: 88.2 fl (ref 78.0–100.0)
Platelets: 268 10*3/uL (ref 150.0–400.0)
RBC: 4.85 Mil/uL (ref 3.87–5.11)
RDW: 14.3 % (ref 11.5–15.5)
WBC: 7.6 10*3/uL (ref 4.0–10.5)

## 2019-05-11 LAB — HEMOGLOBIN A1C: Hgb A1c MFr Bld: 5.7 % (ref 4.6–6.5)

## 2019-05-11 LAB — LIPID PANEL
Cholesterol: 137 mg/dL (ref 0–200)
HDL: 44.8 mg/dL (ref >39.00)
LDL Cholesterol: 72 mg/dL (ref 0–99)
NonHDL: 92.59
Total CHOL/HDL Ratio: 3
Triglycerides: 103 mg/dL (ref 0.0–149.0)
VLDL: 20.6 mg/dL (ref 0.0–40.0)

## 2019-05-11 MED ORDER — ATORVASTATIN CALCIUM 20 MG PO TABS: 20.0000 mg | ORAL_TABLET | Freq: Every day | ORAL | 3 refills | Status: DC

## 2019-05-11 MED ORDER — ATENOLOL-CHLORTHALIDONE 50-25 MG PO TABS: 1.0000 | ORAL_TABLET | Freq: Every day | ORAL | 3 refills | Status: DC

## 2019-05-11 MED ORDER — FLUTICASONE PROPIONATE 50 MCG/ACT NA SUSP: 1.0000 | Freq: Every day | NASAL | 3 refills | Status: AC

## 2019-05-11 MED FILL — FLUTICASONE PROP 50 MCG SPR: 50 | 60 days supply | Qty: 16 | Fill #0

## 2019-05-11 MED FILL — POTASSIUM CHLORIDE CRYS ER: 10 | 90 days supply | Qty: 90 | Fill #0

## 2019-05-11 MED FILL — ATORVASTATIN 20 MG TABLET: 20 | 90 days supply | Qty: 90 | Fill #0

## 2019-05-11 MED FILL — ATENOLOL/CHLORTHAL 50/25: 50-25 | 90 days supply | Qty: 90 | Fill #0

## 2019-05-11 NOTE — Addendum Note (Signed)
Addended by: Jasper Loser on: 05/11/2019 02:06 PM   Modules accepted: Orders

## 2019-05-16 DIAGNOSIS — H25812 Combined forms of age-related cataract, left eye: Secondary | ICD-10-CM | POA: Diagnosis not present

## 2019-05-16 DIAGNOSIS — H2512 Age-related nuclear cataract, left eye: Secondary | ICD-10-CM | POA: Diagnosis not present

## 2019-05-30 DIAGNOSIS — H2511 Age-related nuclear cataract, right eye: Secondary | ICD-10-CM | POA: Diagnosis not present

## 2019-05-30 DIAGNOSIS — H25811 Combined forms of age-related cataract, right eye: Secondary | ICD-10-CM | POA: Diagnosis not present

## 2019-06-02 ENCOUNTER — Ambulatory Visit (INDEPENDENT_AMBULATORY_CARE_PROVIDER_SITE_OTHER)
Admission: RE | Admit: 2019-06-02 | Discharge: 2019-06-02 | Disposition: A | Discharge status: Home / Self Care | Payer: Medicare Other | Source: Ambulatory Visit | Attending: Family Medicine | Admitting: Family Medicine

## 2019-06-02 ENCOUNTER — Other Ambulatory Visit: Payer: Self-pay

## 2019-06-02 DIAGNOSIS — Z0000 Encounter for general adult medical examination without abnormal findings: Secondary | ICD-10-CM

## 2019-06-02 DIAGNOSIS — Z780 Asymptomatic menopausal state: Secondary | ICD-10-CM | POA: Diagnosis not present

## 2019-08-15 MED FILL — FLUTICASONE PROP 50 MCG SPR: 50 | 60 days supply | Qty: 16 | Fill #1

## 2019-08-15 MED FILL — ATENOLOL/CHLORTHAL 50/25: 50-25 | 90 days supply | Qty: 90 | Fill #1

## 2019-08-15 MED FILL — ATORVASTATIN 20 MG TABLET: 20 | 90 days supply | Qty: 90 | Fill #1

## 2019-08-15 MED FILL — POTASSIUM CHLORIDE CRYS ER: 10 | 90 days supply | Qty: 90 | Fill #1

## 2019-09-20 ENCOUNTER — Encounter: Payer: Self-pay | Admitting: Family Medicine

## 2019-11-03 ENCOUNTER — Other Ambulatory Visit: Payer: Self-pay

## 2019-11-03 ENCOUNTER — Ambulatory Visit (INDEPENDENT_AMBULATORY_CARE_PROVIDER_SITE_OTHER): Payer: Medicare Other | Admitting: Family Medicine

## 2019-11-03 ENCOUNTER — Encounter: Payer: Self-pay | Admitting: Family Medicine

## 2019-11-03 VITALS — BP 130/68 | HR 60 | Temp 97.8°F | Ht 62.25 in | Wt 234.0 lb

## 2019-11-03 DIAGNOSIS — M549 Dorsalgia, unspecified: Secondary | ICD-10-CM | POA: Diagnosis not present

## 2019-11-03 DIAGNOSIS — M62838 Other muscle spasm: Secondary | ICD-10-CM

## 2019-11-03 DIAGNOSIS — R739 Hyperglycemia, unspecified: Secondary | ICD-10-CM

## 2019-11-03 DIAGNOSIS — I10 Essential (primary) hypertension: Secondary | ICD-10-CM

## 2019-11-03 MED ORDER — CYCLOBENZAPRINE HCL 10 MG PO TABS: 5.0000 mg | ORAL_TABLET | Freq: Three times a day (TID) | ORAL | 0 refills | Status: DC | PRN

## 2019-11-03 MED ORDER — PREDNISONE 20 MG PO TABS: ORAL_TABLET | ORAL | 0 refills | Status: DC

## 2019-11-03 NOTE — Progress Notes (Signed)
Phone [phone] Virtual visit via Video note   Subjective:  Chief complaint: Chief Complaint  Patient presents with  . Neck Pain    This visit type was conducted due to national recommendations for restrictions regarding the COVID-19 Pandemic (e.g. social distancing).  This format is felt to be most appropriate for this patient at this time balancing risks to patient and risks to population by having him in for in person visit.  No physical exam was performed (except for noted visual exam or audio findings with Telehealth visits).    Our team/I connected with Bobbe Medico at  4:40 PM EST by a video enabled telemedicine application (doxy.me or caregility through epic) and verified that I am speaking with the correct person using two identifiers.  Location patient: [facility] Location provider: [facility], office Persons participating in the virtual visit:  patient  Our team/I discussed the limitations of evaluation and management by telemedicine and the availability of in person appointments. In light of current covid-19 pandemic, patient also understands that we are trying to protect them by minimizing in office contact if at all possible.  The patient expressed consent for telemedicine visit and agreed to proceed. Patient understands insurance will be billed.   Past Medical History-  Patient Active Problem List   Diagnosis Date Noted  . Morbid obesity ([place]) 03/07/2011    Priority: High  . Hx of adenomatous colonic polyps 03/17/2018    Priority: Medium  . Family history of cerebrovascular accident (CVA) in sister 10/06/2017    Priority: Medium  . Hyperglycemia 04/07/2016    Priority: Medium  . Crohn's disease ([place])     Priority: Medium  . Hyperlipidemia 06/02/2007    Priority: Medium  . Essential hypertension 06/02/2007    Priority: Medium  . Chest pain 03/30/2014    Priority: Low  . Osteoarthritis of both knees 01/10/2010    Priority: Low  . Edema 01/10/2010    Priority:  Low  . Allergic rhinitis 06/02/2007    Priority: Low  . S/P total hysterectomy 04/14/2018    Medications- reviewed and updated Current Outpatient Medications  Medication Sig Dispense Refill  . atenolol-chlorthalidone (TENORETIC) 50-25 MG tablet Take 1 tablet by mouth daily. 90 tablet 3  . atorvastatin (LIPITOR) 20 MG tablet Take 1 tablet (20 mg total) by mouth daily. 90 tablet 3  . Cholecalciferol (VITAMIN D3) 30 MCG/15ML LIQD Take by mouth.    . fluticasone (FLONASE) 50 MCG/ACT nasal spray Place 1 spray into both nostrils daily. 48 g 3  . loratadine (CLARITIN) 10 MG tablet Take 10 mg by mouth daily as needed.    . multivitamin (THERAGRAN) per tablet Take 1 tablet by mouth daily.      . potassium chloride (K-DUR) 10 MEQ tablet TAKE 1 TABLET (10 MEQ TOTAL) BY MOUTH DAILY. 90 tablet 3   No current facility-administered medications for this visit.     Objective:  BP 130/68   Pulse 60   Temp 97.8 F (36.6 C) (Oral)   Ht 5' 2.25" (1.581 m)   Wt 234 lb (106.1 kg)   LMP  (LMP Unknown)   BMI 42.46 kg/m  self reported vitals Gen: NAD, appears to be very uncomfortable almost tearful-points to right upper back as most severe point of pain.  She is able to move her arms fully.  Reports pain in upper back with extension and flexion of neck. Lungs: nonlabored, normal respiratory rate  Skin: appears dry, no obvious rash  Assessment and Plan   # Right upper back, Neck and arm pain S:Patient woke up yesterday morning with some neck pain, right upper back pain (feels tight with spasm), and right arm pain. Pain rated as severe. Arm movement not restricted and doesn't worse pain. Pain worse when laying down. Pain level is at 9/10. improved with heat (rice pack in microwave) and ibuprofen.   States pain goes down outside of arm to below elbow but does not reach the wrist. She denies any SOB, chest pain or changes in vision. Patient has tried salonpas patches,  NSAIDS (412m every 8 hrs today  only), and heat. The heat did provide only temporary mild relief. She denies any injury, fall, or history of spine issues. Pain worsens with bending forward or extending neck. No tingling into hand.   Denies falls/trauma/sleeping wrong. No chest pain or shortness of rbeath. No arm weakness or dropping objects A/P: 64year old female with right upper back pain as the central origin of her pain with associated muscle spasm.  She is also experiencing right neck, shoulder and arm pain.  No paresthesias and pain does not go down to the hand.  Not clearly a cervical radiculopathy.  May have a flareup of degenerative disc disease causing muscular spasm in right upper back -We opted to treat with Flexeril-we will start with just 1/2 tablet to see if this can relax some of the muscle tension -I also thought a lower dose course of prednisone will be reasonable.   -Try Tylenol extended release -I doubt cardiac in origin or cardiovascular-with pain being so pinpoint over area of muscle spasm I think something like aortic dissection is unlikely  # Obesity morbid/hyperglycemia -prednisone Not ideal given her obesity and hyperglycemia with elevated hemoglobin A1c in the past but she seems to really be suffering so want to be aggressive.  Thankfully weight appears to have been stable over the holidays.  We will check him next week at her visit  #hypertension S: compliant with atenolol chlorthalidone 50-25 mg BP Readings from Last 3 Encounters:  11/03/19 130/68  05/11/19 118/72  11/09/18 136/82  A/P: Blood pressure control today-I think she can tolerate the prednisone   Recommended follow up: Already scheduled for visit on the 22nd Future Appointments  Date Time Provider DArtois 11/11/2019  8:40 AM HMarin Olp MD [facility]   Lab/Order associations:   ICD-10-CM   1. Morbid obesity ([place]  E66.01   2. Hyperglycemia  R73.9   3. Essential hypertension  I10     Meds ordered this encounter   Medications  . predniSONE (DELTASONE) 20 MG tablet    Sig: Take 1 tablet by mouth daily for 5 days, then 1/2 tablet daily for 2 days    Dispense:  6 tablet    Refill:  0  . cyclobenzaprine (FLEXERIL) 10 MG tablet    Sig: Take 0.5-1 tablets (5-10 mg total) by mouth 3 (three) times daily as needed for muscle spasms.    Dispense:  20 tablet    Refill:  0   Return precautions advised.  SGarret Reddish MD

## 2019-11-03 NOTE — Patient Instructions (Signed)
There are no preventive care reminders to display for this patient.  Depression screen [facility] 2/9 05/11/2019 11/09/2018 10/06/2017  Decreased Interest 0 0 0  Down, Depressed, Hopeless 0 0 0  PHQ - 2 Score 0 0 0    Recommended follow up: No follow-ups on file.

## 2019-11-10 ENCOUNTER — Other Ambulatory Visit: Payer: Self-pay | Admitting: Family Medicine

## 2019-11-10 ENCOUNTER — Other Ambulatory Visit: Payer: Self-pay

## 2019-11-10 DIAGNOSIS — N6320 Unspecified lump in the left breast, unspecified quadrant: Secondary | ICD-10-CM

## 2019-11-11 ENCOUNTER — Ambulatory Visit (INDEPENDENT_AMBULATORY_CARE_PROVIDER_SITE_OTHER): Payer: Medicare Other | Admitting: Family Medicine

## 2019-11-11 ENCOUNTER — Encounter: Payer: Self-pay | Admitting: Family Medicine

## 2019-11-11 VITALS — BP 130/68 | HR 76 | Temp 98.5°F | Ht 62.25 in | Wt 237.4 lb

## 2019-11-11 DIAGNOSIS — R739 Hyperglycemia, unspecified: Secondary | ICD-10-CM

## 2019-11-11 DIAGNOSIS — M549 Dorsalgia, unspecified: Secondary | ICD-10-CM

## 2019-11-11 DIAGNOSIS — E785 Hyperlipidemia, unspecified: Secondary | ICD-10-CM | POA: Diagnosis not present

## 2019-11-11 DIAGNOSIS — I10 Essential (primary) hypertension: Secondary | ICD-10-CM | POA: Diagnosis not present

## 2019-11-11 DIAGNOSIS — H9190 Unspecified hearing loss, unspecified ear: Secondary | ICD-10-CM | POA: Insufficient documentation

## 2019-11-11 NOTE — Progress Notes (Signed)
Phone [phone] In person visit   Subjective:   Savannah Morrow is a 64 y.o. year old very pleasant female patient who presents for/with See problem oriented charting Chief Complaint  Patient presents with  . Hyperlipidemia  . Hypertension   This visit occurred during the SARS-CoV-2 public health emergency.  Safety protocols were in place, including screening questions prior to the visit, additional usage of staff PPE, and extensive cleaning of exam room while observing appropriate contact time as indicated for disinfecting solutions.   Past Medical History-  Patient Active Problem List   Diagnosis Date Noted  . Morbid obesity ([place]) 03/07/2011    Priority: High  . Hx of adenomatous colonic polyps 03/17/2018    Priority: Medium  . Family history of cerebrovascular accident (CVA) in sister 10/06/2017    Priority: Medium  . Hyperglycemia 04/07/2016    Priority: Medium  . Crohn's disease ([place])     Priority: Medium  . Hyperlipidemia 06/02/2007    Priority: Medium  . Essential hypertension 06/02/2007    Priority: Medium  . Chest pain 03/30/2014    Priority: Low  . Osteoarthritis of both knees 01/10/2010    Priority: Low  . Edema 01/10/2010    Priority: Low  . Allergic rhinitis 06/02/2007    Priority: Low  . S/P total hysterectomy 04/14/2018    Medications- reviewed and updated Current Outpatient Medications  Medication Sig Dispense Refill  . atenolol-chlorthalidone (TENORETIC) 50-25 MG tablet Take 1 tablet by mouth daily. 90 tablet 3  . atorvastatin (LIPITOR) 20 MG tablet Take 1 tablet (20 mg total) by mouth daily. 90 tablet 3  . cyclobenzaprine (FLEXERIL) 10 MG tablet Take 0.5-1 tablets (5-10 mg total) by mouth 3 (three) times daily as needed for muscle spasms. 20 tablet 0  . fluticasone (FLONASE) 50 MCG/ACT nasal spray Place 1 spray into both nostrils daily. 48 g 3  . loratadine (CLARITIN) 10 MG tablet Take 10 mg by mouth daily as needed.    Marland Kitchen OVER THE COUNTER  MEDICATION D3 zink and vit c    . potassium chloride (K-DUR) 10 MEQ tablet TAKE 1 TABLET (10 MEQ TOTAL) BY MOUTH DAILY. 90 tablet 3   No current facility-administered medications for this visit.     Objective:  BP 130/68   Pulse 76   Temp 98.5 F (36.9 C) (Temporal)   Ht 5' 2.25" (1.581 m)   Wt 237 lb 6.4 oz (107.7 kg)   LMP  (LMP Unknown)   SpO2 98%   BMI 43.07 kg/m  Gen: NAD, resting comfortably CV: RRR no murmurs rubs or gallops Lungs: CTAB no crackles, wheeze, rhonchi Ext: no edema Skin: warm, dry    Assessment and Plan    #Right upper back pain S:Patient seen by virtual visit about a week ago.  She was having right upper back pain radiating into the neck and shoulder area and arm and was severe up to 9 out of 10.  We thought could be muscle spasm related given pinpoint area of pain-opted trial of Flexeril.  In case there was an element of cervical radiculopathy we also treated with prednisone.  Also recommended Tylenol extended release.  Flexeril was helpful but full pill made her too tired but found prednisone most helpful after a few days- pain down to 2/10.   In reflection wonders if abnormal sitting position at home with computer A/P: sounds like may have been cervical radiculopathy due to response to prednisone/improvement. She will let us know if worsening  again- could refer to sports medicine or orthopedics.    # Essential hypertension S: currently taking Atenolol-Chlorthalidone 50-25 mg daily. She has been checking blood pressures at home 2-3 times a week. On average her readings are 130/65. Tries to avoid salt and soft drinks. Only drinks water.  BP Readings from Last 3 Encounters:  11/11/19 130/68  11/03/19 130/68  05/11/19 118/72  A/P:  Stable. Continue current medications.     # Hyperlipidemia, unspecified hyperlipidemia type S:Patient has not had atorvastatin for last week per instructions from our office.  We were holding this with acute back pain A/P:  pain from right upper back much improved- will restart atorvastatin 4m.     # Obesity morbid/hyperglycemia S: Up 3 pounds from July visit.  COVID-19 has been challenging as well the holidays- plus winter is harder for her- walking less with the cold as causes arthralgias- doing some treadmill walking. Doing some senior exercise classes online.   Has increased risk of diabetes based on prior labs- lifestyle changes really important for prevention.   Enrolled in weight watchers- still following plan. Trying to avoid processed foods and increasing veggies, blueberries. Also has tried turmeric Lab Results  Component Value Date   HGBA1C 5.7 05/11/2019   HGBA1C 5.5 11/09/2018   HGBA1C 5.7 04/14/2018  A/P: weight up slightly with winter months -Encouraged need for healthy eating, regular exercise, weight loss.    Recommended follow up: 6 month physical and bloodwork- we opted to push out bloodwork to give more time for lifestyle changes Future Appointments  Date Time Provider [place] 11/22/2019  7:40 AM [facility] DIAG [place] 1 [facility]  11/22/2019  7:50 AM [facility] [place] [facility]   Lab/Order associations:   ICD-10-CM   1. Essential hypertension  I10   2. Hyperlipidemia, unspecified hyperlipidemia type  E78.5   3. Morbid obesity ([place]  E66.01   4. Hyperglycemia  R73.9    Return precautions advised.  SGarret Reddish MD

## 2019-11-11 NOTE — Patient Instructions (Addendum)
No changes today- lets get back on the weight loss journey- you've got this!   [email or web address]  Recommended follow up: Return in 6 months (on 05/10/2020) for AWV due after 05/11/2020, physical or sooner if needed.

## 2019-11-14 MED FILL — ATENOLOL/CHLORTHAL 50/25: 50-25 | 90 days supply | Qty: 90 | Fill #2

## 2019-11-14 MED FILL — POTASSIUM CHLORIDE CRYS ER: 10 | 90 days supply | Qty: 90 | Fill #2

## 2019-11-14 MED FILL — ATORVASTATIN 20 MG TABLET: 20 | 90 days supply | Qty: 90 | Fill #2

## 2019-11-14 MED FILL — FLUTICASONE PROP 50 MCG SPR: 50 | 60 days supply | Qty: 16 | Fill #2

## 2019-11-22 ENCOUNTER — Ambulatory Visit
Admission: RE | Admit: 2019-11-22 | Discharge: 2019-11-22 | Disposition: A | Discharge status: Home / Self Care | Payer: Medicare Other | Source: Ambulatory Visit | Attending: Family Medicine | Admitting: Family Medicine

## 2019-11-22 ENCOUNTER — Other Ambulatory Visit: Payer: Self-pay

## 2019-11-22 ENCOUNTER — Ambulatory Visit: Payer: Medicare Other

## 2019-11-22 DIAGNOSIS — N6320 Unspecified lump in the left breast, unspecified quadrant: Secondary | ICD-10-CM

## 2019-11-22 DIAGNOSIS — R922 Inconclusive mammogram: Secondary | ICD-10-CM | POA: Diagnosis not present

## 2020-02-07 ENCOUNTER — Encounter: Payer: Self-pay | Admitting: Family Medicine

## 2020-02-13 MED FILL — ATENOLOL/CHLORTHAL 50/25: 50-25 | 90 days supply | Qty: 90 | Fill #3

## 2020-02-13 MED FILL — ATORVASTATIN 20 MG TABLET: 20 | 90 days supply | Qty: 90 | Fill #3

## 2020-02-13 MED FILL — FLUTICASONE PROP 50 MCG SPR: 50 | 60 days supply | Qty: 16 | Fill #3

## 2020-02-13 MED FILL — POTASSIUM CHLORIDE CRYS ER: 10 | 90 days supply | Qty: 90 | Fill #3

## 2020-04-27 ENCOUNTER — Other Ambulatory Visit: Payer: Self-pay | Admitting: Family Medicine

## 2020-04-27 DIAGNOSIS — Z1231 Encounter for screening mammogram for malignant neoplasm of breast: Secondary | ICD-10-CM

## 2020-05-07 ENCOUNTER — Encounter: Payer: Self-pay | Admitting: Family Medicine

## 2020-05-08 ENCOUNTER — Other Ambulatory Visit: Payer: Self-pay

## 2020-05-08 ENCOUNTER — Other Ambulatory Visit: Payer: Self-pay | Admitting: Family Medicine

## 2020-05-08 DIAGNOSIS — N644 Mastodynia: Secondary | ICD-10-CM

## 2020-05-08 MED ORDER — ATENOLOL-CHLORTHALIDONE 50-25 MG PO TABS: 1.0000 | ORAL_TABLET | Freq: Every day | ORAL | 3 refills | Status: DC

## 2020-05-08 MED ORDER — POTASSIUM CHLORIDE CRYS ER 10 MEQ PO TBCR: EXTENDED_RELEASE_TABLET | ORAL | 3 refills | Status: DC

## 2020-05-08 MED ORDER — ATORVASTATIN CALCIUM 20 MG PO TABS: 20.0000 mg | ORAL_TABLET | Freq: Every day | ORAL | 3 refills | Status: DC

## 2020-05-08 MED FILL — POTASSIUM CHLORIDE CRYS ER: 10 | 90 days supply | Qty: 90 | Fill #0

## 2020-05-08 MED FILL — ATENOLOL/CHLORTHAL 50/25: 50-25 | 90 days supply | Qty: 90 | Fill #0

## 2020-05-08 MED FILL — ATORVASTATIN 20 MG TABLET: 20 | 90 days supply | Qty: 90 | Fill #0

## 2020-05-09 DIAGNOSIS — H905 Unspecified sensorineural hearing loss: Secondary | ICD-10-CM | POA: Diagnosis not present

## 2020-05-17 ENCOUNTER — Ambulatory Visit (INDEPENDENT_AMBULATORY_CARE_PROVIDER_SITE_OTHER): Payer: Medicare Other

## 2020-05-17 DIAGNOSIS — Z0000 Encounter for general adult medical examination without abnormal findings: Secondary | ICD-10-CM

## 2020-05-17 NOTE — Progress Notes (Signed)
Virtual Visit via Telephone Note  I connected with  Savannah Morrow on 05/17/20 at  1:45 PM EDT by telephone and verified that I am speaking with the correct person using two identifiers.  Medicare Annual Wellness visit completed telephonically due to Covid-19 pandemic.   Persons participating in this call: This Health Coach and this patient.   Location: Patient: Home Provider: Office   I discussed the limitations, risks, security and privacy concerns of performing an evaluation and management service by telephone and the availability of in person appointments. The patient expressed understanding and agreed to proceed.  Unable to perform video visit due to video visit attempted and failed and/or patient does not have video capability.   Some vital signs may be absent or patient reported.   Willette Brace, LPN    Subjective:   Savannah Morrow is a 64 y.o. female who presents for an Initial Medicare Annual Wellness Visit.  Review of Systems     Cardiac Risk Factors include: advanced age (>75mn, >>2women);obesity (BMI >30kg/m2);hypertension;dyslipidemia     Objective:    There were no vitals filed for this visit. There is no height or weight on file to calculate BMI.  Advanced Directives 05/17/2020  Does Patient Have a Medical Advance Directive? Yes  Type of [place]  Copy of HRivierain Chart? No - copy requested    Current Medications (verified) Outpatient Encounter Medications as of 05/17/2020  Medication Sig  . atenolol-chlorthalidone (TENORETIC) 50-25 MG tablet Take 1 tablet by mouth daily.  .Marland Kitchenatorvastatin (LIPITOR) 20 MG tablet Take 1 tablet (20 mg total) by mouth daily.  . Cholecalciferol (VITAMIN D3) 50 MCG (2000 UT) capsule Take by mouth daily.  . fluticasone (FLONASE) 50 MCG/ACT nasal spray Place 1 spray into both nostrils daily.  .Marland Kitchenloratadine (CLARITIN) 10 MG tablet Take 10 mg by mouth daily as  needed.  . potassium chloride (KLOR-CON) 10 MEQ tablet TAKE 1 TABLET (10 MEQ TOTAL) BY MOUTH DAILY.  . [DISCONTINUED] cyclobenzaprine (FLEXERIL) 10 MG tablet Take 0.5-1 tablets (5-10 mg total) by mouth 3 (three) times daily as needed for muscle spasms.  . [DISCONTINUED] OVER THE COUNTER MEDICATION D3 zink and vit c   No facility-administered encounter medications on file as of 05/17/2020.    Allergies (verified) Meperidine hcl and Sulfamethoxazole-trimethoprim   History: Past Medical History:  Diagnosis Date  . Allergy   . Arthritis    knees  . Crohn's disease ([place]    colon resection in 20. Dr. GCarlean Purlunsure of diagnosis- no evidence on colonoscopy  . Heart murmur   . Hx of adenomatous colonic polyps 03/17/2018  . Hyperlipidemia   . Hypertension   . [place]   s/p hysterectomy   Past Surgical History:  Procedure Laterality Date  . APPENDECTOMY    . COLON SURGERY  1978-79  . COLONOSCOPY    . OOPHORECTOMY     x1  . total hysterectomy    . WISDOM TOOTH EXTRACTION     Family History  Problem Relation Age of Onset  . Uterine cancer Mother   . Alzheimer's disease Mother        still alive 2020- [facility] [place]  . Heart disease Father   . Hypertension Father   . Cancer Father        b cell lymphoma  . CVA Father        passed from stroke 7110 .  AVM Sister        8 years younger. brain. significant in hospital 6 weeks. Ltach/trach feeding tube  . Breast cancer Maternal Grandmother   . Colon cancer Neg Hx   . Esophageal cancer Neg Hx   . Stomach cancer Neg Hx    Social History   Socioeconomic History  . Marital status: Married    Spouse name: Not on file  . Number of children: Not on file  . Years of education: Not on file  . Highest education level: Not on file  Occupational History  . Occupation: retired  Tobacco Use  . Smoking status: Never Smoker  . Smokeless tobacco: Never Used  Vaping Use  . Vaping Use: Never used  Substance and  Sexual Activity  . Alcohol use: No  . Drug use: No  . Sexual activity: Yes  Other Topics Concern  . Not on file  Social History Narrative   Married 47 years in 2020. 3 children. 7 grandchildren. 4 greatgrandchildren.       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 Difficulty of Paying Living Expenses: Not hard at all  Food Insecurity: No Food Insecurity  . Worried About Charity fundraiser in the Last Year: Never true  . Ran Out of Food in the Last Year: Never true  Transportation Needs: No Transportation Needs  . Lack of Transportation (Medical): No  . Lack of Transportation (Non-Medical): No  Physical Activity: Sufficiently Active  . Days of Exercise per Week: 6 days  . Minutes of Exercise per Session: 30 min  Stress: No Stress Concern Present  . Feeling of Stress : Not at all  Social Connections: Moderately Integrated  . Frequency of Communication with Friends and Family: More than three times a week  . Frequency of Social Gatherings with Friends and Family: More than three times a week  . Attends Religious Services: More than 4 times per year  . Active Member of Clubs or Organizations: No  . Attends Archivist Meetings: Never  . Marital Status: Married    Tobacco Counseling Counseling given: Not Answered   Clinical Intake:  Pre-visit preparation completed: Yes  Pain : No/denies pain (soreness in left breast)     BMI - recorded: 43.08 Nutritional Status: BMI > 30  Obese Nutritional Risks: None Diabetes: No  How often do you need to have someone help you when you read instructions, pamphlets, or other written materials from your doctor or pharmacy?: 1 - Never  Diabetic?No  Interpreter Needed?: No  Information entered by :: Charlott Rakes, LPN   Activities of Daily Living In your present state of  health, do you have any difficulty performing the following activities: 05/17/2020 11/03/2019  Hearing? Y N  Comment hearing aid left ear -  Vision? N N  Difficulty concentrating or making decisions? N N  Walking or climbing stairs? N N  Dressing or bathing? N N  Doing errands, shopping? N N  Preparing Food and eating ? N -  Using the Toilet? N -  In the past six months, have you accidently leaked urine? Y -  Comment wear light pad on daily bases -  Do you have problems with loss of bowel control? N -  Managing your Medications? N -  Managing your Finances? N -  Housekeeping or managing your Housekeeping? N -  Some recent data might be hidden    Patient Care Team: Marin Olp, MD as PCP - General (Family Medicine)  Indicate any recent Medical Services you may have received from other than [facility] providers in the past year (date may be approximate).     Assessment:   This is a routine wellness examination for Savannah Morrow.  Hearing/Vision screen  Hearing Screening   125Hz  250Hz  500Hz  1000Hz  2000Hz  3000Hz  4000Hz  6000Hz  8000Hz   Right ear:           Left ear:           Comments: Left ear has trouble hearing  and after follow up with audiologist will start wearing a hearing for left ear.  Vision Screening Comments: Had cataract surgery and wears progressive lens also follows up with Dr Dorcas Carrow  Dietary issues and exercise activities discussed: Current Exercise Habits: Structured exercise class, Type of exercise: walking;Other - see comments Engineer, materials), Time (Minutes): 45, Frequency (Times/Week): 3, Weekly Exercise (Minutes/Week): 135, Intensity: Moderate  Goals    . Patient Stated     Move out of pre diabetes range and lose weight      Depression Screen PHQ 2/9 Scores 05/17/2020 05/11/2019 11/09/2018 10/06/2017 04/07/2016 12/16/2013  PHQ - 2 Score 0 0 0 0 0 0    Fall Risk Fall Risk  05/17/2020 05/11/2019 10/06/2017 04/07/2016 12/16/2013  Falls in the past year? 0 0 No No No    Number falls in past yr: 0 0 - - -  Injury with Fall? 0 0 - - -  Risk for fall due to : Impaired vision - - - -  Follow up Falls prevention discussed - - - -    Any stairs in or around the home? Yes  If so, are there any without handrails? No  Home free of loose throw rugs in walkways, pet beds, electrical cords, etc? Yes  Adequate lighting in your home to reduce risk of falls? Yes   ASSISTIVE DEVICES UTILIZED TO PREVENT FALLS:  Life alert? No  Use of a cane, walker or w/c? No  Grab bars in the bathroom? Yes  Shower chair or bench in shower? Yes  Elevated toilet seat or a handicapped toilet? No   TIMED UP AND GO:  Was the test performed? No .   Cognitive Function:     6CIT Screen 05/17/2020  What Year? 0 points  What month? 0 points  What time? 0 points  Count back from 20 0 points  Months in reverse 0 points  Repeat phrase 2 points  Total Score 2    Immunizations Immunization History  Administered Date(s) Administered  . Fluad Quad(high Dose 65+) 07/25/2019  . Influenza Split 07/21/2011, 08/06/2012  . Influenza Whole 08/24/2009  . Influenza, Seasonal, Injecte, Preservative Fre 07/18/2015  . Influenza,inj,Quad PF,6+ Mos 07/27/2013  . Influenza-Unspecified 07/27/2014, 08/01/2016, 07/27/2017, 08/09/2018, 07/25/2019  . Moderna SARS-COVID-2 Vaccination 01/10/2020, 02/07/2020  . Pneumococcal Polysaccharide-23 05/11/2019  . Td 10/20/2005  . Tdap 04/07/2016  . Zoster 03/15/2014  . Zoster Recombinat (Shingrix) 04/27/2018, 07/28/2018    TDAP status: Up to date Flu Vaccine status: Up to date Pneumococcal vaccine status: Up to date Covid-19 vaccine status: Completed vaccines  Qualifies for Shingles Vaccine? Yes   Zostavax completed Yes   Shingrix Completed?: Yes  Screening Tests Health Maintenance  Topic Date Due  . PNA vac Low Risk Adult (2 of 2 - PCV13) 05/10/2020  . INFLUENZA VACCINE  05/20/2020  . MAMMOGRAM  04/06/2021  . COLONOSCOPY  03/10/2023  .  TETANUS/TDAP  04/07/2026  . DEXA SCAN  Completed  . COVID-19 Vaccine  Completed  . Hepatitis C Screening  Completed    Health Maintenance  Health Maintenance Due  Topic Date Due  . PNA vac Low Risk Adult (2 of 2 - PCV13) 05/10/2020    Colorectal cancer screening: Completed 03/09/18. Repeat every 5 years Mammogram status: Completed 11/22/19. Repeat every year Bone Density status: Completed 06/02/19. Results reflect: Bone density results: OSTEOPENIA. Repeat every 2 years.    Additional Screening:  Hepatitis C Screening: Completed 04/07/16  Vision Screening: Recommended annual ophthalmology exams for early detection of glaucoma and other disorders of the eye. Is the patient up to date with their annual eye exam?  Yes  Who is the provider or what is the name of the office in which the patient attends annual eye exams? Dr Dorcas Carrow   Dental Screening: Recommended annual dental exams for proper oral hygiene  Community Resource Referral / Chronic Care Management: CRR required this visit?  No   CCM required this visit?  No      Plan:     I have personally reviewed and noted the following in the patient's chart:   . Medical and social history . Use of alcohol, tobacco or illicit drugs  . Current medications and supplements . Functional ability and status . Nutritional status . Physical activity . Advanced directives . List of other physicians . Hospitalizations, surgeries, and ER visits in previous 12 months . Vitals . Screenings to include cognitive, depression, and falls . Referrals and appointments  In addition, I have reviewed and discussed with patient certain preventive protocols, quality metrics, and best practice recommendations. A written personalized care plan for preventive services as well as general preventive health recommendations were provided to patient.     Willette Brace, LPN   06/[birth year]   Nurse Notes: None

## 2020-05-17 NOTE — Patient Instructions (Addendum)
[name] , Thank you for taking time to come for your Medicare Wellness Visit. I appreciate your ongoing commitment to your health goals. Please review the following plan we discussed and let me know if I can assist you in the future.   Screening recommendations/referrals: Colonoscopy: Done 03/09/18 Mammogram: Scheduled 05/29/20 Bone Density: Done 06/02/19 Recommended yearly ophthalmology/optometry visit for glaucoma screening and checkup Recommended yearly dental visit for hygiene and checkup  Vaccinations: Influenza vaccine: Up to date Pneumococcal vaccine: Prevnar 13 due in season Tdap vaccine: Up to date Shingles vaccine: Completed 7/9 & 07/28/18   Covid-19:Completed 3/23 &^ 02/07/20  Advanced directives: Bring Copy   Conditions/risks identified: Move out of pre diabetic range and lose weight  Next appointment: Follow up in one year for your annual wellness visit    Preventive Care 66 Years and Older, Female Preventive care refers to lifestyle choices and visits with your health care provider that can promote health and wellness. What does preventive care include?  A yearly physical exam. This is also called an annual well check.  Dental exams once or twice a year.  Routine eye exams. Ask your health care provider how often you should have your eyes checked.  Personal lifestyle choices, including:  Daily care of your teeth and gums.  Regular physical activity.  Eating a healthy diet.  Avoiding tobacco and drug use.  Limiting alcohol use.  Practicing safe sex.  Taking low-dose aspirin every day.  Taking vitamin and mineral supplements as recommended by your health care provider. What happens during an annual well check? The services and screenings done by your health care provider during your annual well check will depend on your age, overall health, lifestyle risk factors, and family history of disease. Counseling  Your health care provider may ask you questions  about your:  Alcohol use.  Tobacco use.  Drug use.  Emotional well-being.  Home and relationship well-being.  Sexual activity.  Eating habits.  History of falls.  Memory and ability to understand (cognition).  Work and work Statistician.  Reproductive health. Screening  You may have the following tests or measurements:  Height, weight, and BMI.  Blood pressure.  Lipid and cholesterol levels. These may be checked every 5 years, or more frequently if you are over 53 years old.  Skin check.  Lung cancer screening. You may have this screening every year starting at age 49 if you have a 30-pack-year history of smoking and currently smoke or have quit within the past 15 years.  Fecal occult blood test (FOBT) of the stool. You may have this test every year starting at age 16.  Flexible sigmoidoscopy or colonoscopy. You may have a sigmoidoscopy every 5 years or a colonoscopy every 10 years starting at age 48.  Hepatitis C blood test.  Hepatitis B blood test.  Sexually transmitted disease (STD) testing.  Diabetes screening. This is done by checking your blood sugar (glucose) after you have not eaten for a while (fasting). You may have this done every 1-3 years.  Bone density scan. This is done to screen for osteoporosis. You may have this done starting at age 90.  Mammogram. This may be done every 1-2 years. Talk to your health care provider about how often you should have regular mammograms. Talk with your health care provider about your test results, treatment options, and if necessary, the need for more tests. Vaccines  Your health care provider may recommend certain vaccines, such as:  Influenza vaccine. This is  recommended every year.  Tetanus, diphtheria, and acellular pertussis (Tdap, Td) vaccine. You may need a Td booster every 10 years.  Zoster vaccine. You may need this after age 52.  Pneumococcal 13-valent conjugate (PCV13) vaccine. One dose is  recommended after age 40.  Pneumococcal polysaccharide (PPSV23) vaccine. One dose is recommended after age 49. Talk to your health care provider about which screenings and vaccines you need and how often you need them. This information is not intended to replace advice given to you by your health care provider. Make sure you discuss any questions you have with your health care provider. Document Released: 11/02/2015 Document Revised: 06/25/2016 Document Reviewed: 08/07/2015 Elsevier Interactive Patient Education  2017 [place] Prevention in the Home Falls can cause injuries. They can happen to people of all ages. There are many things you can do to make your home safe and to help prevent falls. What can I do on the outside of my home?  Regularly fix the edges of walkways and driveways and fix any cracks.  Remove anything that might make you trip as you walk through a door, such as a raised step or threshold.  Trim any bushes or trees on the path to your home.  Use bright outdoor lighting.  Clear any walking paths of anything that might make someone trip, such as rocks or tools.  Regularly check to see if handrails are loose or broken. Make sure that both sides of any steps have handrails.  Any raised decks and porches should have guardrails on the edges.  Have any leaves, snow, or ice cleared regularly.  Use sand or salt on walking paths during winter.  Clean up any spills in your garage right away. This includes oil or grease spills. What can I do in the bathroom?  Use night lights.  Install grab bars by the toilet and in the tub and shower. Do not use towel bars as grab bars.  Use non-skid mats or decals in the tub or shower.  If you need to sit down in the shower, use a plastic, non-slip stool.  Keep the floor dry. Clean up any water that spills on the floor as soon as it happens.  Remove soap buildup in the tub or shower regularly.  Attach bath mats  securely with double-sided non-slip rug tape.  Do not have throw rugs and other things on the floor that can make you trip. What can I do in the bedroom?  Use night lights.  Make sure that you have a light by your bed that is easy to reach.  Do not use any sheets or blankets that are too big for your bed. They should not hang down onto the floor.  Have a firm chair that has side arms. You can use this for support while you get dressed.  Do not have throw rugs and other things on the floor that can make you trip. What can I do in the kitchen?  Clean up any spills right away.  Avoid walking on wet floors.  Keep items that you use a lot in easy-to-reach places.  If you need to reach something above you, use a strong step stool that has a grab bar.  Keep electrical cords out of the way.  Do not use floor polish or wax that makes floors slippery. If you must use wax, use non-skid floor wax.  Do not have throw rugs and other things on the floor that can make you trip.  What can I do with my stairs?  Do not leave any items on the stairs.  Make sure that there are handrails on both sides of the stairs and use them. Fix handrails that are broken or loose. Make sure that handrails are as long as the stairways.  Check any carpeting to make sure that it is firmly attached to the stairs. Fix any carpet that is loose or worn.  Avoid having throw rugs at the top or bottom of the stairs. If you do have throw rugs, attach them to the floor with carpet tape.  Make sure that you have a light switch at the top of the stairs and the bottom of the stairs. If you do not have them, ask someone to add them for you. What else can I do to help prevent falls?  Wear shoes that:  Do not have high heels.  Have rubber bottoms.  Are comfortable and fit you well.  Are closed at the toe. Do not wear sandals.  If you use a stepladder:  Make sure that it is fully opened. Do not climb a closed  stepladder.  Make sure that both sides of the stepladder are locked into place.  Ask someone to hold it for you, if possible.  Clearly mark and make sure that you can see:  Any grab bars or handrails.  First and last steps.  Where the edge of each step is.  Use tools that help you move around (mobility aids) if they are needed. These include:  Canes.  Walkers.  Scooters.  Crutches.  Turn on the lights when you go into a dark area. Replace any light bulbs as soon as they burn out.  Set up your furniture so you have a clear path. Avoid moving your furniture around.  If any of your floors are uneven, fix them.  If there are any pets around you, be aware of where they are.  Review your medicines with your doctor. Some medicines can make you feel dizzy. This can increase your chance of falling. Ask your doctor what other things that you can do to help prevent falls. This information is not intended to replace advice given to you by your health care provider. Make sure you discuss any questions you have with your health care provider. Document Released: 08/02/2009 Document Revised: 03/13/2016 Document Reviewed: 11/10/2014 Elsevier Interactive Patient Education  2017 [employer].

## 2020-05-21 ENCOUNTER — Ambulatory Visit (INDEPENDENT_AMBULATORY_CARE_PROVIDER_SITE_OTHER): Payer: Medicare Other | Admitting: Family Medicine

## 2020-05-21 ENCOUNTER — Other Ambulatory Visit: Payer: Self-pay

## 2020-05-21 ENCOUNTER — Encounter: Payer: Self-pay | Admitting: Family Medicine

## 2020-05-21 VITALS — BP 132/64 | HR 71 | Temp 98.1°F | Ht 62.5 in | Wt 229.0 lb

## 2020-05-21 DIAGNOSIS — I10 Essential (primary) hypertension: Secondary | ICD-10-CM | POA: Diagnosis not present

## 2020-05-21 DIAGNOSIS — Z0000 Encounter for general adult medical examination without abnormal findings: Secondary | ICD-10-CM

## 2020-05-21 DIAGNOSIS — M8580 Other specified disorders of bone density and structure, unspecified site: Secondary | ICD-10-CM | POA: Insufficient documentation

## 2020-05-21 DIAGNOSIS — H9190 Unspecified hearing loss, unspecified ear: Secondary | ICD-10-CM

## 2020-05-21 DIAGNOSIS — K50919 Crohn's disease, unspecified, with unspecified complications: Secondary | ICD-10-CM

## 2020-05-21 DIAGNOSIS — Z90710 Acquired absence of both cervix and uterus: Secondary | ICD-10-CM

## 2020-05-21 DIAGNOSIS — R739 Hyperglycemia, unspecified: Secondary | ICD-10-CM

## 2020-05-21 DIAGNOSIS — M85851 Other specified disorders of bone density and structure, right thigh: Secondary | ICD-10-CM

## 2020-05-21 DIAGNOSIS — Z23 Encounter for immunization: Secondary | ICD-10-CM

## 2020-05-21 DIAGNOSIS — E785 Hyperlipidemia, unspecified: Secondary | ICD-10-CM

## 2020-05-21 NOTE — Addendum Note (Signed)
Addended by: Francella Solian on: 05/21/2020 08:52 AM   Modules accepted: Orders

## 2020-05-21 NOTE — Patient Instructions (Addendum)
Health Maintenance Due  Topic Date Due   PNA vac Low Risk Adult (2 of 2 - PCV13) will get today  05/10/2020   Please stop by lab before you go If you have mychart- we will send your results within 3 business days of [place] receiving them.  If you do not have mychart- we will call you about results within 5 business days of [place] receiving them.    Recommended follow up: Return in about 6 months (around 11/21/2020) for follow up- or sooner if needed.

## 2020-05-21 NOTE — Addendum Note (Signed)
Addended by: Liliane Channel on: 05/21/2020 08:47 AM   Modules accepted: Orders

## 2020-05-21 NOTE — Progress Notes (Signed)
Phone [phone]   Subjective:  Patient presents today for their annual physical. Chief complaint-noted.   See problem oriented charting- Review of Systems  Constitutional: Negative for chills and fever.  HENT: Positive for hearing loss.        Will get hearing aid in left ear in few weeks.  Eyes: Negative for blurred vision, double vision and photophobia.  Respiratory: Negative for cough, shortness of breath and wheezing.   Cardiovascular: Negative for chest pain, palpitations and leg swelling.  Gastrointestinal: Negative for heartburn, nausea and vomiting.  Genitourinary: Negative for dysuria, frequency and urgency.  Musculoskeletal: Negative for back pain, joint pain and neck pain.  Skin: Negative for rash.  Neurological: Negative for dizziness, seizures, weakness and headaches.  Endo/Heme/Allergies: Does not bruise/bleed easily.  Psychiatric/Behavioral: Negative for depression, memory loss and suicidal ideas. The patient does not have insomnia.     The following were reviewed and entered/updated in epic: Past Medical History:  Diagnosis Date  . Allergy   . Arthritis    knees  . Crohn's disease ([place])    colon resection in 20. Dr. Carlean Purl unsure of diagnosis- no evidence on colonoscopy  . Heart murmur   . Hx of adenomatous colonic polyps 03/17/2018  . Hyperlipidemia   . Hypertension   . Uterine cancer ([place]) 1989   s/p hysterectomy   Patient Active Problem List   Diagnosis Date Noted  . Morbid obesity ([place]) 03/07/2011    Priority: High  . Hx of adenomatous colonic polyps 03/17/2018    Priority: Medium  . Family history of cerebrovascular accident (CVA) in sister 10/06/2017    Priority: Medium  . Hyperglycemia 04/07/2016    Priority: Medium  . Crohn's disease ([place])     Priority: Medium  . Hyperlipidemia 06/02/2007    Priority: Medium  . Essential hypertension 06/02/2007    Priority: Medium  . Chest pain 03/30/2014    Priority: Low  . Osteoarthritis of both  knees 01/10/2010    Priority: Low  . Edema 01/10/2010    Priority: Low  . Allergic rhinitis 06/02/2007    Priority: Low  . Osteopenia 05/21/2020  . Hearing loss 11/11/2019  . S/P total hysterectomy 04/14/2018   Past Surgical History:  Procedure Laterality Date  . APPENDECTOMY    . COLON SURGERY  1978-79  . COLONOSCOPY    . OOPHORECTOMY     x1  . total hysterectomy    . WISDOM TOOTH EXTRACTION      Family History  Problem Relation Age of Onset  . Uterine cancer Mother   . Alzheimer's disease Mother        died Dec 03, 2019 . Heart disease Father   . Hypertension Father   . Cancer Father        b cell lymphoma  . CVA Father        passed from stroke 11  . AVM Sister        8 years younger. brain. significant in hospital 6 weeks. Ltach/trach feeding tube  . Breast cancer Maternal Grandmother   . Colon cancer Neg Hx   . Esophageal cancer Neg Hx   . Stomach cancer Neg Hx     Medications- reviewed and updated Current Outpatient Medications  Medication Sig Dispense Refill  . atenolol-chlorthalidone (TENORETIC) 50-25 MG tablet Take 1 tablet by mouth daily. 90 tablet 3  . atorvastatin (LIPITOR) 20 MG tablet Take 1 tablet (20 mg total) by mouth daily. 90 tablet 3  . Cholecalciferol (VITAMIN D3)  50 MCG (2000 UT) capsule Take by mouth daily.    . fluticasone (FLONASE) 50 MCG/ACT nasal spray Place 1 spray into both nostrils daily. 48 g 3  . loratadine (CLARITIN) 10 MG tablet Take 10 mg by mouth daily as needed.    . potassium chloride (KLOR-CON) 10 MEQ tablet TAKE 1 TABLET (10 MEQ TOTAL) BY MOUTH DAILY. 90 tablet 3   No current facility-administered medications for this visit.    Allergies-reviewed and updated Allergies  Allergen Reactions  . Meperidine Hcl Anaphylaxis  . Sulfamethoxazole-Trimethoprim Rash    Social History   Social History Narrative   Married 47 years in 2020. 3 children. 7 grandchildren. 4 greatgrandchildren.       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 Oropharynx normal Neck: no thyromegaly CV: RRR no murmurs rubs or gallops Lungs: CTAB no crackles, wheeze, rhonchi Abdomen: soft/nontender/nondistended/normal bowel sounds. No rebound or guarding.  Ext: trace edema under compression stockings Skin: warm, dry Neuro: grossly normal, moves all extremities, PERRLA   Assessment and Plan   64 y.o. female presenting for annual physical.  Health Maintenance counseling: 1. Anticipatory guidance: Patient counseled regarding regular dental exams q6 months, eye exams yearly,  avoiding smoking and second hand smoke , limiting alcohol to 1 beverage per day .  None at all  2. Risk factor reduction:  Advised patient of need for regular exercise and diet rich and fruits and vegetables to reduce risk of heart attack and stroke. Exercise- has fit bit has goal of 10,000 steps a day. Walks a mile daily 6 days a week. Exercise class 45 minutes three times a week. Diet-tries to follow weight watchers diet and tries to eat food that help with inflammation . She has lost 8 lbs from last visit Wt Readings from Last 3 Encounters:  05/21/20 (!) 229 lb (103.9 kg)  11/11/19 237 lb 6.4 oz (107.7 kg)  11/03/19 234 lb (106.1 kg)  3. Immunizations/screenings/ancillary studies- with obesity and crohn's we opted to do prevnar today  Immunization History  Administered Date(s) Administered  . Fluad Quad(high Dose 65+) 07/25/2019  . Influenza Split 07/21/2011, 08/06/2012  . Influenza Whole 08/24/2009  . Influenza, Seasonal, Injecte, Preservative Fre 07/18/2015  . Influenza,inj,Quad PF,6+ Mos 07/27/2013  . Influenza-Unspecified  07/27/2014, 08/01/2016, 07/27/2017, 08/09/2018, 07/25/2019  . Moderna SARS-COVID-2 Vaccination 01/10/2020, 02/07/2020  . Pneumococcal Polysaccharide-23 05/11/2019  . Td 10/20/2005  . Tdap 04/07/2016  . Zoster 03/15/2014  . Zoster Recombinat (Shingrix) 04/27/2018, 07/28/2018  4. Cervical cancer screening-  History of hysterectomy for uterine cancer- removed cervix- no history of cervical cancer. Plus passed age 70. No vaginal/GU complaints  5. Breast cancer screening-  breast exam -self exam monthly and mammogram - has appointment 05/29/20 6. Colon cancer screening -  up to date next due 03/10/2023. Adenoma in 2019 7. Skin cancer screening- Has been seen by dermatology several years ago. Advised regular sunscreen use. Denies worrisome, changing, or new skin lesions.  8. Birth control/STD check- Declines as postmenopausal and  monogamous  9. Osteoporosis screening at  65- slight osteopenia/low bone density noted last august- repeat 1-2 years from now Up to date. Currently on D3 -Never smoker  Status of chronic or acute concerns   Right upper back pain-noted as January visit and improved with prednisone-she reports no recurrent issues. Thinks it was from crocheting for 8 hours at a time.   Hypertension-controlled on atenolol chlorthalidone 50-25 mg.  Home numbers remain well controlled 130/60s typically  Hyperlipidemia-held atorvastatin 45m short-term with back pain earlier this year.  She is consistent with this now.  Update lipid panel today with LDL goal under 70. Do not strongly desire statin increase due to diabetes risk Lab Results  Component Value Date   CHOL 137 05/11/2019   HDL 44.80 05/11/2019   LDLCALC 72 05/11/2019   LDLDIRECT 63.0 11/09/2018   TRIG 103.0 05/11/2019   CHOLHDL 3 05/11/2019    # Hyperglycemia/insulin resistance/prediabetes/Morbid obesity with BMI over 40 S:  Medication: None Exercise and diet- excellent job as above with weight loss/exercise/health eating Lab  Results  Component Value Date   HGBA1C 5.7 05/11/2019   HGBA1C 5.5 11/09/2018   HGBA1C 5.7 04/14/2018   A/P: Hopefully remains in low prediabetes or even normal range-update A1c with labs    Crohn's disease-colon resection in her 242s  Dr. GCarlean Purlunsure of diagnosis.  No evidence on repeat colonoscopy.  Continue to monitor for blood in stool or abdominal cramping  Hearing loss as above-plan to get hearing aids. Scanned in audiogram  Recommended follow up: Return in about 6 months (around 11/21/2020) for follow up- or sooner if needed. Future Appointments  Date Time Provider DEden 05/29/2020  7:40 AM [facility] DIAG [place] 1 [facility] GI-BRE[facility] CE  05/29/2020  7:50 AM [facility] [place] [facility]BRE[facility] CE  05/23/2021  1:45 PM [facility] HEALTH COACH [facility] PEC   Lab/Order associations: fasting   ICD-10-CM   1. Preventative health care  Z00.00 CBC with Differential/Platelet    Comprehensive metabolic panel    Lipid panel    Hemoglobin A1c  2. Hyperlipidemia, unspecified hyperlipidemia type  E78.5 CBC with Differential/Platelet    Comprehensive metabolic panel    Lipid panel  3. Hyperglycemia  R73.9 CBC with Differential/Platelet    Hemoglobin A1c  4. Essential hypertension  I10   5. Crohn's disease with complication, unspecified gastrointestinal tract location ([place]  K50.919   6. S/P total hysterectomy  Z90.710   7. Osteopenia of neck of right femur  M85.851   8. Hearing loss, unspecified hearing loss type, unspecified laterality  H91.90     No orders of the defined types were placed in this encounter.   Return precautions advised.  SGarret Reddish MD

## 2020-05-22 LAB — COMPREHENSIVE METABOLIC PANEL
AG Ratio: 1.4 (calc) (ref 1.0–2.5)
ALT: 34 U/L — ABNORMAL HIGH (ref 6–29)
AST: 35 U/L (ref 10–35)
Albumin: 4.2 g/dL (ref 3.6–5.1)
Alkaline phosphatase (APISO): 80 U/L (ref 37–153)
BUN: 15 mg/dL (ref 7–25)
CO2: 32 mmol/L (ref 20–32)
Calcium: 9.4 mg/dL (ref 8.6–10.4)
Chloride: 99 mmol/L (ref 98–110)
Creat: 0.76 mg/dL (ref 0.50–0.99)
Globulin: 3 g/dL (calc) (ref 1.9–3.7)
Glucose, Bld: 88 mg/dL (ref 65–99)
Potassium: 4 mmol/L (ref 3.5–5.3)
Sodium: 139 mmol/L (ref 135–146)
Total Bilirubin: 0.8 mg/dL (ref 0.2–1.2)
Total Protein: 7.2 g/dL (ref 6.1–8.1)

## 2020-05-22 LAB — LIPID PANEL
Cholesterol: 116 mg/dL (ref <200)
HDL: 42 mg/dL — ABNORMAL LOW (ref >=50)
LDL Cholesterol (Calc): 56 mg/dL (calc)
Non-HDL Cholesterol (Calc): 74 mg/dL (calc) (ref <130)
Total CHOL/HDL Ratio: 2.8 (calc) (ref <5.0)
Triglycerides: 93 mg/dL (ref <150)

## 2020-05-22 LAB — HEMOGLOBIN A1C
Hgb A1c MFr Bld: 5.4 % of total Hgb (ref <5.7)
Mean Plasma Glucose: 108 (calc)
eAG (mmol/L): 6 (calc)

## 2020-05-22 LAB — CBC WITH DIFFERENTIAL/PLATELET
Absolute Monocytes: 567 cells/uL (ref 200–950)
Basophils Absolute: 32 cells/uL (ref 0–200)
Basophils Relative: 0.6 %
Eosinophils Absolute: 259 cells/uL (ref 15–500)
Eosinophils Relative: 4.8 %
HCT: 41.3 % (ref 35.0–45.0)
Hemoglobin: 13.7 g/dL (ref 11.7–15.5)
Lymphs Abs: 1215 cells/uL (ref 850–3900)
MCH: 29.5 pg (ref 27.0–33.0)
MCHC: 33.2 g/dL (ref 32.0–36.0)
MCV: 88.8 fL (ref 80.0–100.0)
MPV: 10.6 fL (ref 7.5–12.5)
Monocytes Relative: 10.5 %
Neutro Abs: 3326 cells/uL (ref 1500–7800)
Neutrophils Relative %: 61.6 %
Platelets: 237 10*3/uL (ref 140–400)
RBC: 4.65 10*6/uL (ref 3.80–5.10)
RDW: 12.7 % (ref 11.0–15.0)
Total Lymphocyte: 22.5 %
WBC: 5.4 10*3/uL (ref 3.8–10.8)

## 2020-05-29 ENCOUNTER — Ambulatory Visit
Admission: RE | Admit: 2020-05-29 | Discharge: 2020-05-29 | Disposition: A | Discharge status: Home / Self Care | Payer: Medicare Other | Source: Ambulatory Visit | Attending: Family Medicine | Admitting: Family Medicine

## 2020-05-29 ENCOUNTER — Other Ambulatory Visit: Payer: Self-pay

## 2020-05-29 DIAGNOSIS — R928 Other abnormal and inconclusive findings on diagnostic imaging of breast: Secondary | ICD-10-CM | POA: Diagnosis not present

## 2020-05-29 DIAGNOSIS — N644 Mastodynia: Secondary | ICD-10-CM

## 2020-05-29 DIAGNOSIS — N6489 Other specified disorders of breast: Secondary | ICD-10-CM | POA: Diagnosis not present

## 2020-07-24 ENCOUNTER — Encounter: Payer: Self-pay | Admitting: Family Medicine

## 2020-07-30 ENCOUNTER — Telehealth: Payer: Self-pay

## 2020-07-30 NOTE — Telephone Encounter (Signed)
Pt scheduled for OV tomorrow.

## 2020-07-30 NOTE — Telephone Encounter (Signed)
Nurse Assessment Nurse: Zenia Resides, RN, Diane Date/Time Eilene Ghazi Time): 07/30/2020 10:10:22 AM Confirm and document reason for call. If symptomatic, describe symptoms. ---Caller states she has some left upper quadrant pain up near her rib cage and towards the flank. Also has some on the right. Had elevated LFTs in August. Symptoms since Friday. Pt. is hydrating. Pt. gets nauseated when she lays flat. No jaundice. Pt. has had diarrhea x 1 and it was "oily". Pain level is a 6-7. Does the patient have any new or worsening symptoms? ---Yes Will a triage be completed? ---Yes Related visit to physician within the last 2 weeks? ---No Does the PT have any chronic conditions? (i.e. diabetes, asthma, this includes High risk factors for pregnancy, etc.) ---Yes List chronic conditions. ---HTN, high cholesterol Is this a behavioral health or substance abuse call? ---No Guidelines Guideline Title Affirmed Question Affirmed Notes Nurse Date/Time Eilene Ghazi Time) Abdominal Pain - Upper [1] MILD-MODERATE pain AND [2] constant AND [3] present > 2 hours Zenia Resides, RN, Diane 07/30/2020 10:13:47 AM Disp. Time Eilene Ghazi Time) Disposition Final User 07/30/2020 10:18:10 AM See HCP within 4 Hours (or PCP triage) Yes Zenia Resides, RN, Diane PLEASE NOTE: All timestamps contained within this report are represented as [place] Standard Time. CONFIDENTIALTY NOTICE: This fax transmission is intended only for the addressee. It contains information that is legally privileged, confidential or otherwise protected from use or disclosure. If you are not the intended recipient, you are strictly prohibited from reviewing, disclosing, copying using or disseminating any of this information or taking any action in reliance on or regarding this information. If you have received this fax in error, please notify us immediately by telephone so that we can arrange for its return to [place]. Phone: [phone], Toll-Free: [phone], Fax:  [phone] Page: 2 of 2 Call Id: [id number] [place] Disagree/Comply Disagree Caller Understands Yes PreDisposition Call Doctor Care Advice Given Per Guideline * IF OFFICE WILL BE OPEN: You need to be seen within the next 3 or 4 hours. Call your doctor (or NP/PA) now or as soon as the office opens. SEE HCP (OR PCP TRIAGE) WITHIN 4 HOURS: CARE ADVICE given per Abdominal Pain, Upper (Adult) guideline. * You become worse CALL BACK IF: Comments User: Hildred Priest, RN Date/Time (Eastern Time): 07/30/2020 10:21:16 AM Pt refusing ER or UC (no PCP appts. available for today)- wants and appt. with PCP. Warm transferred pt. for appt. Referrals REFERRED TO PCP OFFIC

## 2020-07-31 ENCOUNTER — Encounter: Payer: Self-pay | Admitting: Physician Assistant

## 2020-07-31 ENCOUNTER — Ambulatory Visit (HOSPITAL_COMMUNITY)
Admission: RE | Admit: 2020-07-31 | Discharge: 2020-07-31 | Disposition: A | Discharge status: Home / Self Care | Payer: Medicare Other | Source: Ambulatory Visit | Attending: Physician Assistant | Admitting: Physician Assistant

## 2020-07-31 ENCOUNTER — Ambulatory Visit (INDEPENDENT_AMBULATORY_CARE_PROVIDER_SITE_OTHER): Payer: Medicare Other | Admitting: Physician Assistant

## 2020-07-31 ENCOUNTER — Other Ambulatory Visit: Payer: Self-pay

## 2020-07-31 VITALS — BP 140/80 | HR 56 | Temp 98.5°F | Ht 62.5 in | Wt 222.0 lb

## 2020-07-31 DIAGNOSIS — R109 Unspecified abdominal pain: Secondary | ICD-10-CM | POA: Diagnosis not present

## 2020-07-31 DIAGNOSIS — R1012 Left upper quadrant pain: Secondary | ICD-10-CM | POA: Diagnosis not present

## 2020-07-31 LAB — COMPREHENSIVE METABOLIC PANEL
AG Ratio: 1.4 (calc) (ref 1.0–2.5)
ALT: 25 U/L (ref 6–29)
AST: 23 U/L (ref 10–35)
Albumin: 4.5 g/dL (ref 3.6–5.1)
Alkaline phosphatase (APISO): 88 U/L (ref 37–153)
BUN: 11 mg/dL (ref 7–25)
CO2: 32 mmol/L (ref 20–32)
Calcium: 10.1 mg/dL (ref 8.6–10.4)
Chloride: 98 mmol/L (ref 98–110)
Creat: 0.79 mg/dL (ref 0.50–0.99)
Globulin: 3.2 g/dL (calc) (ref 1.9–3.7)
Glucose, Bld: 115 mg/dL — ABNORMAL HIGH (ref 65–99)
Potassium: 4.4 mmol/L (ref 3.5–5.3)
Sodium: 138 mmol/L (ref 135–146)
Total Bilirubin: 0.9 mg/dL (ref 0.2–1.2)
Total Protein: 7.7 g/dL (ref 6.1–8.1)

## 2020-07-31 LAB — CBC WITH DIFFERENTIAL/PLATELET
Absolute Monocytes: 702 cells/uL (ref 200–950)
Basophils Absolute: 36 cells/uL (ref 0–200)
Basophils Relative: 0.4 %
Eosinophils Absolute: 198 cells/uL (ref 15–500)
Eosinophils Relative: 2.2 %
HCT: 43.8 % (ref 35.0–45.0)
Hemoglobin: 14.4 g/dL (ref 11.7–15.5)
Lymphs Abs: 1431 cells/uL (ref 850–3900)
MCH: 29.3 pg (ref 27.0–33.0)
MCHC: 32.9 g/dL (ref 32.0–36.0)
MCV: 89 fL (ref 80.0–100.0)
MPV: 10.1 fL (ref 7.5–12.5)
Monocytes Relative: 7.8 %
Neutro Abs: 6633 cells/uL (ref 1500–7800)
Neutrophils Relative %: 73.7 %
Platelets: 295 10*3/uL (ref 140–400)
RBC: 4.92 10*6/uL (ref 3.80–5.10)
RDW: 12.9 % (ref 11.0–15.0)
Total Lymphocyte: 15.9 %
WBC: 9 10*3/uL (ref 3.8–10.8)

## 2020-07-31 LAB — LIPASE: Lipase: 10 U/L (ref 7–60)

## 2020-07-31 LAB — POC URINALSYSI DIPSTICK (AUTOMATED)
Bilirubin, UA: NEGATIVE
Blood, UA: NEGATIVE
Glucose, UA: NEGATIVE
Ketones, UA: NEGATIVE
Leukocytes, UA: NEGATIVE
Nitrite, UA: NEGATIVE
Protein, UA: NEGATIVE
Spec Grav, UA: 1.015 (ref 1.010–1.025)
Urobilinogen, UA: 0.2 E.U./dL
pH, UA: 7.5 (ref 5.0–8.0)

## 2020-07-31 MED ORDER — IOHEXOL 9 MG/ML PO SOLN
ORAL | Status: AC
  Administered 2020-07-31: 1000 mL
  Filled 2020-07-31: qty 1000

## 2020-07-31 MED ORDER — IOHEXOL 300 MG/ML  SOLN
100.0000 mL | Freq: Once | INTRAMUSCULAR | Status: AC | PRN
  Administered 2020-07-31: 100 mL via INTRAVENOUS

## 2020-07-31 NOTE — Patient Instructions (Addendum)
It was great to see you!  We are getting stat labs today, as well as a CT scan.  I will be in touch with the results as soon as they return.  In the meantime, get help right away if: 1. Your pain does not go away as soon as your doctor says it should. 2. You cannot stop vomiting. 3. Your pain is only in areas of your belly, such as the right side or the left lower part of the belly. 4. You have bloody or black poop, or poop that looks like tar. 5. You have very bad pain, cramping, or bloating in your belly. 6. You have signs of not having enough fluid or water in your body (dehydration), such as: ? Dark pee, very little pee, or no pee. ? Cracked lips. ? Dry mouth. ? Sunken eyes. ? Sleepiness. ? Weakness. 7. You have trouble breathing or chest pain.

## 2020-07-31 NOTE — Progress Notes (Signed)
Savannah Morrow is a 64 y.o. female here for a new problem.  I acted as a Education administrator for [employer], PA-C Savannah Pickler, LPN   History of Present Illness:   Chief Complaint  Patient presents with  . Abdominal Pain    HPI   Abdominal pain Pt c/o LUQ abdominal pain started last Friday evening. Radiates to RUQ and epigastric area. Pt has been having nausea off and on. Denies fever, chills, headaches, chest pain, SOB, blood in stools, vomiting. Pt rates her pain 6-7/10. She has been taking Tylenol with relief. When she lays down flat she has significant nausea.  Since her physical in August she has been trying to limit her fatty foods.    Friday evening had chicken, green beans, coleslaw. Had diarrhea that evening with some oil floating on the the stool.   She has significant hx of abdominal surgeries: appendectomy, colon surgery, hysterectomy.    Past Medical History:  Diagnosis Date  . Allergy   . Arthritis    knees  . Crohn's disease ([place])    colon resection in 20. Dr. Carlean Purl unsure of diagnosis- no evidence on colonoscopy  . Heart murmur   . Hx of adenomatous colonic polyps 03/17/2018  . Hyperlipidemia   . Hypertension   . Uterine cancer ([place]) 1989   s/p hysterectomy     Social History   Tobacco Use  . Smoking status: Never Smoker  . Smokeless tobacco: Never Used  Vaping Use  . Vaping Use: Never used  Substance Use Topics  . Alcohol use: No  . Drug use: No    Past Surgical History:  Procedure Laterality Date  . APPENDECTOMY    . BREAST BIOPSY Left 03/2019   fatty tissue  . COLON SURGERY  1978-79  . COLONOSCOPY    . OOPHORECTOMY     x1  . total hysterectomy    . WISDOM TOOTH EXTRACTION      Family History  Problem Relation Age of Onset  . Uterine cancer Mother   . Alzheimer's disease Mother        died 2019/12/01 . Diabetes Mother   . Heart disease Father   . Hypertension Father   . Cancer Father        b cell lymphoma  . CVA Father         passed from stroke 62  . AVM Sister        8 years younger. brain. significant in hospital 6 weeks. Ltach/trach feeding tube  . Breast cancer Maternal Grandmother   . Diabetes Brother   . Colon cancer Neg Hx   . Esophageal cancer Neg Hx   . Stomach cancer Neg Hx     Allergies  Allergen Reactions  . Meperidine Hcl Anaphylaxis  . Sulfamethoxazole-Trimethoprim Rash    Current Medications:   Current Outpatient Medications:  .  acetaminophen (TYLENOL) 650 MG CR tablet, Take 1,300 mg by mouth every 8 (eight) hours as needed for pain. Takes sparingly for her knees OTC, Disp: , Rfl:  .  atenolol-chlorthalidone (TENORETIC) 50-25 MG tablet, Take 1 tablet by mouth daily., Disp: 90 tablet, Rfl: 3 .  atorvastatin (LIPITOR) 20 MG tablet, Take 1 tablet (20 mg total) by mouth daily., Disp: 90 tablet, Rfl: 3 .  Cholecalciferol (VITAMIN D3) 50 MCG (2000 UT) capsule, Take by mouth daily., Disp: , Rfl:  .  fluticasone (FLONASE) 50 MCG/ACT nasal spray, Place 1 spray into both nostrils daily., Disp: 48 g, Rfl: 3 .  loratadine (CLARITIN) 10 MG tablet, Take 10 mg by mouth daily as needed., Disp: , Rfl:  .  potassium chloride (KLOR-CON) 10 MEQ tablet, TAKE 1 TABLET (10 MEQ TOTAL) BY MOUTH DAILY., Disp: 90 tablet, Rfl: 3   Review of Systems:   ROS Negative unless otherwise specified per HPI.  Vitals:   Vitals:   07/31/20 1000  BP: 140/80  Pulse: (!) 56  Temp: 98.5 F (36.9 C)  TempSrc: Temporal  SpO2: 99%  Weight: 222 lb (100.7 kg)  Height: 5' 2.5" (1.588 m)     Body mass index is 39.96 kg/m.  Physical Exam:   Physical Exam Vitals and nursing note reviewed.  Constitutional:      General: She is not in acute distress.    Appearance: She is well-developed. She is not ill-appearing or toxic-appearing.  Cardiovascular:     Rate and Rhythm: Normal rate and regular rhythm.     Pulses: Normal pulses.     Heart sounds: Normal heart sounds, S1 normal and S2 normal.     Comments: No LE  edema Pulmonary:     Effort: Pulmonary effort is normal.     Breath sounds: Normal breath sounds.  Abdominal:     General: Abdomen is flat. Bowel sounds are normal.     Palpations: Abdomen is soft.     Tenderness: There is abdominal tenderness in the left upper quadrant. There is guarding. There is no right CVA tenderness, left CVA tenderness or rebound.  Skin:    General: Skin is warm and dry.  Neurological:     Mental Status: She is alert.     GCS: GCS eye subscore is 4. GCS verbal subscore is 5. GCS motor subscore is 6.  Psychiatric:        Speech: Speech normal.        Behavior: Behavior normal. Behavior is cooperative.    Results for orders placed or performed in visit on 07/31/20  POCT Urinalysis Dipstick (Automated)  Result Value Ref Range   Color, UA yellow    Clarity, UA clear    Glucose, UA Negative Negative   Bilirubin, UA Negative    Ketones, UA Negative    Spec Grav, UA 1.015 1.010 - 1.025   Blood, UA Negative    pH, UA 7.5 5.0 - 8.0   Protein, UA Negative Negative   Urobilinogen, UA 0.2 0.2 or 1.0 E.U./dL   Nitrite, UA Negative    Leukocytes, UA Negative Negative    Assessment and Plan:   Savannah Morrow was seen today for abdominal pain.  Diagnoses and all orders for this visit:  Acute LUQ pain She is quite tender on exam. Stat labs and CT abd/pelvis. Offered pain medication and nausea medication but she declined. Remain NPO until scan. DDx includes: gallbladder disease, pancreatitis, gastritis, ulcer, malignancy, other. Discussed that if she were to have any worsening symptoms in the meantime, she needs to go to the ER. -     CBC with Differential/Platelet; Future -     Comprehensive metabolic panel; Future -     Lipase; Future -     CT ABDOMEN PELVIS W CONTRAST; Future -     POCT Urinalysis Dipstick (Automated) -     Lipase -     Comprehensive metabolic panel -     CBC with Differential/Platelet   CMA or LPN served as scribe during this visit. History,  Physical, and Plan performed by medical provider. The above documentation has been reviewed and is  accurate and complete.   Savannah Coke, PA-C

## 2020-08-15 MED FILL — ATENOLOL/CHLORTHAL 50/25: 50-25 | 90 days supply | Qty: 90 | Fill #1

## 2020-08-15 MED FILL — ATORVASTATIN 20 MG TABLET: 20 | 90 days supply | Qty: 90 | Fill #1

## 2020-08-15 MED FILL — POTASSIUM CHLORIDE CRYS ER: 10 | 90 days supply | Qty: 90 | Fill #1

## 2020-11-07 MED FILL — ATENOLOL/CHLORTHAL 50/25: 50-25 | 90 days supply | Qty: 90 | Fill #2

## 2020-11-07 MED FILL — POTASSIUM CHLORIDE CRYS ER: 10 | 90 days supply | Qty: 90 | Fill #2

## 2020-11-07 MED FILL — ATORVASTATIN CALCIUM 20 MG: 20 | 90 days supply | Qty: 90 | Fill #2

## 2020-11-12 ENCOUNTER — Telehealth (INDEPENDENT_AMBULATORY_CARE_PROVIDER_SITE_OTHER): Payer: Medicare Other | Admitting: Family Medicine

## 2020-11-12 ENCOUNTER — Encounter: Payer: Self-pay | Admitting: Family Medicine

## 2020-11-12 ENCOUNTER — Other Ambulatory Visit: Payer: Self-pay | Admitting: Family Medicine

## 2020-11-12 VITALS — BP 142/90 | HR 56 | Temp 97.3°F | Ht 62.5 in | Wt 220.0 lb

## 2020-11-12 DIAGNOSIS — M5412 Radiculopathy, cervical region: Secondary | ICD-10-CM | POA: Diagnosis not present

## 2020-11-12 DIAGNOSIS — R739 Hyperglycemia, unspecified: Secondary | ICD-10-CM | POA: Diagnosis not present

## 2020-11-12 DIAGNOSIS — I10 Essential (primary) hypertension: Secondary | ICD-10-CM | POA: Diagnosis not present

## 2020-11-12 MED ORDER — CYCLOBENZAPRINE HCL 5 MG PO TABS: 5.0000 mg | ORAL_TABLET | Freq: Three times a day (TID) | ORAL | 1 refills | Status: DC | PRN

## 2020-11-12 MED ORDER — PREDNISONE 20 MG PO TABS: ORAL_TABLET | ORAL | 0 refills | Status: DC

## 2020-11-12 MED FILL — predniSONE 20 MG TABS: 20 | 7 days supply | Qty: 10 | Fill #0

## 2020-11-12 MED FILL — CYCLOBENZAPRINE HCL 5 MG TA: 5 | 10 days supply | Qty: 30 | Fill #0

## 2020-11-12 NOTE — Patient Instructions (Addendum)
  Health Maintenance Due  Topic Date Due  . COVID-19 Vaccine (3 - Booster for Moderna series) Patient has to reschedule this appoint due to the weather.  08/08/2020    Depression screen PHQ 2/9 11/12/2020 05/21/2020 05/17/2020  Decreased Interest 0 0 0  Down, Depressed, Hopeless 0 0 0  PHQ - 2 Score 0 0 0  Altered sleeping - 0 -  Tired, decreased energy - 0 -  Change in appetite - 0 -  Feeling bad or failure about yourself  - 0 -  Trouble concentrating - 0 -  Moving slowly or fidgety/restless - 0 -  Suicidal thoughts - 0 -  PHQ-9 Score - 0 -  Difficult doing work/chores - Not difficult at all -    Recommended follow up: No follow-ups on file.

## 2020-11-12 NOTE — Progress Notes (Signed)
Phone [phone] Virtual visit via Video note   Subjective:  Chief complaint: Chief Complaint  Patient presents with  . Arthritis    Pt states that she having pain from her neck down through her arm. She states that she had this around the same time last year. Patient has tried a number of OTC remedies. She states that you prescribed her prednisone and flexeril for a few days and it help.  Pain level 9    This visit type was conducted due to national recommendations for restrictions regarding the COVID-19 Pandemic (e.g. social distancing).  This format is felt to be most appropriate for this patient at this time balancing risks to patient and risks to population by having him in for in person visit.  No physical exam was performed (except for noted visual exam or audio findings with Telehealth visits).    Our team/I connected with Savannah Morrow at 11:40 AM EST by a video enabled telemedicine application ([email or web address] or caregility through epic) and verified that I am speaking with the correct person using two identifiers.  Location patient: [place] Location provider: [facility] - , office Persons participating in the virtual visit:  patient  Our team/I discussed the limitations of evaluation and management by telemedicine and the availability of in person appointments. In light of current covid-19 pandemic, patient also understands that we are trying to protect them by minimizing in office contact if at all possible.  The patient expressed consent for telemedicine visit and agreed to proceed. Patient understands insurance will be billed.   Past Medical History-  Patient Active Problem List   Diagnosis Date Noted  . Morbid obesity ([place]) 03/07/2011    Priority: High  . Hx of adenomatous colonic polyps 03/17/2018    Priority: Medium  . Family history of cerebrovascular accident (CVA) in sister 10/06/2017    Priority: Medium  . Hyperglycemia 04/07/2016    Priority: Medium  . Crohn's disease  ([place])     Priority: Medium  . Hyperlipidemia 06/02/2007    Priority: Medium  . Essential hypertension 06/02/2007    Priority: Medium  . Chest pain 03/30/2014    Priority: Low  . Osteoarthritis of both knees 01/10/2010    Priority: Low  . Edema 01/10/2010    Priority: Low  . Allergic rhinitis 06/02/2007    Priority: Low  . Osteopenia 05/21/2020  . Hearing loss 11/11/2019  . S/P total hysterectomy 04/14/2018    Medications- reviewed and updated Current Outpatient Medications  Medication Sig Dispense Refill  . acetaminophen (TYLENOL) 650 MG CR tablet Take 1,300 mg by mouth every 8 (eight) hours as needed for pain. Takes sparingly for her knees OTC    . atenolol-chlorthalidone (TENORETIC) 50-25 MG tablet Take 1 tablet by mouth daily. 90 tablet 3  . atorvastatin (LIPITOR) 20 MG tablet Take 1 tablet (20 mg total) by mouth daily. 90 tablet 3  . Cholecalciferol (VITAMIN D3) 50 MCG (2000 UT) capsule Take by mouth daily.    . cyclobenzaprine (FLEXERIL) 5 MG tablet Take 1 tablet (5 mg total) by mouth 3 (three) times daily as needed for muscle spasms (do not drive for 6 hours after use). 30 tablet 1  . fluticasone (FLONASE) 50 MCG/ACT nasal spray Place 1 spray into both nostrils daily. 48 g 3  . loratadine (CLARITIN) 10 MG tablet Take 10 mg by mouth daily as needed.    . potassium chloride (KLOR-CON) 10 MEQ tablet TAKE 1 TABLET (10 MEQ TOTAL) BY MOUTH DAILY. 90 tablet 3  .  predniSONE (DELTASONE) 20 MG tablet Take 2 pills for 3 days, 1 pill for 4 days 10 tablet 0   No current facility-administered medications for this visit.     Objective:  BP (!) 142/90   Pulse (!) 56   Temp (!) 97.3 F (36.3 C) (Oral)   Ht 5' 2.5" (1.588 m)   Wt 220 lb (99.8 kg)   LMP  (LMP Unknown)   BMI 39.60 kg/m  self reported vitals Gen: Patient appears to be very uncomfortable-tearful at times related to pain Lungs: nonlabored, normal respiratory rate  Skin: appears dry, no obvious rash Normal reported grip  strength-patient able to hold objects without dropping them on video today     Assessment and Plan   #Right neck/shoulder/arm pain-concerning for cervical radiculopathy S: patient reports pain from right neck into her shoulder and getting some cramping down into her right arm- can even get some tingling to her wrist with certain movements. Tylenol 8 hour arthritis, salonpass, biofreeze with some relief at first as well as heating pad- not helping in last few days though when pain worsened.   Pain started last week when it got really cold but worsened over the weekend. Notes some slight swelling in joints of hand- not as painful. No swelling in upper arm or elbow  Pain seems severe up to 9/10.  No hand weakness.  A/P: Patient with symptoms concerning for cervical radiculopathy. She had upper back and neck pain last year which responded well to prednisone-she seems to have more significant symptoms today including radicular symptoms-we opted to retrial course of prednisone-we'll use higher dose now since A1c has trended down. -She also asked for refill on muscle relaxant which was very helpful for her last year. Seems to have worsening issues each winter. -f fails to improve within 2 weeks or symptoms worsen would like to have her see sports medicine- we see her on the 2nd so we will check in at that time.   # Hyperglycemia/insulin resistance/prediabetes S:  Medication: None Lab Results  Component Value Date   HGBA1C 5.4 05/21/2020   HGBA1C 5.7 05/11/2019   HGBA1C 5.5 11/09/2018   A/P: Patient with significant lifestyle changes and has been able to bring her A1c down-I think we're in a safer position and can trial prednisone at this time  #hypertension S: medication: Atenolol chlorthalidone 50-25 mg Home readings #s:  128/78 before pain started BP Readings from Last 3 Encounters:  11/12/20 (!) 142/90  07/31/20 140/80  05/21/20 (!) 132/64  A/P: Blood pressure was well controlled before  increased pain. I think her current elevated blood pressure is related to pain-we discussed anticipated blood pressure would actually get better with prednisone due to potential decrease in pain. For now we opted to continue current blood pressure medication  Recommended follow up: Scheduled for February 2 follow-up Future Appointments  Date Time Provider [place]  11/21/2020  8:20 AM Marin Olp, MD [facility]  05/23/2021  1:45 PM [facility] COACH [facility]    Lab/Order associations:   ICD-10-CM   1. Right cervical radiculopathy  M54.12   2. Essential hypertension  I10   3. Hyperglycemia  R73.9     Meds ordered this encounter  Medications  . predniSONE (DELTASONE) 20 MG tablet    Sig: Take 2 pills for 3 days, 1 pill for 4 days    Dispense:  10 tablet    Refill:  0  . cyclobenzaprine (FLEXERIL) 5 MG tablet  Sig: Take 1 tablet (5 mg total) by mouth 3 (three) times daily as needed for muscle spasms (do not drive for 6 hours after use).    Dispense:  30 tablet    Refill:  1   Return precautions advised.  Garret Reddish, MD

## 2020-11-20 NOTE — Progress Notes (Signed)
Phone [phone] In person visit   Subjective:   Savannah Morrow is a 65 y.o. year old very pleasant female patient who presents for/with See problem oriented charting Chief Complaint  Patient presents with  . Hypertension  . Hyperlipidemia   This visit occurred during the SARS-CoV-2 public health emergency.  Safety protocols were in place, including screening questions prior to the visit, additional usage of staff PPE, and extensive cleaning of exam room while observing appropriate contact time as indicated for disinfecting solutions.   Past Medical History-  Patient Active Problem List   Diagnosis Date Noted  . Morbid obesity ([place]) 03/07/2011    Priority: High  . Hx of adenomatous colonic polyps 03/17/2018    Priority: Medium  . Family history of cerebrovascular accident (CVA) in sister 10/06/2017    Priority: Medium  . Hyperglycemia 04/07/2016    Priority: Medium  . Crohn's disease ([place])     Priority: Medium  . Hyperlipidemia 06/02/2007    Priority: Medium  . Essential hypertension 06/02/2007    Priority: Medium  . Chest pain 03/30/2014    Priority: Low  . Osteoarthritis of both knees 01/10/2010    Priority: Low  . Edema 01/10/2010    Priority: Low  . Allergic rhinitis 06/02/2007    Priority: Low  . Osteopenia 05/21/2020  . Hearing loss 11/11/2019  . S/P total hysterectomy 04/14/2018    Medications- reviewed and updated Current Outpatient Medications  Medication Sig Dispense Refill  . acetaminophen (TYLENOL) 650 MG CR tablet Take 1,300 mg by mouth every 8 (eight) hours as needed for pain. Takes sparingly for her knees OTC    . atenolol-chlorthalidone (TENORETIC) 50-25 MG tablet Take 1 tablet by mouth daily. 90 tablet 3  . atorvastatin (LIPITOR) 20 MG tablet Take 1 tablet (20 mg total) by mouth daily. 90 tablet 3  . Cholecalciferol (VITAMIN D3) 50 MCG (2000 UT) capsule Take by mouth daily.    . cyclobenzaprine (FLEXERIL) 5 MG tablet Take 1 tablet (5 mg total) by  mouth 3 (three) times daily as needed for muscle spasms (do not drive for 6 hours after use). 30 tablet 1  . fluticasone (FLONASE) 50 MCG/ACT nasal spray Place 1 spray into both nostrils daily. 48 g 3  . loratadine (CLARITIN) 10 MG tablet Take 10 mg by mouth daily as needed.    . potassium chloride (KLOR-CON) 10 MEQ tablet TAKE 1 TABLET (10 MEQ TOTAL) BY MOUTH DAILY. 90 tablet 3  . predniSONE (DELTASONE) 20 MG tablet Take 2 pills for 3 days, 1 pill for 4 days 10 tablet 0   No current facility-administered medications for this visit.     Objective:  BP 118/84   Pulse 61   Temp 98.7 F (37.1 C) (Temporal)   Ht 5' 3"  (1.6 m)   Wt 218 lb 3.2 oz (99 kg)   LMP  (LMP Unknown)   SpO2 99%   BMI 38.65 kg/m  Gen: NAD, resting comfortably CV: RRR  Lungs: nonlabored, normal respiratory rate Abdomen: soft/nondistended Ext: trace edema Skin: warm, dry, no rash over leg Neuro: grossly normal, moves all extremities MSK: medial joint line pain significant. Meniscus testing and ligament testing reassuring.   Verbal consent obtained and verified for Right Knee injection Sterile betadine prep. Furthur cleansed with alcohol. Topical analgesic spray: Ethyl chloride. Joint: right knee Approached in typical fashion with: anteromedial approach Completed without difficulty Meds: 4 cc lidocaine without epinephrine and 1 cc of depo medrol 40 mg/cc Needle:1 1/2 inch  25 gauge needle Aftercare instructions and Red flags advised.       Assessment and Plan   #hypertension S: medication: atenolol-chlorthalidone 50-25Mg BP Readings from Last 3 Encounters:  11/21/20 118/84  11/12/20 (!) 142/90  07/31/20 140/80  A/P: Stable. Continue current medications.   #hyperlipidemia S: Medication: Atorvastatin 20Mg Lab Results  Component Value Date   CHOL 116 05/21/2020   HDL 42 (L) 05/21/2020   LDLCALC 56 05/21/2020   LDLDIRECT 63.0 11/09/2018   TRIG 93 05/21/2020   CHOLHDL 2.8 05/21/2020  A/P: well  controlled- continue current meds  # Hyperglycemia/insulin resistance/prediabetes- peak a1c of 5.7.  #morbid obesity-peak weight 237 with BMI >35 and HTN, HLD S:  Medication: none Exercise and diet- got down to 215 on home scales then up slightly on prednisone. Still eating healthy Wt Readings from Last 3 Encounters:  11/21/20 218 lb 3.2 oz (99 kg)  11/12/20 220 lb (99.8 kg)  07/31/20 222 lb (100.7 kg)  A/P: excellent job on lifestyle changes. Update a1c today.   #crohn's disease-  S:  Colon resection in 20s- had also been on prednisone and sulfasalazine for years- dietary changes helped.  Dr. Carlean Purl is not certain this is acurate diagnosis. No evidence on repeat colonoscopy. No blood in stool or abdominal cramping recently  A/P: no recent issues- continue to monitor.   #history of cervical radiculopathy S: history in 2021 and 2022 of neck/shoulder pain and in 2022 included right arm recently. Pain within a week or so has improved from 10/10 to 3/10. Still getting some tingling into the right arm. She had been lifting a lot of cat iron pans.  A/P: right cervical radiculopathy is improving but not fully resolved. Since this is second episode in last year or so we opted to refer to physical therapy she will schedule visits at check out - if new or worsening symptoms or fails to improve will refer to sports medicine  # Right knee pain S:flared up around time of right neck pain- in past has had issues with left knee. has tried heat, ice, elevation, biofreeze, icy hot. In addition to tylenol arthritis. Has even tried some ibuprofen. Has done voltaren gel twice a day. Did improve on prednisone.    Was able to walk over 7k steps a day and now down to under 1000 due to this. Had left knee films in 2011 but none on right knee A/P: right knee pain likely right knee OA. Has failed conservative measures. Discussed possible PT or SM referral but this has been very life limiting and she would strongly  prefer to do injection today. Discussed potential benefit of waiting for ultrasound guided but with more immediate benefit of sooner relief she chooses to proceed today.  - will still refer to PT  Recommended follow up: Return in about 6 months (around 05/21/2021) for physical or sooner if needed. Future Appointments  Date Time Provider [place]  05/23/2021  1:45 PM [facility]   Lab/Order associations:   ICD-10-CM   1. Primary osteoarthritis of right knee  M17.11 Ambulatory referral to Physical Therapy  2. Essential hypertension  I10 Comprehensive metabolic panel  3. Hyperglycemia  R73.9 Hemoglobin A1c  4. Hyperlipidemia, unspecified hyperlipidemia type  E78.5 Comprehensive metabolic panel  5. Crohn's disease with complication, unspecified gastrointestinal tract location [facility]) Chronic K50.919   6. Morbid obesity ([place]) Chronic E66.01   7. Right cervical radiculopathy  M54.12 Ambulatory referral to Physical Therapy   No orders of  the defined types were placed in this encounter.  Return precautions advised.  Garret Reddish, MD

## 2020-11-20 NOTE — Patient Instructions (Addendum)
Please stop by lab before you go If you have mychart- we will send your results within 3 business days of [place] receiving them.  If you do not have mychart- we will call you about results within 5 business days of [place] receiving them.  *please also note that you will see labs on mychart as soon as they post. I will later go in and write notes on them- will say "notes from Dr. Yong Channel"  Health Maintenance Due  Topic Date Due  . COVID-19 Vaccine (3 - Booster for Moderna series) Hasn't been able to get another appointment but please call to schedule this.  08/08/2020   Schedule with PT before you leave  Recommended follow up: Return in about 6 months (around 05/21/2021) for physical or sooner if needed.    You had an injection today.  Things to be aware of after injection are listed below: . You may experience no significant improvement or even a slight worsening in your symptoms during the first 24 to 48 hours.  After that we expect your symptoms to improve gradually over the next 2 weeks for the medicine to have its maximal effect.  You should continue to have improvement out to 6 weeks after your injection. . Dr. Yong Channel recommends icing the site of the injection for 20 minutes at least 2 times the day of your injection . You may shower but no swimming, tub bath or Jacuzzi for 24 hours. . If your bandage falls off this does not need to be replaced.  It is appropriate to remove the bandage after 4 hours. . You may resume light activities as tolerated unless otherwise directed per Dr. Yong Channel during your visit  POSSIBLE STEROID SIDE EFFECTS:  Side effects from injectable steroids tend to be less than when taken orally however you may experience some of the symptoms listed below.  If experienced these should only last for a short period of time. Change in menstrual flow  Edema (swelling)  Increased appetite Skin flushing (redness)  Skin rash/acne  Thrush (oral) Yeast vaginitis    Increased  sweating  Depression Increased blood glucose levels Cramping and leg/calf  Euphoria (feeling happy)  POSSIBLE PROCEDURE SIDE EFFECTS: The side effects of the injection are usually fairly minimal however if you may experience some of the following side effects that are usually self-limited and will is off on their own.  If you are concerned please feel free to call the office with questions:  Increased numbness or tingling  Nausea or vomiting  Swelling or bruising at the injection site   Please call our office if if you experience any of the following symptoms over the next week as these can be signs of infection:   Fever greater than 100.94F  Significant swelling at the injection site  Significant redness or drainage from the injection site  If after 2 weeks you are continuing to have worsening symptoms please call our office to discuss what the next appropriate actions should be including the potential for a return office visit or other diagnostic testing.

## 2020-11-21 ENCOUNTER — Ambulatory Visit (INDEPENDENT_AMBULATORY_CARE_PROVIDER_SITE_OTHER): Payer: Medicare Other | Admitting: Family Medicine

## 2020-11-21 ENCOUNTER — Encounter: Payer: Self-pay | Admitting: Family Medicine

## 2020-11-21 ENCOUNTER — Other Ambulatory Visit: Payer: Self-pay

## 2020-11-21 VITALS — BP 118/84 | HR 61 | Temp 98.7°F | Ht 63.0 in | Wt 218.2 lb

## 2020-11-21 DIAGNOSIS — R739 Hyperglycemia, unspecified: Secondary | ICD-10-CM | POA: Diagnosis not present

## 2020-11-21 DIAGNOSIS — M1711 Unilateral primary osteoarthritis, right knee: Secondary | ICD-10-CM | POA: Diagnosis not present

## 2020-11-21 DIAGNOSIS — E785 Hyperlipidemia, unspecified: Secondary | ICD-10-CM | POA: Diagnosis not present

## 2020-11-21 DIAGNOSIS — M5412 Radiculopathy, cervical region: Secondary | ICD-10-CM

## 2020-11-21 DIAGNOSIS — K50919 Crohn's disease, unspecified, with unspecified complications: Secondary | ICD-10-CM

## 2020-11-21 DIAGNOSIS — I10 Essential (primary) hypertension: Secondary | ICD-10-CM | POA: Diagnosis not present

## 2020-11-21 LAB — COMPREHENSIVE METABOLIC PANEL
ALT: 25 U/L (ref 0–35)
AST: 18 U/L (ref 0–37)
Albumin: 4.2 g/dL (ref 3.5–5.2)
Alkaline Phosphatase: 63 U/L (ref 39–117)
BUN: 17 mg/dL (ref 6–23)
CO2: 34 mEq/L — ABNORMAL HIGH (ref 19–32)
Calcium: 10.1 mg/dL (ref 8.4–10.5)
Chloride: 99 mEq/L (ref 96–112)
Creatinine, Ser: 0.79 mg/dL (ref 0.40–1.20)
GFR: 77.77 mL/min (ref >60.00)
Glucose, Bld: 104 mg/dL — ABNORMAL HIGH (ref 70–99)
Potassium: 4.4 mEq/L (ref 3.5–5.1)
Sodium: 138 mEq/L (ref 135–145)
Total Bilirubin: 0.8 mg/dL (ref 0.2–1.2)
Total Protein: 7.3 g/dL (ref 6.0–8.3)

## 2020-11-21 LAB — HEMOGLOBIN A1C: Hgb A1c MFr Bld: 5.8 % (ref 4.6–6.5)

## 2020-11-21 MED ORDER — METHYLPREDNISOLONE ACETATE 40 MG/ML IJ SUSP
40.0000 mg | Freq: Once | INTRAMUSCULAR | Status: AC
  Administered 2020-11-21: 40 mg via INTRAMUSCULAR

## 2020-11-21 NOTE — Addendum Note (Signed)
Addended by: Linton Ham on: 11/21/2020 10:35 AM   Modules accepted: Orders

## 2020-11-27 ENCOUNTER — Encounter: Payer: Self-pay | Admitting: Family Medicine

## 2020-12-10 ENCOUNTER — Ambulatory Visit: Payer: Medicare Other | Admitting: Physical Therapy

## 2020-12-10 ENCOUNTER — Other Ambulatory Visit: Payer: Self-pay

## 2020-12-10 ENCOUNTER — Encounter: Payer: Self-pay | Admitting: Physical Therapy

## 2020-12-10 DIAGNOSIS — M25561 Pain in right knee: Secondary | ICD-10-CM | POA: Diagnosis not present

## 2020-12-10 DIAGNOSIS — M5412 Radiculopathy, cervical region: Secondary | ICD-10-CM | POA: Diagnosis not present

## 2020-12-10 NOTE — Progress Notes (Signed)
Subjective:    CC: Neck and R shoulder pain  I, Molly Weber, LAT, ATC, am serving as scribe for Dr. Lynne Leader.  HPI: Pt is a 64 y/o female presenting w/ neck and R shoulder pain/cervical radiculopathy since around 11/05/20. Pt locates pain to R-side of c-spine that radiates into shoulder, biceps, and R thumb. Pt c/o muscle spasm through c-spine and shoulder musculature. Pt is R hand dominate.  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   MSK:  C-spine normal. Nontender midline. Tender palpation right trapezius. Significantly decreased range of motion to extension and right rotation right lateral flexion. Strongly positive right Spurling's test. Reflexes and sensation are intact bilateral upper extremities. Diminished strength right deltoid 3/5 normal upper extremity strength bilaterally otherwise. Pulses cap refill and sensation intact distally.   Right shoulder normal-appearing Tender palpation right lateral upper arm. Range of motion diminished abduction with some pain.  Otherwise range of motion intact. Guarding with range of motion and exam testing nondiagnostic.   Mildly positive Hawkins and Neer's test.  Right knee normal-appearing Normal motion. Crepitation with range of motion. Intact strength.   Lab and Radiology Results  X-ray images C-spine and right knee obtained today personally and independently interpreted.  C-spine: DDD  C4-C7.  No fractures or malalignment.  Right knee: Moderate to severe medial compartment DJD.  No fractures.  Await formal radiology review    Impression and Recommendations:    Assessment and Plan: 65 y.o. female with right arm pain due to cervical radiculopathy at C5 or C6 dermatomal pattern.  Patient does have weakness consistent with C5.  She is already had courses of prednisone which helped temporarily.  She is very painful today and developing new weakness.  Plan to proceed with MRI to further characterize cause of pain and weakness and for potential injection planning.  Likely will proceed directly to epidural steroid injection but will offer follow-up visit in clinic to review MRI findings.  For today will prescribe course of prednisone and gabapentin to temporarily control pain and weakness.  Knee pain due to DJD.  Patient had steroid injection that helped some.  We will follow this issue further when she is feeling a bit better.  PDMP not reviewed this encounter. Orders Placed This Encounter  Procedures  . DG Cervical Spine 2 or 3 views    Standing Status:   Future    Number of Occurrences:   1    Standing Expiration Date:   12/11/2021    Order Specific Question:   Reason for Exam (SYMPTOM  OR DIAGNOSIS REQUIRED)    Answer:   eval rt cervical rad ?c6    Order Specific Question:   Preferred imaging location?    Answer:   [facility]  . DG Knee AP/LAT W/Sunrise Right    Standing Status:   Future    Number of Occurrences:   1    Standing Expiration Date:   12/11/2021    Order Specific  Question:   Reason for Exam (SYMPTOM  OR DIAGNOSIS REQUIRED)    Answer:   eval right knee pain    Order Specific Question:   Preferred imaging location?    Answer:   [facility]  . MR CERVICAL SPINE WO CONTRAST    Standing Status:   Future    Standing Expiration Date:   12/11/2021    Order Specific Question:   What is the patient's sedation requirement?    Answer:   No  Sedation    Order Specific Question:   Does the patient have a pacemaker or implanted devices?    Answer:   No    Order Specific Question:   Preferred imaging location?    Answer:   [facility] (table limit-350lbs)   Meds ordered this encounter  Medications  . predniSONE (DELTASONE) 50 MG tablet    Sig: Take 1 tablet (50 mg total) by mouth daily.    Dispense:  5 tablet    Refill:  0  . gabapentin (NEURONTIN) 100 MG capsule    Sig: Take 1-3 capsules (100-300 mg total) by mouth 3 (three) times daily as needed (nerve pain).    Dispense:  180 capsule    Refill:  3    Discussed warning signs or symptoms. Please see discharge instructions. Patient expresses understanding.   The above documentation has been reviewed and is accurate and complete Lynne Leader, M.D.

## 2020-12-11 ENCOUNTER — Other Ambulatory Visit: Payer: Self-pay | Admitting: Family Medicine

## 2020-12-11 ENCOUNTER — Ambulatory Visit (INDEPENDENT_AMBULATORY_CARE_PROVIDER_SITE_OTHER): Payer: Medicare Other

## 2020-12-11 ENCOUNTER — Ambulatory Visit: Payer: Medicare Other | Admitting: Family Medicine

## 2020-12-11 VITALS — BP 155/98 | HR 79 | Ht 63.0 in | Wt 217.8 lb

## 2020-12-11 DIAGNOSIS — M5412 Radiculopathy, cervical region: Secondary | ICD-10-CM

## 2020-12-11 DIAGNOSIS — R29898 Other symptoms and signs involving the musculoskeletal system: Secondary | ICD-10-CM | POA: Diagnosis not present

## 2020-12-11 DIAGNOSIS — M25561 Pain in right knee: Secondary | ICD-10-CM

## 2020-12-11 DIAGNOSIS — M50323 Other cervical disc degeneration at C6-C7 level: Secondary | ICD-10-CM | POA: Diagnosis not present

## 2020-12-11 DIAGNOSIS — M50321 Other cervical disc degeneration at C4-C5 level: Secondary | ICD-10-CM | POA: Diagnosis not present

## 2020-12-11 DIAGNOSIS — M25861 Other specified joint disorders, right knee: Secondary | ICD-10-CM | POA: Diagnosis not present

## 2020-12-11 DIAGNOSIS — M50322 Other cervical disc degeneration at C5-C6 level: Secondary | ICD-10-CM | POA: Diagnosis not present

## 2020-12-11 MED ORDER — PREDNISONE 50 MG PO TABS: 50.0000 mg | ORAL_TABLET | Freq: Every day | ORAL | 0 refills | Status: DC

## 2020-12-11 MED ORDER — GABAPENTIN 100 MG PO CAPS: 100.0000 mg | ORAL_CAPSULE | Freq: Three times a day (TID) | ORAL | 3 refills | Status: DC | PRN

## 2020-12-11 MED FILL — GABAPENTIN 100 MG CAPSULE: 100 | 20 days supply | Qty: 180 | Fill #0

## 2020-12-11 MED FILL — predniSONE 50 MG TABS: 50 | 5 days supply | Qty: 5 | Fill #0

## 2020-12-11 NOTE — Patient Instructions (Addendum)
Thank you for coming in today  Please get an Xray today before you leave  You should hear from MRI scheduling within 1 week. If you do not hear please let me know.   Use the prednisone and gabapentin.   Recheck following MRI.   Hold on PT until after MRI.

## 2020-12-12 NOTE — Progress Notes (Signed)
X-ray cervical spine shows mild arthritis.

## 2020-12-12 NOTE — Progress Notes (Signed)
X-ray right knee shows severe arthritis

## 2020-12-15 ENCOUNTER — Other Ambulatory Visit: Payer: Self-pay

## 2020-12-15 ENCOUNTER — Other Ambulatory Visit: Payer: Medicare Other

## 2020-12-15 ENCOUNTER — Ambulatory Visit (INDEPENDENT_AMBULATORY_CARE_PROVIDER_SITE_OTHER): Payer: Medicare Other

## 2020-12-15 DIAGNOSIS — M50322 Other cervical disc degeneration at C5-C6 level: Secondary | ICD-10-CM | POA: Diagnosis not present

## 2020-12-15 DIAGNOSIS — M542 Cervicalgia: Secondary | ICD-10-CM | POA: Diagnosis not present

## 2020-12-15 DIAGNOSIS — M4802 Spinal stenosis, cervical region: Secondary | ICD-10-CM

## 2020-12-15 DIAGNOSIS — M50222 Other cervical disc displacement at C5-C6 level: Secondary | ICD-10-CM

## 2020-12-15 DIAGNOSIS — M50323 Other cervical disc degeneration at C6-C7 level: Secondary | ICD-10-CM

## 2020-12-15 DIAGNOSIS — M5412 Radiculopathy, cervical region: Secondary | ICD-10-CM

## 2020-12-17 ENCOUNTER — Telehealth: Payer: Self-pay | Admitting: Family Medicine

## 2020-12-17 ENCOUNTER — Encounter: Payer: Medicare Other | Admitting: Physical Therapy

## 2020-12-17 DIAGNOSIS — M5412 Radiculopathy, cervical region: Secondary | ICD-10-CM

## 2020-12-17 NOTE — Progress Notes (Signed)
I, Wendy Poet, LAT, ATC, am serving as scribe for Dr. Lynne Leader.  Savannah Morrow is a 65 y.o. female who presents to [place] [facility] today for f/u of neck and R shoulder/arm pain and for c-spine MRI review.  She was last seen by Dr. Georgina Snell on 12/11/20 and reported numbness/tingling into R fingers 1 and 2 and decreased grip strength.  She was prescribed prednisone and gabpentin and was referred for an MRI.  Since her last visit w/ Dr. Georgina Snell, pt reports that the prednisone helped w/ her level of pain.  However she con't to have pain in her neck and into the R UE w/ numbness/tingling in her R arm down into her R thumb and index finger.  She con't to take the Gabapentin.  She also reports that her cervical ESI is scheduled for this Thursday, March 3rd.  Diagnostic testing: C-spine MRI- 12/15/20; C-spine XR- 12/11/20   Pertinent review of systems: No fevers or chills  Relevant historical information: Hypertension   Exam:  BP 124/78 (BP Location: Left Arm, Patient Position: Sitting, Cuff Size: Large)   Pulse 84   Ht 5' 3"  (1.6 m)   Wt 220 lb (99.8 kg)   LMP  (LMP Unknown)   SpO2 99%   BMI 38.97 kg/m  General: Well Developed, well nourished, and in no acute distress.   MSK: C-spine normal-appearing decreased cervical motion.  Upper extremity strength is intact.    Lab and Radiology Results No results found for this or any previous visit (from the past 72 hour(s)). MR CERVICAL SPINE WO CONTRAST  Result Date: 12/16/2020 CLINICAL DATA:  Neck pain radiating down the left arm. Ongoing for 5 weeks. EXAM: MRI CERVICAL SPINE WITHOUT CONTRAST TECHNIQUE: Multiplanar, multisequence MR imaging of the cervical spine was performed. No intravenous contrast was administered. COMPARISON:  None. FINDINGS: Alignment: Physiologic. Vertebrae: No fracture, evidence of discitis, or bone lesion. Cord: Normal signal and morphology. Posterior Fossa, vertebral arteries, paraspinal  tissues: Posterior fossa demonstrates no focal abnormality. Vertebral artery flow voids are maintained. Paraspinal soft tissues are unremarkable. Disc levels: Discs: Degenerative disease with disc height loss at C5-6 and C6-7. C2-3: No significant disc bulge. No neural foraminal stenosis. No central canal stenosis. C3-4: No disc protrusion, foraminal stenosis or central canal stenosis. C4-5: Minimal broad-based disc bulge. Mild bilateral foraminal stenosis. No central canal stenosis. C5-6: Broad-based disc bulge with a right paracentral disc protrusion mildly deforming the ventral right paracentral cervical spinal cord. Mild spinal stenosis. No left foraminal stenosis. Moderate right foraminal stenosis. C6-7: Broad-based disc osteophyte complex flattening the ventral thecal sac. Mild spinal stenosis. Bilateral uncovertebral degenerative changes. Moderate bilateral foraminal stenosis. C7-T1: No significant disc bulge. No neural foraminal stenosis. No central canal stenosis. IMPRESSION: 1. At C5-6 there is a broad-based disc bulge with a right paracentral disc protrusion mildly deforming the ventral right paracentral cervical spinal cord. Mild spinal stenosis. Moderate right foraminal stenosis. 2. At C6-7 there is a broad-based disc osteophyte complex flattening the ventral thecal sac. Mild spinal stenosis. Bilateral uncovertebral degenerative changes. Moderate bilateral foraminal stenosis. Electronically Signed   By: Kathreen Devoid   On: 12/16/2020 08:52   I, Lynne Leader, personally (independently) visualized and performed the interpretation of the images attached in this note.     Assessment and Plan: 65 y.o. female with cervical radiculopathy involving right arm.  Patient had short-term response to oral steroids.  She has epidural steroid scheduled for later this week which should be  quite helpful.  Discussed treatment plan if the epidural steroid injection does not provide lasting benefit.  Would recommend  trial of 2 shots and if not much better would recommend referral to neurosurgery.  Continue gabapentin as needed.  Recheck back as needed.    Discussed warning signs or symptoms. Please see discharge instructions. Patient expresses understanding.   The above documentation has been reviewed and is accurate and complete Lynne Leader, M.D.  Total encounter time 20 minutes including face-to-face time with the patient and, reviewing past medical record, and charting on the date of service.   Treatment plan and options and MRI findings.

## 2020-12-17 NOTE — Telephone Encounter (Signed)
Epidural steroid injection ordered.  Please contact [facility] at [phone] to schedule.

## 2020-12-17 NOTE — Telephone Encounter (Signed)
Pt seeing Dr. Georgina Snell for f/u tomorrow 12/18/20 and will advise her regarding ESI order.

## 2020-12-17 NOTE — Progress Notes (Signed)
MRI cervical spine shows broad-based disc bulge on the right C5-C6 level that is compressing the spinal cord and nerve roots that is probably causing your arm pain and weakness.  Additionally there is a bulging disc at C6-C7 there is also causing problems.  I have already ordered an epidural steroid injection which should help.  Please contact [facility] to schedule this.  Phone number is [phone].  If you would like to schedule a follow-up visit to go over the results in full detail please call my office and do so.

## 2020-12-18 ENCOUNTER — Encounter: Payer: Self-pay | Admitting: Family Medicine

## 2020-12-18 ENCOUNTER — Other Ambulatory Visit: Payer: Self-pay

## 2020-12-18 ENCOUNTER — Ambulatory Visit: Payer: Medicare Other | Admitting: Family Medicine

## 2020-12-18 ENCOUNTER — Other Ambulatory Visit: Payer: Medicare Other

## 2020-12-18 VITALS — BP 124/78 | HR 84 | Ht 63.0 in | Wt 220.0 lb

## 2020-12-18 DIAGNOSIS — M5412 Radiculopathy, cervical region: Secondary | ICD-10-CM

## 2020-12-18 DIAGNOSIS — M4802 Spinal stenosis, cervical region: Secondary | ICD-10-CM

## 2020-12-18 NOTE — Patient Instructions (Signed)
Thank you for coming in today.  Take the gabapentin as needed only.   Plan for the epidural steroid injection Thursday.   Let me know if you need a second shot.   If not improving next step is neurosurgery evaluation. Let me know.    Epidural Steroid Injection  An epidural steroid injection is a shot of steroid medicine and numbing medicine that is given into the space between the spinal cord and the bones of the back (epidural space). The shot helps relieve pain caused by an irritated or swollen nerve root. The amount of pain relief you get from the injection depends on what is causing the nerve to be swollen and irritated, and how long your pain lasts. You are more likely to benefit from this injection if your pain is strong and comes on suddenly rather than if you have had long-term (chronic) pain. Tell a health care provider about:  Any allergies you have.  All medicines you are taking, including vitamins, herbs, eye drops, creams, and over-the-counter medicines.  Any problems you or family members have had with anesthetic medicines.  Any blood disorders you have.  Any surgeries you have had.  Any medical conditions you have.  Whether you are pregnant or may be pregnant. What are the risks? Generally, this is a safe procedure. However, problems may occur, including:  Headache.  Bleeding.  Infection.  Allergic reaction to medicines.  Nerve damage. What happens before the procedure? Staying hydrated Follow instructions from your health care provider about hydration, which may include:  Up to 2 hours before the procedure - you may continue to drink clear liquids, such as water, clear fruit juice, black coffee, and plain tea. Eating and drinking restrictions Follow instructions from your health care provider about eating and drinking, which may include:  8 hours before the procedure - stop eating heavy meals or foods, such as meat, fried foods, or fatty foods.  6  hours before the procedure - stop eating light meals or foods, such as toast or cereal.  6 hours before the procedure - stop drinking milk or drinks that contain milk.  2 hours before the procedure - stop drinking clear liquids. Medicines  You may be given medicines to lower anxiety.  Ask your health care provider about: ? Changing or stopping your regular medicines. This is especially important if you are taking diabetes medicines or blood thinners. ? Taking medicines such as aspirin and ibuprofen. These medicines can thin your blood. Do not take these medicines unless your health care provider tells you to take them. ? Taking over-the-counter medicines, vitamins, herbs, and supplements. General instructions  Ask your health care provider what steps will be taken to prevent infection.  Plan to have a responsible adult take you home from the hospital or clinic.  If you will be going home right after the procedure, plan to have a responsible adult care for you for the time you are told. This is important. What happens during the procedure?  An IV will be inserted into one of your veins.  You will be given one or more of the following: ? A medicine to help you relax (sedative). ? A medicine to numb the area (local anesthetic).  You will be asked to lie on your abdomen or sit.  The injection site will be cleaned.  A needle will be inserted through your skin into the epidural space. This may cause you some discomfort. An X-ray machine will be used to guide  the needle as close as possible to the affected nerve.  A steroid medicine and a local anesthetic will be injected into the epidural space.  The needle and IV will be removed.  A bandage (dressing) will be put over the injection site. The procedure may vary among health care providers and hospitals. What can I expect after the procedure?  Your blood pressure, heart rate, breathing rate, and blood oxygen level will be monitored  until you leave the hospital or clinic.  Your arm or leg may feel weak or numb for a few hours.  The injection site may feel sore. Follow these instructions at home: Injection site care  You may remove the bandage (dressing) after 24 hours.  Check your injection site every day for signs of infection. Check for: ? Redness, swelling, or pain. ? Fluid or blood. ? Warmth. ? Pus or a bad smell. Managing pain, stiffness, and swelling  For 24 hours after the procedure: ? Avoid using heat on the injection site. ? Do not take baths, swim, or use a hot tub until your health care provider approves. Ask your health care provider if you may take showers. You may only be allowed to take sponge baths.   If directed, put ice on the injection site. To do this: ? Put ice in a plastic bag. ? Place a towel between your skin and the bag. ? Leave the ice on for 20 minutes, 2-3 times a day.   Activity  If you were given a sedative during the procedure, it can affect you for several hours. Do not drive or operate machinery until your health care provider says that it is safe.  Return to your normal activities as told by your health care provider. Ask your health care provider what activities are safe for you. General instructions  Take over-the-counter and prescription medicines only as told by your health care provider.  Drink enough fluid to keep your urine pale yellow.  Keep all follow-up visits as told by your health care provider. This is important. Contact a health care provider if:  You have any of these signs of infection: ? Redness, swelling, or pain around your injection site. ? Fluid or blood coming from your injection site. ? Warmth coming from your injection site. ? Pus or a bad smell coming from your injection site. ? A fever.  You continue to have pain and soreness around the injection site, even after taking over-the-counter pain medicine.  You have severe, sudden, or lasting  nausea or vomiting. Get help right away if:  You have severe pain at the injection site that is not relieved by medicines.  You develop a severe headache or a stiff neck.  You become sensitive to light.  You have any new numbness or weakness in your legs or arms.  You lose control of your bladder or bowel movements.  You have trouble breathing. Summary  An epidural steroid injection is a shot of steroid medicine and numbing medicine that is given into the epidural space.  The shot helps relieve pain caused by an irritated or swollen nerve root.  You are more likely to benefit from this injection if your pain is strong and comes on suddenly rather than if you have had chronic pain. This information is not intended to replace advice given to you by your health care provider. Make sure you discuss any questions you have with your health care provider. Document Revised: 02/03/2020 Document Reviewed: 04/18/2019 Else[employer] Patient Education  [employer].

## 2020-12-20 ENCOUNTER — Other Ambulatory Visit: Payer: Self-pay

## 2020-12-20 ENCOUNTER — Ambulatory Visit
Admission: RE | Admit: 2020-12-20 | Discharge: 2020-12-20 | Disposition: A | Discharge status: Home / Self Care | Payer: Medicare Other | Source: Ambulatory Visit | Attending: Family Medicine | Admitting: Family Medicine

## 2020-12-20 DIAGNOSIS — M47812 Spondylosis without myelopathy or radiculopathy, cervical region: Secondary | ICD-10-CM | POA: Diagnosis not present

## 2020-12-20 DIAGNOSIS — M5412 Radiculopathy, cervical region: Secondary | ICD-10-CM

## 2020-12-20 MED ORDER — TRIAMCINOLONE ACETONIDE 40 MG/ML IJ SUSP (RADIOLOGY)
60.0000 mg | Freq: Once | INTRAMUSCULAR | Status: AC
  Administered 2020-12-20: 60 mg via EPIDURAL

## 2020-12-20 MED ORDER — IOPAMIDOL (ISOVUE-M 300) INJECTION 61%
1.0000 mL | Freq: Once | INTRAMUSCULAR | Status: AC | PRN
  Administered 2020-12-20: 1 mL via EPIDURAL

## 2020-12-20 NOTE — Discharge Instructions (Signed)

## 2020-12-26 MED FILL — CYCLOBENZAPRINE HCL 5 MG TA: 5 | 10 days supply | Qty: 30 | Fill #1

## 2021-01-07 ENCOUNTER — Encounter: Payer: Self-pay | Admitting: Family Medicine

## 2021-01-11 ENCOUNTER — Encounter: Payer: Self-pay | Admitting: Family Medicine

## 2021-01-11 DIAGNOSIS — M5412 Radiculopathy, cervical region: Secondary | ICD-10-CM

## 2021-01-11 DIAGNOSIS — M4802 Spinal stenosis, cervical region: Secondary | ICD-10-CM

## 2021-01-14 ENCOUNTER — Other Ambulatory Visit: Payer: Self-pay | Admitting: Family Medicine

## 2021-01-14 ENCOUNTER — Other Ambulatory Visit: Payer: Self-pay

## 2021-01-14 ENCOUNTER — Ambulatory Visit
Admission: RE | Admit: 2021-01-14 | Discharge: 2021-01-14 | Disposition: A | Discharge status: Home / Self Care | Payer: Medicare Other | Source: Ambulatory Visit | Attending: Family Medicine | Admitting: Family Medicine

## 2021-01-14 DIAGNOSIS — M5412 Radiculopathy, cervical region: Secondary | ICD-10-CM

## 2021-01-14 DIAGNOSIS — M4802 Spinal stenosis, cervical region: Secondary | ICD-10-CM

## 2021-01-14 DIAGNOSIS — M47812 Spondylosis without myelopathy or radiculopathy, cervical region: Secondary | ICD-10-CM | POA: Diagnosis not present

## 2021-01-14 MED ORDER — TRIAMCINOLONE ACETONIDE 40 MG/ML IJ SUSP (RADIOLOGY)
60.0000 mg | Freq: Once | INTRAMUSCULAR | Status: AC
  Administered 2021-01-14: 60 mg via EPIDURAL

## 2021-01-14 MED ORDER — IOPAMIDOL (ISOVUE-M 300) INJECTION 61%
1.0000 mL | Freq: Once | INTRAMUSCULAR | Status: AC
  Administered 2021-01-14: 1 mL via EPIDURAL

## 2021-01-14 NOTE — Discharge Instructions (Signed)

## 2021-01-28 ENCOUNTER — Encounter: Payer: Self-pay | Admitting: Physical Therapy

## 2021-01-29 NOTE — Therapy (Addendum)
[facility] [address] [place], [place], [address] Phone: [phone]   Fax:  [phone]  Physical Therapy Evaluation  Patient Details  Name: Savannah Morrow MRN: [id number] Date of Birth: [birth year] Referring Provider (PT): Lynne Leader   Encounter Date: 12/10/2020   PT End of Session - 01/28/21 1151     Visit Number 1    Number of Visits 12    Date for PT Re-Evaluation 01/21/21    Authorization Type UHC Medicare    PT Start Time 0800    PT Stop Time 0845    PT Time Calculation (min) 45 min    Activity Tolerance Patient tolerated treatment well;Patient limited by pain    Behavior During Therapy [facility] for tasks assessed/performed             Past Medical History:  Diagnosis Date   Allergy    Arthritis    knees   Crohn's disease ([place])    colon resection in 20. Dr. Carlean Purl unsure of diagnosis- no evidence on colonoscopy   Heart murmur    Hx of adenomatous colonic polyps 03/17/2018   Hyperlipidemia    Hypertension    Uterine cancer ([place]) 1989   s/p hysterectomy    Past Surgical History:  Procedure Laterality Date   APPENDECTOMY     BREAST BIOPSY Left 03/2019   fatty tissue   COLON SURGERY  1978-79   COLONOSCOPY     OOPHORECTOMY     x1   total hysterectomy     WISDOM TOOTH EXTRACTION      There were no vitals filed for this visit.    Subjective Assessment - 01/28/21 1148     Subjective Pt states R knee pain and neck pain. Increased R knee pain about 3 week ago, is doing a bit better after injection 2 weeks ago. Neck pain very bothersome, 2nd episode of pain, also had about 1 year ago. Also has pain/numb/tingling into R UE last year. Has had round of prednisone. Continues to have constant pain, num/tingling into R UE. Is having difficulty raising arm and is in significant amount of pain.    Limitations Lifting;Writing;House hold activities    Patient Stated Goals decreased pain    Currently in Pain? Yes    Pain  Score 6     Pain Location Neck    Pain Orientation Right    Pain Descriptors / Indicators Aching;Shooting    Pain Type Acute pain    Pain Onset More than a month ago    Pain Frequency Intermittent    Aggravating Factors  head movement, arm movement    Pain Score 3    Pain Location Knee    Pain Orientation Right    Pain Descriptors / Indicators Aching    Pain Type Acute pain    Pain Onset More than a month ago    Pain Frequency Intermittent                [facility] PT Assessment - 01/29/21 0001       Assessment   Medical Diagnosis Cervcial Radiculopathy    Referring Provider (PT) Lynne Leader    Hand Dominance Right    Prior Therapy no      Balance Screen   Has the patient fallen in the past 6 months No      Prior Function   Level of Independence Independent      Cognition   Overall Cognitive Status Within Functional Limits for  tasks assessed      ROM / Strength   AROM / PROM / Strength AROM;Strength      AROM   Overall AROM Comments Shoulder flex: 80, Ad: 70 deg;  Cervical: WNL, pain with rotation to R;  Knee: mild limitation for flexion at end range, with soreness      Strength   Overall Strength Comments shoulder: 4-/5, limited due to pain.  Knee: 4/5, Hips: 4/5      Palpation   Palpation comment Significant pain and guarding with attmepts for manual, and PROM for neck and shoulder,      Special Tests   Other special tests difficultt to assess due to pain.      Standardized Balance Assessment   Standardized Balance Assessment --      Berg Balance Test   Sit to Stand --    Transfers --                        Objective measurements completed on examination: See above findings.               PT Education - 01/28/21 1150     Education Details PT POC, Exam findings, Discussion on return to MD if pain continues to be very high    Person(s) Educated Patient    Methods Explanation;Demonstration;Tactile cues;Verbal cues;Handout     Comprehension Verbalized understanding;Returned demonstration;Verbal cues required;Tactile cues required;Need further instruction              PT Short Term Goals - 01/28/21 1153       PT SHORT TERM GOAL #1   Title Pt to report decreased pain to 5/10 with head and arm movment    Time 2    Period Weeks    Status New    Target Date 12/24/20               PT Long Term Goals - 01/28/21 1154       PT LONG TERM GOAL #1   Title Pt to be independent with final HEP    Time 6    Period Weeks    Status New    Target Date 01/21/21      PT LONG TERM GOAL #2   Title Pt to report decreased pain in cervical region and UE to 0-2/10 with cervical ROM and shoulder ROM.    Time 6    Period Weeks    Status New    Target Date 01/21/21      PT LONG TERM GOAL #3   Title Pt to demo full cervical and shoulder AROM to be WNL, to improve abilityfor ADLs and IADLS.    Time 6    Period Weeks    Status New    Target Date 01/21/21                    Plan - 01/28/21 1156     Clinical Impression Statement Pt presents with primary complaint of increased pain in cervical region, into R UE. Pt with significant pain today, with radicular symptoms. She has signficiant difficulty and limitation for ROM of cervical and shoulder, due to pain. Other tests and ROM difficulty to assess due to degree of pain pt is having with positioning. Pt fearful of movment due to significant pain as well. She has symptoms consistent with cervical radiculopathy. She also has mild pain at medial joint line of R knee, and mild limitation for  flexion ROM. Pt to benefit from skilled PT to address pain and deficits. Discussed benefits of PT, but pt in significant pain at this time, discussed return to MD if she feels pain too severe. Pt very tearful throughout session due to pain and apprehension for movment/exam.    Examination-Activity Limitations Reach Overhead;Sleep;Dressing;Carry;Lift     Examination-Participation Restrictions Cleaning;Meal Prep;Community Activity;Shop;Laundry;Driving    Stability/Clinical Decision Making Evolving/Moderate complexity    Clinical Decision Making Moderate    Rehab Potential Fair    PT Frequency 2x / week    PT Duration 6 weeks    PT Treatment/Interventions ADLs/Self Care Home Management;Cryotherapy;Electrical Stimulation;Iontophoresis 73m/ml Dexamethasone;Moist Heat;Traction;Ultrasound;Therapeutic exercise;Therapeutic activities;Functional mobility training;Stair training;Neuromuscular re-education;Patient/family education;Manual techniques;Passive range of motion;Dry needling;Vasopneumatic Device;Spinal Manipulations;Joint Manipulations    Consulted and Agree with Plan of Care Patient             Patient will benefit from skilled therapeutic intervention in order to improve the following deficits and impairments:  Pain,Increased muscle spasms,Decreased mobility,Decreased activity tolerance,Decreased range of motion,Decreased strength,Impaired UE functional use  Visit Diagnosis: Radiculopathy, cervical region  Acute pain of right knee     Problem List Patient Active Problem List   Diagnosis Date Noted   Spinal stenosis in cervical region 12/18/2020   Osteopenia 05/21/2020   Hearing loss 11/11/2019   S/P total hysterectomy 04/14/2018   Hx of adenomatous colonic polyps 03/17/2018   Family history of cerebrovascular accident (CVA) in sister 10/06/2017   Hyperglycemia 04/07/2016   [place]'s disease ([place]    Chest pain 03/30/2014   Morbid obesity ([place] 03/07/2011   Osteoarthritis of both knees 01/10/2010   Edema 01/10/2010   Hyperlipidemia 06/02/2007   Essential hypertension 06/02/2007   Allergic rhinitis 06/02/2007    LLyndee Hensen PT, DPT 11:41 AM  01/29/21    [facility] [address]Phone: [phone]  Fax:  [phone] Name: WLILLIAHNA SCHUBRINGMRN: [id number] of Birth: 509/05/1956 PHYSICAL THERAPY DISCHARGE SUMMARY  Visits from Start of Care:1 Plan: Patient agrees to discharge.  Patient goals were not met. Patient is being discharged due to- not returning since last visit.    LLyndee Hensen PT, DPT 11:07 AM  12/19/21

## 2021-01-29 NOTE — Addendum Note (Signed)
Addended by: Lyndee Hensen on: 01/29/2021 11:43 AM   Modules accepted: Orders

## 2021-02-07 ENCOUNTER — Other Ambulatory Visit (HOSPITAL_COMMUNITY): Payer: Self-pay

## 2021-02-07 MED FILL — Potassium Chloride Microencapsulated Crys ER Tab 10 mEq: ORAL | 90 days supply | Qty: 90 | Fill #0 | Status: AC

## 2021-02-07 MED FILL — Atenolol & Chlorthalidone Tab 50-25 MG: ORAL | 90 days supply | Qty: 90 | Fill #0 | Status: AC

## 2021-02-07 MED FILL — Atorvastatin Calcium Tab 20 MG (Base Equivalent): ORAL | 90 days supply | Qty: 90 | Fill #0 | Status: AC

## 2021-02-11 ENCOUNTER — Encounter: Payer: Self-pay | Admitting: Family Medicine

## 2021-02-11 DIAGNOSIS — M4802 Spinal stenosis, cervical region: Secondary | ICD-10-CM

## 2021-02-11 DIAGNOSIS — M5412 Radiculopathy, cervical region: Secondary | ICD-10-CM

## 2021-02-18 DIAGNOSIS — M50223 Other cervical disc displacement at C6-C7 level: Secondary | ICD-10-CM | POA: Diagnosis not present

## 2021-02-18 DIAGNOSIS — M50222 Other cervical disc displacement at C5-C6 level: Secondary | ICD-10-CM | POA: Diagnosis not present

## 2021-03-01 ENCOUNTER — Other Ambulatory Visit (HOSPITAL_COMMUNITY): Payer: Self-pay

## 2021-03-01 DIAGNOSIS — M2578 Osteophyte, vertebrae: Secondary | ICD-10-CM | POA: Diagnosis not present

## 2021-03-01 DIAGNOSIS — M50222 Other cervical disc displacement at C5-C6 level: Secondary | ICD-10-CM | POA: Diagnosis not present

## 2021-03-01 MED ORDER — OXYCODONE-ACETAMINOPHEN 5-325 MG PO TABS
1.0000 | ORAL_TABLET | Freq: Four times a day (QID) | ORAL | 0 refills | Status: DC | PRN
  Filled 2021-03-01: qty 28, 7d supply, fill #0

## 2021-03-01 MED ORDER — DIAZEPAM 5 MG PO TABS
5.0000 mg | ORAL_TABLET | Freq: Four times a day (QID) | ORAL | 0 refills | Status: DC | PRN
  Filled 2021-03-01: qty 30, 8d supply, fill #0

## 2021-03-02 ENCOUNTER — Other Ambulatory Visit (HOSPITAL_COMMUNITY): Payer: Self-pay

## 2021-03-02 MED ORDER — HYDROCODONE-ACETAMINOPHEN 5-325 MG PO TABS
1.0000 | ORAL_TABLET | Freq: Four times a day (QID) | ORAL | 0 refills | Status: DC | PRN
  Filled 2021-03-02: qty 30, 7d supply, fill #0

## 2021-03-04 ENCOUNTER — Other Ambulatory Visit (HOSPITAL_COMMUNITY): Payer: Self-pay

## 2021-03-21 DIAGNOSIS — M50222 Other cervical disc displacement at C5-C6 level: Secondary | ICD-10-CM | POA: Diagnosis not present

## 2021-04-01 ENCOUNTER — Telehealth: Payer: Self-pay | Admitting: Family Medicine

## 2021-04-01 NOTE — Progress Notes (Signed)
  Chronic Care Management   Outreach Note  04/01/2021 Name: ADELAE YODICE MRN: [id number] DOB: 26-Jul-1956  Referred by: Marin Olp, MD Reason for referral : No chief complaint on file.   An unsuccessful telephone outreach was attempted today. The patient was referred to the pharmacist for assistance with care management and care coordination.   Follow Up Plan:   Lauretta Grill Upstream Scheduler

## 2021-04-03 ENCOUNTER — Telehealth: Payer: Self-pay | Admitting: Family Medicine

## 2021-04-03 NOTE — Progress Notes (Signed)
  Chronic Care Management   Note  04/03/2021 Name: Savannah Morrow MRN: [id number] DOB: Mar 07, 1956  Savannah Morrow is a 65 y.o. year old female who is a primary care patient of Marin Olp, MD. I reached out to Bobbe Medico by phone today in response to a referral sent by Savannah Morrow's PCP, Yong Channel Brayton Mars, MD.   Savannah Morrow was given information about Chronic Care Management services today including:  CCM service includes personalized support from designated clinical staff supervised by her physician, including individualized plan of care and coordination with other care providers 24/7 contact phone numbers for assistance for urgent and routine care needs. Service will only be billed when office clinical staff spend 20 minutes or more in a month to coordinate care. Only one practitioner may furnish and bill the service in a calendar month. The patient may stop CCM services at any time (effective at the end of the month) by phone call to the office staff.   Patient did not agree to enrollment in care management services and does not wish to consider at this time.  Follow up plan:   Lauretta Grill Upstream Scheduler

## 2021-05-15 ENCOUNTER — Other Ambulatory Visit: Payer: Self-pay | Admitting: Family Medicine

## 2021-05-15 ENCOUNTER — Encounter: Payer: Self-pay | Admitting: Family Medicine

## 2021-05-15 ENCOUNTER — Other Ambulatory Visit (HOSPITAL_COMMUNITY): Payer: Self-pay

## 2021-05-15 MED ORDER — ATENOLOL-CHLORTHALIDONE 50-25 MG PO TABS
1.0000 | ORAL_TABLET | Freq: Every day | ORAL | 3 refills | Status: DC
  Filled 2021-05-15: qty 78, 78d supply, fill #0
  Filled 2021-05-15: qty 12, 12d supply, fill #0
  Filled 2021-06-10 – 2021-08-15 (×2): qty 90, 90d supply, fill #1
  Filled 2021-11-12: qty 90, 90d supply, fill #2
  Filled 2022-02-10: qty 90, 90d supply, fill #3

## 2021-05-15 MED ORDER — POTASSIUM CHLORIDE CRYS ER 10 MEQ PO TBCR
10.0000 meq | EXTENDED_RELEASE_TABLET | Freq: Every day | ORAL | 3 refills | Status: DC
  Filled 2021-05-15: qty 90, 90d supply, fill #0

## 2021-05-15 MED ORDER — ATORVASTATIN CALCIUM 20 MG PO TABS
20.0000 mg | ORAL_TABLET | Freq: Every day | ORAL | 3 refills | Status: DC
  Filled 2021-05-15: qty 53, 53d supply, fill #0
  Filled 2021-05-15: qty 37, 37d supply, fill #0
  Filled 2021-08-15: qty 90, 90d supply, fill #1
  Filled 2021-11-12: qty 90, 90d supply, fill #2
  Filled 2022-02-10: qty 90, 90d supply, fill #3

## 2021-05-23 ENCOUNTER — Ambulatory Visit (INDEPENDENT_AMBULATORY_CARE_PROVIDER_SITE_OTHER): Payer: Medicare Other

## 2021-05-23 DIAGNOSIS — Z0000 Encounter for general adult medical examination without abnormal findings: Secondary | ICD-10-CM

## 2021-05-23 NOTE — Progress Notes (Signed)
Virtual Visit via Telephone Note  I connected with  Savannah Morrow on 05/23/21 at  1:45 PM EDT by telephone and verified that I am speaking with the correct person using two identifiers.  Medicare Annual Wellness visit completed telephonically due to Covid-19 pandemic.   Persons participating in this call: This Health Coach and this patient.   Location: Patient: Home Provider: Office   I discussed the limitations, risks, security and privacy concerns of performing an evaluation and management service by telephone and the availability of in person appointments. The patient expressed understanding and agreed to proceed.  Unable to perform video visit due to video visit attempted and failed and/or patient does not have video capability.   Some vital signs may be absent or patient reported.   Willette Brace, LPN   Subjective:   Savannah Morrow is a 65 y.o. female who presents for Medicare Annual (Subsequent) preventive examination.  Review of Systems      Cardiac Risk Factors include: advanced age (>67mn, >>56women);hypertension;dyslipidemia;obesity (BMI >30kg/m2)     Objective:    Today's Vitals   05/23/21 1343  PainSc: 2    There is no height or weight on file to calculate BMI.  Advanced Directives 05/23/2021 01/28/2021 05/17/2020  Does Patient Have a Medical Advance Directive? Yes No Yes  Type of Advance Directive Living will;Healthcare Power of [place] will  Copy of [place]? Yes - validated most recent copy scanned in chart (See row information) - No - copy requested  Would patient like information on creating a medical advance directive? - No - Patient declined -    Current Medications (verified) Outpatient Encounter Medications as of 05/23/2021  Medication Sig   acetaminophen (TYLENOL) 650 MG CR tablet Take 1,300 mg by mouth every 8 (eight) hours as needed for pain. Takes sparingly for her knees OTC    atenolol-chlorthalidone (TENORETIC) 50-25 MG tablet Take 1 tablet by mouth daily.   atorvastatin (LIPITOR) 20 MG tablet Take 1 tablet (20 mg total) by mouth daily.   Cholecalciferol (VITAMIN D3) 50 MCG (2000 UT) capsule Take by mouth daily.   fluticasone (FLONASE) 50 MCG/ACT nasal spray Place 1 spray into both nostrils daily.   loratadine (CLARITIN) 10 MG tablet Take 10 mg by mouth daily as needed.   potassium chloride (KLOR-CON) 10 MEQ tablet Take 1 tablet (10 mEq total) by mouth daily.   MODERNA COVID-19 VACCINE 100 MCG/0.5ML injection    [DISCONTINUED] cyclobenzaprine (FLEXERIL) 5 MG tablet TAKE 1 TABLET (5 MG TOTAL) BY MOUTH 3 (THREE) TIMES DAILY AS NEEDED FOR MUSCLE SPASMS (DO NOT DRIVE FOR 6 HOURS AFTER USE). (Patient not taking: Reported on 12/18/2020)   [DISCONTINUED] diazepam (VALIUM) 5 MG tablet Take 1 tablet (5 mg total) by mouth every 6 (six) hours as needed for muscle spasms in neck.   [DISCONTINUED] gabapentin (NEURONTIN) 100 MG capsule TAKE 1 TO 3 CAPSULES THREE TIMES DAILY AS NEEDED FOR NERVE PAIN.   [DISCONTINUED] HYDROcodone-acetaminophen (NORCO/VICODIN) 5-325 MG tablet Take 1 tablet by mouth every 6 (six) hours as needed for pain.   No facility-administered encounter medications on file as of 05/23/2021.    Allergies (verified) Meperidine hcl, Morphine, Percocet [oxycodone-acetaminophen], Sulfamethoxazole, Trimethoprim, and Sulfamethoxazole-trimethoprim   History: Past Medical History:  Diagnosis Date   Allergy    Arthritis    knees   [place]'s [place]    colon resection in 20. Dr. GCarlean Purlunsure of diagnosis- no evidence  on colonoscopy   Heart murmur    Hx of adenomatous colonic polyps 03/17/2018   Hyperlipidemia    Hypertension    Uterine cancer ([place]) 1989   s/p hysterectomy   Past Surgical History:  Procedure Laterality Date   APPENDECTOMY     BREAST BIOPSY Left 03/2019   fatty tissue   COLON SURGERY  1978-79   COLONOSCOPY     OOPHORECTOMY     x1   total  hysterectomy     WISDOM TOOTH EXTRACTION     Family History  Problem Relation Age of Onset   Uterine cancer Mother    Alzheimer's disease Mother        died 2019-12-02  Diabetes Mother    Heart disease Father    Hypertension Father    Cancer Father        b cell lymphoma   CVA Father        passed from stroke 41   AVM Sister        48 years younger. brain. significant in hospital 6 weeks. Ltach/trach feeding tube   Breast cancer Maternal Grandmother    Diabetes Brother    Colon cancer Neg Hx    Esophageal cancer Neg Hx    Stomach cancer Neg Hx    Social History   Socioeconomic History   Marital status: Married    Spouse name: Not on file   Number of children: Not on file   Years of education: Not on file   Highest education level: Not on file  Occupational History   Occupation: retired  Tobacco Use   Smoking status: Never   Smokeless tobacco: Never  Vaping Use   Vaping Use: Never used  Substance and Sexual Activity   Alcohol use: No   Drug use: No   Sexual activity: Yes  Other Topics Concern   Not on file  Social History Narrative   Married 47 years in 2020. 3 children. 7 grandchildren. 4 greatgrandchildren.       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, commissioning work 2010, last worked with [employer]      Hobbies: working in garden, camping in [place] Strain: Low Risk    Difficulty of Paying Living Expenses: Not hard at [place]: No Food Insecurity   Worried About Charity fundraiser in the Last Year: Never true   Arboriculturist in the Last Year: Never true  Transportation Needs: No Transportation Needs   Lack of Transportation (Medical): No   Lack of Transportation (Non-Medical): No  Physical Activity: Sufficiently Active   Days of Exercise per Week: 5 days   Minutes of Exercise per Session: 60 min  Stress: No Stress Concern Present   Feeling of Stress : Not at all  Social Connections: Socially  Integrated   Frequency of Communication with Friends and Family: More than three times a week   Frequency of Social Gatherings with Friends and Family: More than three times a week   Attends Religious Services: More than 4 times per year   Active Member of [employer] or Organizations: Yes   Attends Archivist Meetings: 1 to 4 times per year   Marital Status: Married    Tobacco Counseling Counseling given: Not Answered   Clinical Intake:  Pre-visit preparation completed: Yes  Pain : 0-10 Pain Score: 2  Pain Type: Acute pain Pain Location: Neck Pain Descriptors / Indicators: Dull  Pain Onset: More than a month ago     BMI - recorded: 38.98 Nutritional Status: BMI > 30  Obese Nutritional Risks: None Diabetes: No  How often do you need to have someone help you when you read instructions, pamphlets, or other written materials from your doctor or pharmacy?: 1 - Never  Diabetic?No  Interpreter Needed?: No  Information entered by :: Charlott Rakes, LPN   Activities of Daily Living In your present state of health, do you have any difficulty performing the following activities: 05/23/2021  Hearing? Y  Comment left ear hearing aid  Vision? N  Difficulty concentrating or making decisions? N  Walking or climbing stairs? N  Dressing or bathing? N  Doing errands, shopping? N  Preparing Food and eating ? N  Using the Toilet? N  In the past six months, have you accidently leaked urine? Y  Comment wears  a pad  Do you have problems with loss of bowel control? N  Managing your Medications? N  Managing your Finances? N  Housekeeping or managing your Housekeeping? N  Some recent data might be hidden    Patient Care Team: Marin Olp, MD as PCP - General (Family Medicine)  Indicate any recent Medical Services you may have received from other than [facility] providers in the past year (date may be approximate).     Assessment:   This is a routine wellness examination for  [place].  Hearing/Vision screen Hearing Screening - Comments:: Pt wears  hearing aid in left ear  Vision Screening - Comments:: Pt follows up with [facility] for annual eye exams   Dietary issues and exercise activities discussed: Current Exercise Habits: Home exercise routine, Type of exercise: walking, Time (Minutes): > 60, Frequency (Times/Week): 5, Weekly Exercise (Minutes/Week): 0   Goals Addressed             This Visit's Progress    Patient Stated       Lose weight        Depression Screen PHQ 2/9 Scores 05/23/2021 11/12/2020 05/21/2020 05/17/2020 05/11/2019 11/09/2018 10/06/2017  PHQ - 2 Score 0 0 0 0 0 0 0  PHQ- 9 Score - - 0 - - - -    Fall Risk Fall Risk  05/23/2021 05/21/2020 05/17/2020 05/11/2019 10/06/2017  Falls in the past year? 0 0 0 0 No  Number falls in past yr: 0 0 0 0 -  Injury with Fall? 0 0 0 0 -  Risk for fall due to : Impaired vision - Impaired vision - -  Follow up Falls prevention discussed - Falls prevention discussed - -    FALL RISK PREVENTION PERTAINING TO THE HOME:  Any stairs in or around the home? Yes  If so, are there any without handrails? No  Home free of loose throw rugs in walkways, pet beds, electrical cords, etc? Yes  Adequate lighting in your home to reduce risk of falls? Yes   ASSISTIVE DEVICES UTILIZED TO PREVENT FALLS:  Life alert? No  Use of a cane, walker or w/c? No  Grab bars in the bathroom? Yes  Shower chair or bench in shower? Yes  Elevated toilet seat or a handicapped toilet? No   TIMED UP AND GO:  Was the test performed? No     Cognitive Function:     6CIT Screen 05/23/2021 05/17/2020  What Year? 0 points 0 points  What month? 0 points 0 points  What time? 0 points 0 points  Count back from  20 0 points 0 points  Months in reverse 0 points 0 points  Repeat phrase 0 points 2 points  Total Score 0 2    Immunizations Immunization History  Administered Date(s) Administered   Fluad Quad(high Dose 65+) 07/25/2019    Influenza Split 07/21/2011, 08/06/2012   Influenza Whole 08/24/2009   Influenza, High Dose Seasonal PF 07/24/2020   Influenza, Seasonal, Injecte, Preservative Fre 07/18/2015   Influenza,inj,Quad PF,6+ Mos 07/27/2013   Influenza-Unspecified 07/27/2014, 08/01/2016, 07/27/2017, 08/09/2018, 07/25/2019   Moderna Sars-Covid-2 Vaccination 01/10/2020, 02/07/2020, 11/27/2020   Pneumococcal Conjugate-13 05/21/2020   Pneumococcal Polysaccharide-23 05/11/2019   Td 10/20/2005   Tdap 04/07/2016   Zoster Recombinat (Shingrix) 04/27/2018, 07/28/2018   Zoster, Live 03/15/2014    TDAP status: Up to date  Flu Vaccine status: Due, Education has been provided regarding the importance of this vaccine. Advised may receive this vaccine at local pharmacy or Health Dept. Aware to provide a copy of the vaccination record if obtained from local pharmacy or Health Dept. Verbalized acceptance and understanding.  Pneumococcal vaccine status: Up to date  Covid-19 vaccine status: Completed vaccines  Qualifies for Shingles Vaccine? Yes   Zostavax completed Yes   Shingrix Completed?: Yes  Screening Tests Health Maintenance  Topic Date Due   COVID-19 Vaccine (4 - Booster for Moderna series) 03/27/2021   INFLUENZA VACCINE  05/20/2021   MAMMOGRAM  05/29/2022   COLONOSCOPY (Pts 45-23yr Insurance coverage will need to be confirmed)  03/10/2023   TETANUS/TDAP  04/07/2026   DEXA SCAN  Completed   Hepatitis C Screening  Completed   PNA vac Low Risk Adult  Completed   Zoster Vaccines- Shingrix  Completed   HPV VACCINES  Aged Out    Health Maintenance  Health Maintenance Due  Topic Date Due   COVID-19 Vaccine (4 - Booster for Moderna series) 03/27/2021   INFLUENZA VACCINE  05/20/2021    Colorectal cancer screening: Type of screening: Colonoscopy. Completed 03/09/18. Repeat every 5 years  Mammogram status: Completed 05/29/20. Repeat every year  Bone Density status: Completed 06/02/19. Results reflect: Bone  density results: OSTEOPENIA. Repeat every 2 years.   Additional Screening:  Hepatitis C Screening:  Completed 04/07/16  Vision Screening: Recommended annual ophthalmology exams for early detection of glaucoma and other disorders of the eye. Is the patient up to date with their annual eye exam?  Yes  Who is the provider or what is the name of the office in which the patient attends annual eye exams? [facility] If pt is not established with a provider, would they like to be referred to a provider to establish care? No .   Dental Screening: Recommended annual dental exams for proper oral hygiene  Community Resource Referral / Chronic Care Management: CRR required this visit?  No   CCM required this visit?  No      Plan:     I have personally reviewed and noted the following in the patient's chart:   Medical and social history Use of alcohol, tobacco or illicit drugs  Current medications and supplements including opioid prescriptions.  Functional ability and status Nutritional status Physical activity Advanced directives List of other physicians Hospitalizations, surgeries, and ER visits in previous 12 months Vitals Screenings to include cognitive, depression, and falls Referrals and appointments  In addition, I have reviewed and discussed with patient certain preventive protocols, quality metrics, and best practice recommendations. A written personalized care plan for preventive services as well as general preventive health recommendations were provided to patient.  Willette Brace, LPN   11/20/5156   Nurse Notes: Pt is requesting prescription for 20/30 knee high compression stocking, she has an appt 05/28/21 with you.

## 2021-05-23 NOTE — Patient Instructions (Signed)
Savannah Morrow , Thank you for taking time to come for your Medicare Wellness Visit. I appreciate your ongoing commitment to your health goals. Please review the following plan we discussed and let me know if I can assist you in the future.   Screening recommendations/referrals: Colonoscopy: Done 03/09/18 repeat in 5 years  Mammogram: Done 05/29/20 repeat every year  Bone Density: Done 06/02/19 repeat every 2 years  Recommended yearly ophthalmology/optometry visit for glaucoma screening and checkup Recommended yearly dental visit for hygiene and checkup  Vaccinations: Influenza vaccine: Due in season  Pneumococcal vaccine: Completed  Tdap vaccine: Done 04/07/16 repeat in 10 years due 04/07/26 Shingles vaccine: Completed 7/9 & 07/28/18   Covid-19:Completed 3/23, 4/20, & 11/27/20  Advanced directives: Please bring a copy of your health care power of attorney and living will to the office at your convenience.  Conditions/risks identified: lose weight   Next appointment: Follow up in one year for your annual wellness visit    Preventive Care 65 Years and Older, Female Preventive care refers to lifestyle choices and visits with your health care provider that can promote health and wellness. What does preventive care include? A yearly physical exam. This is also called an annual well check. Dental exams once or twice a year. Routine eye exams. Ask your health care provider how often you should have your eyes checked. Personal lifestyle choices, including: Daily care of your teeth and gums. Regular physical activity. Eating a healthy diet. Avoiding tobacco and drug use. Limiting alcohol use. Practicing safe sex. Taking low-dose aspirin every day. Taking vitamin and mineral supplements as recommended by your health care provider. What happens during an annual well check? The services and screenings done by your health care provider during your annual well check will depend on your age, overall  health, lifestyle risk factors, and family history of disease. Counseling  Your health care provider may ask you questions about your: Alcohol use. Tobacco use. Drug use. Emotional well-being. Home and relationship well-being. Sexual activity. Eating habits. History of falls. Memory and ability to understand (cognition). Work and work Statistician. Reproductive health. Screening  You may have the following tests or measurements: Height, weight, and BMI. Blood pressure. Lipid and cholesterol levels. These may be checked every 5 years, or more frequently if you are over 22 years old. Skin check. Lung cancer screening. You may have this screening every year starting at age 16 if you have a 30-pack-year history of smoking and currently smoke or have quit within the past 15 years. Fecal occult blood test (FOBT) of the stool. You may have this test every year starting at age 26. Flexible sigmoidoscopy or colonoscopy. You may have a sigmoidoscopy every 5 years or a colonoscopy every 10 years starting at age 37. Hepatitis C blood test. Hepatitis B blood test. Sexually transmitted disease (STD) testing. Diabetes screening. This is done by checking your blood sugar (glucose) after you have not eaten for a while (fasting). You may have this done every 1-3 years. Bone density scan. This is done to screen for osteoporosis. You may have this done starting at age 71. Mammogram. This may be done every 1-2 years. Talk to your health care provider about how often you should have regular mammograms. Talk with your health care provider about your test results, treatment options, and if necessary, the need for more tests. Vaccines  Your health care provider may recommend certain vaccines, such as: Influenza vaccine. This is recommended every year. Tetanus, diphtheria, and acellular pertussis (  Tdap, Td) vaccine. You may need a Td booster every 10 years. Zoster vaccine. You may need this after age  16. Pneumococcal 13-valent conjugate (PCV13) vaccine. One dose is recommended after age 65. Pneumococcal polysaccharide (PPSV23) vaccine. One dose is recommended after age 66. Talk to your health care provider about which screenings and vaccines you need and how often you need them. This information is not intended to replace advice given to you by your health care provider. Make sure you discuss any questions you have with your health care provider. Document Released: 11/02/2015 Document Revised: 06/25/2016 Document Reviewed: 08/07/2015 Elsevier Interactive Patient Education  2017 [employer] Prevention in the Home Falls can cause injuries. They can happen to people of all ages. There are many things you can do to make your home safe and to help prevent falls. What can I do on the outside of my home? Regularly fix the edges of walkways and driveways and fix any cracks. Remove anything that might make you trip as you walk through a door, such as a raised step or threshold. Trim any bushes or trees on the path to your home. Use bright outdoor lighting. Clear any walking paths of anything that might make someone trip, such as rocks or tools. Regularly check to see if handrails are loose or broken. Make sure that both sides of any steps have handrails. Any raised decks and porches should have guardrails on the edges. Have any leaves, snow, or ice cleared regularly. Use sand or salt on walking paths during winter. Clean up any spills in your garage right away. This includes oil or grease spills. What can I do in the bathroom? Use night lights. Install grab bars by the toilet and in the tub and shower. Do not use towel bars as grab bars. Use non-skid mats or decals in the tub or shower. If you need to sit down in the shower, use a plastic, non-slip stool. Keep the floor dry. Clean up any water that spills on the floor as soon as it happens. Remove soap buildup in the tub or shower  regularly. Attach bath mats securely with double-sided non-slip rug tape. Do not have throw rugs and other things on the floor that can make you trip. What can I do in the bedroom? Use night lights. Make sure that you have a light by your bed that is easy to reach. Do not use any sheets or blankets that are too big for your bed. They should not hang down onto the floor. Have a firm chair that has side arms. You can use this for support while you get dressed. Do not have throw rugs and other things on the floor that can make you trip. What can I do in the kitchen? Clean up any spills right away. Avoid walking on wet floors. Keep items that you use a lot in easy-to-reach places. If you need to reach something above you, use a strong step stool that has a grab bar. Keep electrical cords out of the way. Do not use floor polish or wax that makes floors slippery. If you must use wax, use non-skid floor wax. Do not have throw rugs and other things on the floor that can make you trip. What can I do with my stairs? Do not leave any items on the stairs. Make sure that there are handrails on both sides of the stairs and use them. Fix handrails that are broken or loose. Make sure that handrails are  as long as the stairways. Check any carpeting to make sure that it is firmly attached to the stairs. Fix any carpet that is loose or worn. Avoid having throw rugs at the top or bottom of the stairs. If you do have throw rugs, attach them to the floor with carpet tape. Make sure that you have a light switch at the top of the stairs and the bottom of the stairs. If you do not have them, ask someone to add them for you. What else can I do to help prevent falls? Wear shoes that: Do not have high heels. Have rubber bottoms. Are comfortable and fit you well. Are closed at the toe. Do not wear sandals. If you use a stepladder: Make sure that it is fully opened. Do not climb a closed stepladder. Make sure that  both sides of the stepladder are locked into place. Ask someone to hold it for you, if possible. Clearly mark and make sure that you can see: Any grab bars or handrails. First and last steps. Where the edge of each step is. Use tools that help you move around (mobility aids) if they are needed. These include: Canes. Walkers. Scooters. Crutches. Turn on the lights when you go into a dark area. Replace any light bulbs as soon as they burn out. Set up your furniture so you have a clear path. Avoid moving your furniture around. If any of your floors are uneven, fix them. If there are any pets around you, be aware of where they are. Review your medicines with your doctor. Some medicines can make you feel dizzy. This can increase your chance of falling. Ask your doctor what other things that you can do to help prevent falls. This information is not intended to replace advice given to you by your health care provider. Make sure you discuss any questions you have with your health care provider. Document Released: 08/02/2009 Document Revised: 03/13/2016 Document Reviewed: 11/10/2014 Elsevier Interactive Patient Education  2017 [employer].

## 2021-05-28 ENCOUNTER — Ambulatory Visit (INDEPENDENT_AMBULATORY_CARE_PROVIDER_SITE_OTHER): Payer: Medicare Other | Admitting: Family Medicine

## 2021-05-28 ENCOUNTER — Other Ambulatory Visit: Payer: Self-pay

## 2021-05-28 ENCOUNTER — Encounter: Payer: Self-pay | Admitting: Family Medicine

## 2021-05-28 VITALS — BP 129/73 | HR 70 | Temp 98.1°F | Ht 63.0 in | Wt 224.2 lb

## 2021-05-28 DIAGNOSIS — I10 Essential (primary) hypertension: Secondary | ICD-10-CM | POA: Diagnosis not present

## 2021-05-28 DIAGNOSIS — R739 Hyperglycemia, unspecified: Secondary | ICD-10-CM

## 2021-05-28 DIAGNOSIS — Z0000 Encounter for general adult medical examination without abnormal findings: Secondary | ICD-10-CM

## 2021-05-28 DIAGNOSIS — K50919 Crohn's disease, unspecified, with unspecified complications: Secondary | ICD-10-CM

## 2021-05-28 DIAGNOSIS — E785 Hyperlipidemia, unspecified: Secondary | ICD-10-CM

## 2021-05-28 LAB — CBC WITH DIFFERENTIAL/PLATELET
Basophils Absolute: 0 10*3/uL (ref 0.0–0.1)
Basophils Relative: 0.5 % (ref 0.0–3.0)
Eosinophils Absolute: 0.1 10*3/uL (ref 0.0–0.7)
Eosinophils Relative: 1.5 % (ref 0.0–5.0)
HCT: 38.6 % (ref 36.0–46.0)
Hemoglobin: 12.7 g/dL (ref 12.0–15.0)
Lymphocytes Relative: 16 % (ref 12.0–46.0)
Lymphs Abs: 1.1 10*3/uL (ref 0.7–4.0)
MCHC: 33 g/dL (ref 30.0–36.0)
MCV: 89.2 fl (ref 78.0–100.0)
Monocytes Absolute: 0.5 10*3/uL (ref 0.1–1.0)
Monocytes Relative: 7.9 % (ref 3.0–12.0)
Neutro Abs: 5 10*3/uL (ref 1.4–7.7)
Neutrophils Relative %: 74.1 % (ref 43.0–77.0)
Platelets: 259 10*3/uL (ref 150.0–400.0)
RBC: 4.33 Mil/uL (ref 3.87–5.11)
RDW: 13 % (ref 11.5–15.5)
WBC: 6.8 10*3/uL (ref 4.0–10.5)

## 2021-05-28 LAB — LIPID PANEL
Cholesterol: 124 mg/dL (ref 0–200)
HDL: 43.1 mg/dL (ref >39.00)
LDL Cholesterol: 57 mg/dL (ref 0–99)
NonHDL: 80.92
Total CHOL/HDL Ratio: 3
Triglycerides: 119 mg/dL (ref 0.0–149.0)
VLDL: 23.8 mg/dL (ref 0.0–40.0)

## 2021-05-28 LAB — COMPREHENSIVE METABOLIC PANEL
ALT: 14 U/L (ref 0–35)
AST: 16 U/L (ref 0–37)
Albumin: 4.2 g/dL (ref 3.5–5.2)
Alkaline Phosphatase: 74 U/L (ref 39–117)
BUN: 13 mg/dL (ref 6–23)
CO2: 32 mEq/L (ref 19–32)
Calcium: 9.8 mg/dL (ref 8.4–10.5)
Chloride: 100 mEq/L (ref 96–112)
Creatinine, Ser: 0.71 mg/dL (ref 0.40–1.20)
GFR: 88.08 mL/min (ref >60.00)
Glucose, Bld: 101 mg/dL — ABNORMAL HIGH (ref 70–99)
Potassium: 4.3 mEq/L (ref 3.5–5.1)
Sodium: 140 mEq/L (ref 135–145)
Total Bilirubin: 0.6 mg/dL (ref 0.2–1.2)
Total Protein: 7.3 g/dL (ref 6.0–8.3)

## 2021-05-28 LAB — HEMOGLOBIN A1C: Hgb A1c MFr Bld: 5.7 % (ref 4.6–6.5)

## 2021-05-28 NOTE — Progress Notes (Signed)
Phone [phone]   Subjective:  Patient presents today for their annual physical. Chief complaint-noted.   See problem oriented charting- ROS- full  review of systems was completed and negative except for: activity level improving now getting further out from surgery  The following were reviewed and entered/updated in epic: Past Medical History:  Diagnosis Date   Allergy    Arthritis    knees   Crohn's disease ([place])    colon resection in 20. Dr. Carlean Purl unsure of diagnosis- no evidence on colonoscopy   Heart murmur    Hx of adenomatous colonic polyps 03/17/2018   Hyperlipidemia    Hypertension    Uterine cancer ([place]) 1989   s/p hysterectomy   Patient Active Problem List   Diagnosis Date Noted   Morbid obesity ([place]) 03/07/2011    Priority: High   Hx of adenomatous colonic polyps 03/17/2018    Priority: Medium   Family history of cerebrovascular accident (CVA) in sister 10/06/2017    Priority: Medium   Hyperglycemia 04/07/2016    Priority: Medium   Crohn's disease ([place])     Priority: Medium   Hyperlipidemia 06/02/2007    Priority: Medium   Essential hypertension 06/02/2007    Priority: Medium   Chest pain 03/30/2014    Priority: Low   Osteoarthritis of both knees 01/10/2010    Priority: Low   Edema 01/10/2010    Priority: Low   Allergic rhinitis 06/02/2007    Priority: Low   Spinal stenosis in cervical region 12/18/2020   Osteopenia 05/21/2020   Hearing loss 11/11/2019   S/P total hysterectomy 04/14/2018   Past Surgical History:  Procedure Laterality Date   APPENDECTOMY     BREAST BIOPSY Left 03/2019   fatty tissue   COLON SURGERY  1978-79   COLONOSCOPY     OOPHORECTOMY     x1   total hysterectomy     WISDOM TOOTH EXTRACTION      Family History  Problem Relation Age of Onset   Uterine cancer Mother    Alzheimer's disease Mother        died 07-Dec-2019  Diabetes Mother    Heart disease Father    Hypertension Father    Cancer Father        b  cell lymphoma   CVA Father        passed from stroke 73   AVM Sister        90 years younger. brain. significant in hospital 6 weeks. Ltach/trach feeding tube   Breast cancer Maternal Grandmother    Diabetes Brother    Colon cancer Neg Hx    Esophageal cancer Neg Hx    Stomach cancer Neg Hx     Medications- reviewed and updated Current Outpatient Medications  Medication Sig Dispense Refill   acetaminophen (TYLENOL) 650 MG CR tablet Take 1,300 mg by mouth every 8 (eight) hours as needed for pain. Takes sparingly for her knees OTC     atenolol-chlorthalidone (TENORETIC) 50-25 MG tablet Take 1 tablet by mouth daily. 90 tablet 3   atorvastatin (LIPITOR) 20 MG tablet Take 1 tablet (20 mg total) by mouth daily. 90 tablet 3   Cholecalciferol (VITAMIN D3) 50 MCG (2000 UT) capsule Take by mouth daily.     fluticasone (FLONASE) 50 MCG/ACT nasal spray Place 1 spray into both nostrils daily. 48 g 3   loratadine (CLARITIN) 10 MG tablet Take 10 mg by mouth daily as needed.     potassium chloride (KLOR-CON) 10  MEQ tablet Take 1 tablet (10 mEq total) by mouth daily. 90 tablet 3   No current facility-administered medications for this visit.    Allergies-reviewed and updated Allergies  Allergen Reactions   Meperidine Hcl Anaphylaxis   Morphine    Percocet [Oxycodone-Acetaminophen]     rash   Sulfamethoxazole    Trimethoprim    Sulfamethoxazole-Trimethoprim Rash    Social History   Social History Narrative   Married 47 years in 2020. 3 children. 7 grandchildren. 4 greatgrandchildren.       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 Oropharynx normal Neck:  no thyromegaly CV: RRR no murmurs rubs or gallops Lungs: CTAB no crackles, wheeze, rhonchi Abdomen: soft/nontender/nondistended/normal bowel sounds. No rebound or guarding.  Ext: no edema Skin: warm, dry Neuro: grossly normal, moves all extremities, PERRLA   Assessment and Plan   65 y.o. female presenting for annual physical.  Health Maintenance counseling: 1. Anticipatory guidance: Patient counseled regarding regular dental exams -q6 months, eye exams - yearly,  avoiding smoking and second hand smoke , limiting alcohol to 1 beverage per day- doesn't drink .   2. Risk factor reduction:  Advised patient of need for regular exercise and diet rich and fruits and vegetables to reduce risk of heart attack and stroke. Exercise- improving steadily after surgery- doing her walking- down from prior 2 miles after surgery ut working towards it. Diet-had some weight gain between oral and injectable steroids with epidurals- has been trying to work on healthy eating despite challenges of steroids. Long term goal 199  Wt Readings from Last 3 Encounters:  05/28/21 224 lb 3.2 oz (101.7 kg)  12/18/20 220 lb (99.8 kg)  12/11/20 217 lb 12.8 oz (98.8 kg)  3. Immunizations/screenings/ancillary studies-plans to get flu shot at local pharmacy-she will let us know when she gets this, COVID-19 vaccination #4- she is considering  Immunization History  Administered Date(s) Administered   Fluad Quad(high Dose 65+) 07/25/2019   Influenza Split 07/21/2011, 08/06/2012   Influenza Whole 08/24/2009   Influenza, High Dose Seasonal PF 07/24/2020   Influenza, Seasonal, Injecte, Preservative Fre 07/18/2015   Influenza,inj,Quad PF,6+ Mos 07/27/2013   Influenza-Unspecified 07/27/2014, 08/01/2016, 07/27/2017, 08/09/2018, 07/25/2019   Moderna Sars-Covid-2 Vaccination 01/10/2020, 02/07/2020, 11/27/2020   Pneumococcal Conjugate-13 05/21/2020   Pneumococcal Polysaccharide-23 05/11/2019   Td 10/20/2005   Tdap 04/07/2016   Zoster  Recombinat (Shingrix) 04/27/2018, 07/28/2018   Zoster, Live 03/15/2014  4. Cervical cancer screening- history of hysterectomy for uterine cancer-removed cervix-no history of cervical cancer.  She is also past age based screening recommendations.  No vaginal/GU complaints-no Pap or pelvic needed at this time 5. Breast cancer screening-  breast exam-prefer self exams-and mammogram - august 2021- will call to schedule 6. Colon cancer screening - adenomatous polyp in 2019 with 5-year follow-up planned Mar 10, 2023 7. Skin cancer screening- no recent visits to derm- last a few years ago. advised regular sunscreen use. Denies worrisome, changing, or new skin lesions.  8. Birth control/STD check- declines is postmenopausal and monogamous. Last LMP 1989.  9. Osteoporosis screening at 65- slight osteopenia at age 67-currently on D3- and doing weight bearing exercise- we will check next year per her preference -Never smoker  Status of chronic or acute concerns   #hypertension S: medication: atenolol-chlorthalidone 50-25Mg. Did not take med yet this AM.  Home readings #s: usually 138/76-80 to low 140s/70-80  BP Readings from Last 3 Encounters:  05/28/21 (!) 142/79  01/14/21 (!) 162/91  12/20/20 (!) 159/81  A/P: Blood pressure is poorly controlled today but she has not had her medicine yet.  Some of her home readings have also been slightly elevated.  I would like for her to go home and monitor blood pressure daily for the next week and update me via MyChart-if her numbers are averaging over 135/85 we discussed potentially restarting chlorthalidone she has been on in the past and has helped her with swelling and blood pressure-last on this in 2018 -still gets a lot of swelling even with compression stockings. Needs new compression stockings 20-30 mmhg for venous insufficiency   #hyperlipidemia S: Medication: Atorvastatin 20Mg-have held short-term in the past due to musculoskeletal concerns -Also  concerned about increasing strength due to prediabetes risk Lab Results  Component Value Date   CHOL 116 05/21/2020   HDL 42 (L) 05/21/2020   LDLCALC 56 05/21/2020   LDLDIRECT 63.0 11/09/2018   TRIG 93 05/21/2020   CHOLHDL 2.8 05/21/2020    A/P:  hopefully stable- update lipid panel today. Continue current meds for now   # Hyperglycemia/insulin resistance/prediabetes- peak a1c of 5.7.   #morbid obesity-peak weight 237-currently 224.  BMI over 35 with hypertension and hyperlipidemia S:  Medication: None Exercise and diet-see discussion above Lab Results  Component Value Date   HGBA1C 5.8 11/21/2020   A/P: has had fair amount of steroids and may be slightly higher- update today  #crohn's disease-  S:  Colon resection in 20s. Dr. Carlean Purl is not certain this is acurate diagnosis. No evidence on repeat colonoscopy. No blood in stool or abdominal cramping recently  A/P: Continue follow-up with GI primarily for routine colonoscopies for polyp history-if she has new symptoms we can certainly refer her back  #history of cervical radiculopathy S: history in 2021 and 2022 of neck/shoulder pain and in 2022 included right arm that responded well to prednisone initially -did have neurosurgery with Dr. Ashok Pall with [facility] may 2022 A/P: patient doing much better after surgery- continue to monitor  #Right knee pain likely osteoarthritis-we did a cortisone injection in February and referred to PT . She didn't have another injection and has done reasonably well- still has issues but voltaren gel helps  Recommended follow up: Return in about 6 months (around 11/28/2021) for follow up- or sooner if needed. Future Appointments  Date Time Provider [place]  06/02/2022  1:45 PM [facility]   Lab/Order associations: fasting   ICD-10-CM   1. Preventative health care  Z00.00 CBC with Differential/Platelet    Comprehensive metabolic panel    Lipid panel     Hemoglobin A1c    2. Hyperlipidemia, unspecified hyperlipidemia type  E78.5 CBC with Differential/Platelet    Comprehensive metabolic panel    Lipid panel    3. Essential hypertension  I10     4. Hyperglycemia  R73.9 Hemoglobin A1c    5. Crohn's disease with complication, unspecified gastrointestinal tract location [facility])  K50.919      Return precautions advised.  Garret Reddish, MD

## 2021-05-28 NOTE — Patient Instructions (Addendum)
Health Maintenance Due  Topic Date Due   COVID-19 Vaccine - she is considering #4 but may wait on new variant specific vaccine 03/27/2021   INFLUENZA VACCINE Will get this at [facility] and let us know when you get this 05/20/2021   Blood pressure is poorly controlled today but she has not had her medicine yet.  Some of her home readings have also been slightly elevated.  I would like for her to go home and monitor blood pressure daily for the next week and update me via MyChart-if her numbers are averaging over 135/85 we discussed potentially restarting chlorthalidone she has been on in the past and has helped her with swelling and blood pressure-last on this in 2018  Jazz please give her a status update on the compression stockings before she goes  Please stop by lab before you go If you have mychart- we will send your results within 3 business days of [place] receiving them.  If you do not have mychart- we will call you about results within 5 business days of [place] receiving them.  *please also note that you will see labs on mychart as soon as they post. I will later go in and write notes on them- will say "notes from Dr. Yong Channel"  Recommended follow up: Return in about 6 months (around 11/28/2021) for follow up- or sooner if needed.

## 2021-06-06 ENCOUNTER — Encounter: Payer: Self-pay | Admitting: Family Medicine

## 2021-06-07 ENCOUNTER — Other Ambulatory Visit: Payer: Self-pay

## 2021-06-07 ENCOUNTER — Other Ambulatory Visit (HOSPITAL_COMMUNITY): Payer: Self-pay

## 2021-06-07 DIAGNOSIS — I10 Essential (primary) hypertension: Secondary | ICD-10-CM

## 2021-06-07 MED ORDER — CHLORTHALIDONE 25 MG PO TABS
25.0000 mg | ORAL_TABLET | Freq: Every day | ORAL | 3 refills | Status: DC
  Filled 2021-06-07: qty 90, 90d supply, fill #0

## 2021-06-10 ENCOUNTER — Other Ambulatory Visit (HOSPITAL_COMMUNITY): Payer: Self-pay

## 2021-06-20 DIAGNOSIS — M50222 Other cervical disc displacement at C5-C6 level: Secondary | ICD-10-CM | POA: Diagnosis not present

## 2021-06-28 ENCOUNTER — Other Ambulatory Visit: Payer: Self-pay

## 2021-06-28 ENCOUNTER — Other Ambulatory Visit (INDEPENDENT_AMBULATORY_CARE_PROVIDER_SITE_OTHER): Payer: Medicare Other

## 2021-06-28 ENCOUNTER — Encounter: Payer: Self-pay | Admitting: Family Medicine

## 2021-06-28 DIAGNOSIS — I10 Essential (primary) hypertension: Secondary | ICD-10-CM | POA: Diagnosis not present

## 2021-06-28 LAB — BASIC METABOLIC PANEL
BUN: 19 mg/dL (ref 6–23)
CO2: 31 mEq/L (ref 19–32)
Calcium: 9.8 mg/dL (ref 8.4–10.5)
Chloride: 101 mEq/L (ref 96–112)
Creatinine, Ser: 0.75 mg/dL (ref 0.40–1.20)
GFR: 82.42 mL/min (ref >60.00)
Glucose, Bld: 96 mg/dL (ref 70–99)
Potassium: 3.6 mEq/L (ref 3.5–5.1)
Sodium: 140 mEq/L (ref 135–145)

## 2021-07-01 MED ORDER — POTASSIUM CHLORIDE CRYS ER 20 MEQ PO TBCR
20.0000 meq | EXTENDED_RELEASE_TABLET | Freq: Every day | ORAL | 3 refills | Status: DC
  Filled 2021-07-01: qty 90, 90d supply, fill #0
  Filled 2021-10-21: qty 90, 90d supply, fill #1
  Filled 2022-01-20: qty 90, 90d supply, fill #2
  Filled 2022-04-17: qty 90, 90d supply, fill #3

## 2021-07-02 ENCOUNTER — Other Ambulatory Visit (HOSPITAL_COMMUNITY): Payer: Self-pay

## 2021-07-25 ENCOUNTER — Other Ambulatory Visit: Payer: Self-pay | Admitting: Family Medicine

## 2021-07-25 ENCOUNTER — Encounter: Payer: Self-pay | Admitting: Family Medicine

## 2021-07-25 DIAGNOSIS — Z1231 Encounter for screening mammogram for malignant neoplasm of breast: Secondary | ICD-10-CM

## 2021-07-30 ENCOUNTER — Ambulatory Visit
Admission: RE | Admit: 2021-07-30 | Discharge: 2021-07-30 | Disposition: A | Discharge status: Home / Self Care | Payer: Medicare Other | Source: Ambulatory Visit | Attending: Family Medicine | Admitting: Family Medicine

## 2021-07-30 ENCOUNTER — Other Ambulatory Visit: Payer: Self-pay

## 2021-07-30 DIAGNOSIS — Z1231 Encounter for screening mammogram for malignant neoplasm of breast: Secondary | ICD-10-CM | POA: Diagnosis not present

## 2021-08-05 ENCOUNTER — Telehealth: Payer: Self-pay

## 2021-08-05 NOTE — Telephone Encounter (Signed)
See below

## 2021-08-05 NOTE — Telephone Encounter (Signed)
Patient states that Dr. Yong Channel added Chlorthalidone 77m to her current BP medication.  Patient states over the weekend she had fatigue, severe headaches and chest pain.    States has not taken the Chlorthalidone yesterday and today.  States she feels better today.  States she has a slight headache and fatigue.  Denies chest pain for this morning.    States BP today is 132/78  Please advise.

## 2021-08-05 NOTE — Telephone Encounter (Signed)
Okay to have her monitor without medication for the next week and then update me with blood pressures-we may need to try 1/2 tablet instead of a full tablet.  If has issues again may need to stop medicine completely.  If she has recurrent chest pain off of medication her on half tablet have her seek care immediately

## 2021-08-06 ENCOUNTER — Encounter: Payer: Self-pay | Admitting: Family Medicine

## 2021-08-06 NOTE — Telephone Encounter (Signed)
Called and spoke with and below message given, pt will update [place] with her readings via mychart next week.

## 2021-08-06 NOTE — Telephone Encounter (Signed)
I have spoken with pt via phone (see prior phone note) and given Dr. Ronney Lion instructions.

## 2021-08-13 ENCOUNTER — Encounter: Payer: Self-pay | Admitting: Family Medicine

## 2021-08-15 ENCOUNTER — Other Ambulatory Visit (HOSPITAL_COMMUNITY): Payer: Self-pay

## 2021-10-21 ENCOUNTER — Other Ambulatory Visit (HOSPITAL_COMMUNITY): Payer: Self-pay

## 2021-11-12 ENCOUNTER — Other Ambulatory Visit (HOSPITAL_COMMUNITY): Payer: Self-pay

## 2021-11-27 NOTE — Progress Notes (Signed)
Phone [phone] In person visit   Subjective:   Savannah Morrow is a 66 y.o. year old very pleasant female patient who presents for/with See problem oriented charting Chief Complaint  Patient presents with   Follow-up   Hyperlipidemia   Hypertension   Hyperglycemia    This visit occurred during the SARS-CoV-2 public health emergency.  Safety protocols were in place, including screening questions prior to the visit, additional usage of staff PPE, and extensive cleaning of exam room while observing appropriate contact time as indicated for disinfecting solutions.   Past Medical History-  Patient Active Problem List   Diagnosis Date Noted   Morbid obesity ([place]) 03/07/2011    Priority: High   Hx of adenomatous colonic polyps 03/17/2018    Priority: Medium    Family history of cerebrovascular accident (CVA) in sister 10/06/2017    Priority: Medium    Hyperglycemia 04/07/2016    Priority: Medium    Crohn's disease ([place])     Priority: Medium    Hyperlipidemia 06/02/2007    Priority: Medium    Essential hypertension 06/02/2007    Priority: Medium    Chest pain 03/30/2014    Priority: Low   Osteoarthritis of both knees 01/10/2010    Priority: Low   Edema 01/10/2010    Priority: Low   Allergic rhinitis 06/02/2007    Priority: Low   Spinal stenosis in cervical region 12/18/2020   Osteopenia 05/21/2020   Hearing loss 11/11/2019   S/P total hysterectomy 04/14/2018    Medications- reviewed and updated Current Outpatient Medications  Medication Sig Dispense Refill   acetaminophen (TYLENOL) 650 MG CR tablet Take 1,300 mg by mouth every 8 (eight) hours as needed for pain. Takes sparingly for her knees OTC     atenolol-chlorthalidone (TENORETIC) 50-25 MG tablet Take 1 tablet by mouth daily. 90 tablet 3   atorvastatin (LIPITOR) 20 MG tablet Take 1 tablet (20 mg total) by mouth daily. 90 tablet 3   Cholecalciferol (VITAMIN D3) 50 MCG (2000 UT) capsule Take by mouth daily.      fluticasone (FLONASE) 50 MCG/ACT nasal spray Place 1 spray into both nostrils daily. 48 g 3   loratadine (CLARITIN) 10 MG tablet Take 10 mg by mouth daily as needed.     potassium chloride SA (KLOR-CON M) 20 MEQ tablet Take 1 tablet (20 mEq total) by mouth daily. 90 tablet 3   No current facility-administered medications for this visit.     Objective:  BP 138/78    Pulse (!) 58    Temp 99.2 F (37.3 C)    Ht 5' 3"  (1.6 m)    Wt 208 lb 12.8 oz (94.7 kg)    LMP  (LMP Unknown)    SpO2 98%    BMI 36.99 kg/m  Gen: NAD, resting comfortably CV: RRR no murmurs rubs or gallops Lungs: CTAB no crackles, wheeze, rhonchi Ext: minimal  edema under compression stockings Skin: warm, dry Neck: some supraclavicular fullness bilateral but stable from baseline (posterior to this slight fullness)- on left side of neck does have some tension in sternocleidomastoid.      Assessment and Plan    #hypertension S: medication: atenolol-chlorthalidone 50-25Mg daily Home readings #s: 128-low 130s/70s BP Readings from Last 3 Encounters:  12/02/21 138/78  05/28/21 129/73  01/14/21 (!) 162/91  A/P: Reasonably well-controlled-we discussed reduced MIs with systolic under 258 but she is dependent at home   #hyperlipidemia S: Medication: Atorvastatin 20Mg daily Lab Results  Component Value  Date   CHOL 124 05/28/2021   HDL 43.10 05/28/2021   LDLCALC 57 05/28/2021   LDLDIRECT 63.0 11/09/2018   TRIG 119.0 05/28/2021   CHOLHDL 3 05/28/2021   A/P: Controlled when checked with LDL under 70-continue current medication  # Hyperglycemia/insulin resistance/prediabetes- peak a1c of 5.7.  #morbid obesity-peak weight 237 S: Medication: None Exercise and diet- patient has made tremendous progress and is down 16 pounds from last visit! Cut out beef but doing a lot of protein and mainly doing fruits/veggies -Being on atorvastatin thus slightly increased risk for diabetes Lab Results  Component Value Date   HGBA1C 5.7  05/28/2021   HGBA1C 5.8 11/21/2020   HGBA1C 5.4 05/21/2020   A/P: Incredible progress on weight-update A1c with labs today   #crohn's disease S: Colon resection in 20s. Dr. Carlean Purl was not certain this is acurate diagnosis. No evidence on repeat colonoscopy.  -No recent abdominal cramping or blood in the stool A/P: Overall stable without evidence of recurrence (and once again question diagnosis in first place) -continue current medication  #history of cervical radiculopathy S: history in 2021 and 2022 of neck/shoulder pain and in 2022 included right arm that responded well to prednisone.  -did have neurosurgery with Dr. Ashok Pall with [facility] may 2022.  Appears to have had a recent visit on 11/28/2021- she reports she was having some swelling in her left neck and posterior headaches- they ordered MRI bran and neck including soft tissues- he did not think cervical -She reports continues to do well today A/P: she did have x-ray and plates and screws in place- appears successful fusion  -has upcoming MRI for neck discomfort and posterior headaches with sister with sister with CVA/bleed from avm.  -consider PT if imaging reassuring  #Right knee pain likely from osteoarthritis-most recent injection February 2022 and refer to physical therapy.  Weight loss is key and she is working on this- she states changing diet has really helped the knee- tries to use antiinflammatory foods.  Voltaren gel helps some but has not needed recently.   #Technically still morbidly obese with BMI over 35 and hypertension and hyperlipidemia but significant improvement-continue current efforts- doing very well!   Recommended follow up: No follow-ups on file. Future Appointments  Date Time Provider [place]  06/02/2022  1:45 PM [facility]    Lab/Order associations:   ICD-10-CM   1. Hyperglycemia  R73.9     2. Essential hypertension  I10     3. Crohn's disease with  complication, unspecified gastrointestinal tract location ([place])  K50.919     4. Hyperlipidemia, unspecified hyperlipidemia type  E78.5      No orders of the defined types were placed in this encounter.  I,Jada Bradford,acting as a scribe for Garret Reddish, MD.,have documented all relevant documentation on the behalf of Garret Reddish, MD,as directed by  Garret Reddish, MD while in the presence of Garret Reddish, MD.   I, Garret Reddish, MD, have reviewed all documentation for this visit. The documentation on 12/02/21 for the exam, diagnosis, procedures, and orders are all accurate and complete.   Return precautions advised.  Garret Reddish, MD

## 2021-11-28 DIAGNOSIS — I10 Essential (primary) hypertension: Secondary | ICD-10-CM | POA: Diagnosis not present

## 2021-11-28 DIAGNOSIS — M50222 Other cervical disc displacement at C5-C6 level: Secondary | ICD-10-CM | POA: Diagnosis not present

## 2021-11-29 ENCOUNTER — Other Ambulatory Visit: Payer: Self-pay | Admitting: Neurosurgery

## 2021-11-29 DIAGNOSIS — Q282 Arteriovenous malformation of cerebral vessels: Secondary | ICD-10-CM

## 2021-11-29 DIAGNOSIS — M50222 Other cervical disc displacement at C5-C6 level: Secondary | ICD-10-CM

## 2021-12-02 ENCOUNTER — Other Ambulatory Visit: Payer: Self-pay

## 2021-12-02 ENCOUNTER — Other Ambulatory Visit: Payer: Self-pay | Admitting: Neurosurgery

## 2021-12-02 ENCOUNTER — Encounter: Payer: Self-pay | Admitting: Family Medicine

## 2021-12-02 ENCOUNTER — Ambulatory Visit (INDEPENDENT_AMBULATORY_CARE_PROVIDER_SITE_OTHER): Payer: Medicare Other | Admitting: Family Medicine

## 2021-12-02 VITALS — BP 138/78 | HR 58 | Temp 99.2°F | Ht 63.0 in | Wt 208.8 lb

## 2021-12-02 DIAGNOSIS — R739 Hyperglycemia, unspecified: Secondary | ICD-10-CM | POA: Diagnosis not present

## 2021-12-02 DIAGNOSIS — I10 Essential (primary) hypertension: Secondary | ICD-10-CM | POA: Diagnosis not present

## 2021-12-02 DIAGNOSIS — M50222 Other cervical disc displacement at C5-C6 level: Secondary | ICD-10-CM

## 2021-12-02 DIAGNOSIS — Q282 Arteriovenous malformation of cerebral vessels: Secondary | ICD-10-CM

## 2021-12-02 DIAGNOSIS — K50919 Crohn's disease, unspecified, with unspecified complications: Secondary | ICD-10-CM | POA: Diagnosis not present

## 2021-12-02 DIAGNOSIS — E785 Hyperlipidemia, unspecified: Secondary | ICD-10-CM | POA: Diagnosis not present

## 2021-12-02 LAB — HEMOGLOBIN A1C: Hgb A1c MFr Bld: 5.6 % (ref 4.6–6.5)

## 2021-12-02 NOTE — Patient Instructions (Addendum)
Prior MRI cervical spine- [facility]  Please stop by lab before you go If you have mychart- we will send your results within 3 business days of [place] receiving them.  If you do not have mychart- we will call you about results within 5 business days of [place] receiving them.  *please also note that you will see labs on mychart as soon as they post. I will later go in and write notes on them- will say "notes from Dr. Yong Channel" g  Recommended follow up: Return in about 6 months (around 06/01/2022) for physical or sooner if needed.

## 2021-12-03 ENCOUNTER — Encounter: Payer: Self-pay | Admitting: Family Medicine

## 2021-12-03 LAB — COMPREHENSIVE METABOLIC PANEL
ALT: 18 U/L (ref 0–35)
AST: 16 U/L (ref 0–37)
Albumin: 4.7 g/dL (ref 3.5–5.2)
Alkaline Phosphatase: 74 U/L (ref 39–117)
BUN: 13 mg/dL (ref 6–23)
CO2: 36 mEq/L — ABNORMAL HIGH (ref 19–32)
Calcium: 10.4 mg/dL (ref 8.4–10.5)
Chloride: 102 mEq/L (ref 96–112)
Creatinine, Ser: 0.73 mg/dL (ref 0.40–1.20)
GFR: 84.88 mL/min (ref >60.00)
Glucose, Bld: 102 mg/dL — ABNORMAL HIGH (ref 70–99)
Potassium: 4.6 mEq/L (ref 3.5–5.1)
Sodium: 144 mEq/L (ref 135–145)
Total Bilirubin: 0.3 mg/dL (ref 0.2–1.2)
Total Protein: 7.3 g/dL (ref 6.0–8.3)

## 2021-12-09 ENCOUNTER — Ambulatory Visit (INDEPENDENT_AMBULATORY_CARE_PROVIDER_SITE_OTHER): Payer: Medicare Other

## 2021-12-09 ENCOUNTER — Other Ambulatory Visit: Payer: Self-pay | Admitting: Neurosurgery

## 2021-12-09 ENCOUNTER — Other Ambulatory Visit: Payer: Self-pay

## 2021-12-09 DIAGNOSIS — Q282 Arteriovenous malformation of cerebral vessels: Secondary | ICD-10-CM | POA: Diagnosis not present

## 2021-12-09 DIAGNOSIS — G9389 Other specified disorders of brain: Secondary | ICD-10-CM | POA: Diagnosis not present

## 2021-12-09 DIAGNOSIS — R519 Headache, unspecified: Secondary | ICD-10-CM | POA: Diagnosis not present

## 2021-12-09 DIAGNOSIS — M50222 Other cervical disc displacement at C5-C6 level: Secondary | ICD-10-CM | POA: Diagnosis not present

## 2021-12-09 DIAGNOSIS — M542 Cervicalgia: Secondary | ICD-10-CM | POA: Diagnosis not present

## 2021-12-09 DIAGNOSIS — M7989 Other specified soft tissue disorders: Secondary | ICD-10-CM | POA: Diagnosis not present

## 2021-12-10 ENCOUNTER — Encounter: Payer: Self-pay | Admitting: Family Medicine

## 2021-12-10 DIAGNOSIS — G35 Multiple sclerosis: Secondary | ICD-10-CM | POA: Diagnosis not present

## 2021-12-23 ENCOUNTER — Other Ambulatory Visit: Payer: Self-pay

## 2021-12-23 ENCOUNTER — Encounter: Payer: Self-pay | Admitting: Neurology

## 2021-12-23 ENCOUNTER — Ambulatory Visit: Payer: Medicare Other | Admitting: Neurology

## 2021-12-23 ENCOUNTER — Other Ambulatory Visit (HOSPITAL_COMMUNITY): Payer: Self-pay

## 2021-12-23 VITALS — BP 142/82 | HR 83 | Ht 62.0 in | Wt 206.0 lb

## 2021-12-23 DIAGNOSIS — G35 Multiple sclerosis: Secondary | ICD-10-CM

## 2021-12-23 DIAGNOSIS — Z981 Arthrodesis status: Secondary | ICD-10-CM | POA: Insufficient documentation

## 2021-12-23 DIAGNOSIS — R519 Headache, unspecified: Secondary | ICD-10-CM

## 2021-12-23 DIAGNOSIS — Z79899 Other long term (current) drug therapy: Secondary | ICD-10-CM

## 2021-12-23 DIAGNOSIS — M5481 Occipital neuralgia: Secondary | ICD-10-CM | POA: Insufficient documentation

## 2021-12-23 DIAGNOSIS — E559 Vitamin D deficiency, unspecified: Secondary | ICD-10-CM | POA: Insufficient documentation

## 2021-12-23 MED ORDER — IMIPRAMINE HCL 25 MG PO TABS
25.0000 mg | ORAL_TABLET | Freq: Every day | ORAL | 3 refills | Status: DC
  Filled 2021-12-23: qty 90, 90d supply, fill #0

## 2021-12-23 NOTE — Progress Notes (Signed)
[facility]  PATIENT: Savannah Morrow DOB: 18-Sep-1956  REFERRING DOCTOR OR PCP: Ashok Pall MD (neurosurgery); Garret Reddish, MD (PCP) SOURCE: Patient, notes from Dr. Christella Noa, imaging and lab reports, MRI images personally reviewed.  _________________________________   HISTORICAL  CHIEF COMPLAINT:  Chief Complaint  Patient presents with   New Patient (Initial Visit)    Rm 2, w husband . Referred by Ashok Pall, MD for MS eval. Dx with MS on 12/10/21. Pt c/o of HA and L side neck pn today. Has had numbness in L side of face, off balance and dizziness.     HISTORY OF PRESENT ILLNESS:  I had the pleasure of seeing your patient, Savannah Morrow, at [facility] for neurologic consultation regarding her abnormal brain and cervical spine MRI suggestive of multiple sclerosis.  She is a 66 year old woman who had lower cervical spina and right muscle spasms and pain and numbness in the right arm in C6 dermatome.   She also noted right  hip pain and sometimes the leg would almost give out.     She developed Covid-19 during 08/2021.  She felt weaker afterwards and had left neck pain.   She noted an electric shock sensation going down the left neck.     She felt better using an ice pack.   She began to experience left occipital pain that would radiate to the temple and over her forehead.     In January she began to have reduced balance and she had pain in the left jaw and left facial numbness.   She improved some but still had pain and had a follow-up MRI 12/09/2021.    It showed the ACDF and a focus c/w MS.    MRI of the brain also showed foci c/w MS.     She is referred for further evaluation and treatment  Currently, she has left facial numbness ad left occipital pain radiating to the head.  She has headache.     She feels balance is a little worse.    She started using the bannister on stairs late last year.     She walks a mile a day as exercise.   However, she  was walking better last year.  She gets some pins/needles in her toes that fluctuate and is not constant.   The facial numbness is more constant.  She feels vision is stable.   No color desaturation.   She has had cataract surgery.    She has had stress incontinence (cough/laugh) > 5 years but it has worsened the last 4-5 months and she wears pads now.   She wears a left hearing aid x 2 years.      She has fatigue the past few months.     She has more difficulty with insomnia (sleep maintenance insomnia, waking up at 3-4 am and not being able to fall back asleep).   She has 2 x nocturia.     She denies any cognitive issues.   She denies depression or anxiety.      Several of her mother's cousins have MS.  Her sister had an AVM causing a cerebral hemorrhage.      She is fairly healthy with HTN and borderline DM.   She is trying to lose weight.     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  She had multiple CXR afterwards - all fine.   Last one 2010  Imaging review  MRI of the cervical spine 12/09/2021 shows a T2 hyperintense focus laterally to the right and the spinal cord adjacent to C7 and T1.   Additionally, there is a left posterolateral focus adjacent to C1-C2 that also appeared to be present on the 2022 MRI.   On the 2023 MRI, she is status post C5-C7 ACDF.  This study was compared to the MRI from 12/15/2020.  At that time, she had significant degenerative changes at C5-C6 and C6-C7 and also had spinal stenosis and foraminal narrowing at those levels.  The 2 probable demyelinating foci are present on this MRI.  MRI of the brain 12/09/2021 showed scattered T2/FLAIR hyperintense foci in the periventricular, juxtacortical and deep white matter of the hemispheres in a pattern compatible with multiple sclerosis.  There may be 1 small hyperintensity in the left basis pontis.  This study was done without contrast.    REVIEW OF SYSTEMS: Constitutional: No fevers, chills, sweats, or change in  appetite Eyes: No visual changes, double vision, eye pain Ear, nose and throat: No hearing loss, ear pain, nasal congestion, sore throat Cardiovascular: No chest pain, palpitations Respiratory:  No shortness of breath at rest or with exertion.   No wheezes GastrointestinaI: No nausea, vomiting, diarrhea, abdominal pain, fecal incontinence Genitourinary:  No dysuria, urinary retention or frequency.  No nocturia. Musculoskeletal:  No neck pain, back pain Integumentary: No rash, pruritus, skin lesions Neurological: as above Psychiatric: No depression at this time.  No anxiety Endocrine: No palpitations, diaphoresis, change in appetite, change in weigh or increased thirst Hematologic/Lymphatic:  No anemia, purpura, petechiae. Allergic/Immunologic: No itchy/runny eyes, nasal congestion, recent allergic reactions, rashes  ALLERGIES: Allergies  Allergen Reactions   Meperidine Hcl Anaphylaxis   Morphine    Percocet [Oxycodone-Acetaminophen]     rash   Sulfamethoxazole    Trimethoprim    Sulfamethoxazole-Trimethoprim Rash    HOME MEDICATIONS:  Current Outpatient Medications:    acetaminophen (TYLENOL) 650 MG CR tablet, Take 1,300 mg by mouth every 8 (eight) hours as needed for pain. Takes sparingly for her knees OTC, Disp: , Rfl:    atenolol-chlorthalidone (TENORETIC) 50-25 MG tablet, Take 1 tablet by mouth daily., Disp: 90 tablet, Rfl: 3   atorvastatin (LIPITOR) 20 MG tablet, Take 1 tablet (20 mg total) by mouth daily., Disp: 90 tablet, Rfl: 3   Cholecalciferol (VITAMIN D3) 50 MCG (2000 UT) capsule, Take by mouth daily., Disp: , Rfl:    fluticasone (FLONASE) 50 MCG/ACT nasal spray, Place 1 spray into both nostrils daily., Disp: 48 g, Rfl: 3   imipramine (TOFRANIL) 25 MG tablet, Take 1 tablet (25 mg total) by mouth at bedtime., Disp: 90 tablet, Rfl: 3   loratadine (CLARITIN) 10 MG tablet, Take 10 mg by mouth daily as needed., Disp: , Rfl:    Multiple Vitamins-Minerals (MULTIVITAMIN ADULTS  50+ PO), Take 1 tablet by mouth daily., Disp: , Rfl:    potassium chloride SA (KLOR-CON M) 20 MEQ tablet, Take 1 tablet (20 mEq total) by mouth daily., Disp: 90 tablet, Rfl: 3  PAST MEDICAL HISTORY: Past Medical History:  Diagnosis Date   Allergy    Arthritis    knees   Crohn's disease ([place])    colon resection in 20. Dr. Carlean Purl unsure of diagnosis- no evidence on colonoscopy   Heart murmur    Hx of adenomatous colonic polyps 03/17/2018   Hyperlipidemia    Hypertension    Multiple sclerosis ([place])    Uterine cancer ([place]) 1989   s/p hysterectomy  PAST SURGICAL HISTORY: Past Surgical History:  Procedure Laterality Date   APPENDECTOMY     BREAST BIOPSY Left 03/2019   fatty tissue   COLON SURGERY  1978-79   COLONOSCOPY     OOPHORECTOMY     x1   total hysterectomy     WISDOM TOOTH EXTRACTION      FAMILY HISTORY: Family History  Problem Relation Age of Onset   Uterine cancer Mother    Alzheimer's disease Mother        died 07-Dec-2019  Diabetes Mother    Heart disease Father    Hypertension Father    Cancer Father        b cell lymphoma   CVA Father        passed from stroke 24   AVM Sister        39 years younger. brain. significant in hospital 6 weeks. Ltach/trach feeding tube   Breast cancer Maternal Grandmother    Diabetes Brother    Colon cancer Neg Hx    Esophageal cancer Neg Hx    Stomach cancer Neg Hx     SOCIAL HISTORY:  Social History   Socioeconomic History   Marital status: Married    Spouse name: Jori Moll   Number of children: 3   Years of education: Not on file   Highest education level: Bachelor's degree (e.g., BA, AB, BS)  Occupational History   Occupation: retired  Tobacco Use   Smoking status: Never   Smokeless tobacco: Never  Vaping Use   Vaping Use: Never used  Substance and Sexual Activity   Alcohol use: No   Drug use: No   Sexual activity: Yes  Other Topics Concern   Not on file  Social History Narrative   Married 47  years in 2020. 3 children. 7 grandchildren. 4 greatgrandchildren.    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, commissioning work 2010, last worked with [employer]   Hobbies: working in garden, camping in [place]   Right handed   Caffeine: zero   Social Determinants of Radio broadcast assistant Strain: Low Risk    Difficulty of Paying Living Expenses: Not hard at [employer] Insecurity: No Food Insecurity   Worried About Charity fundraiser in the Last Year: Never true   Arboriculturist in the Last Year: Never true  Transportation Needs: No Transportation Needs   Lack of Transportation (Medical): No   Lack of Transportation (Non-Medical): No  Physical Activity: Sufficiently Active   Days of Exercise per Week: 5 days   Minutes of Exercise per Session: 60 min  Stress: No Stress Concern Present   Feeling of Stress : Not at all  Social Connections: Socially Integrated   Frequency of Communication with Friends and Family: More than three times a week   Frequency of Social Gatherings with Friends and Family: More than three times a week   Attends Religious Services: More than 4 times per year   Active Member of [employer] or Organizations: Yes   Attends Archivist Meetings: 1 to 4 times per year   Marital Status: Married  Human resources officer Violence: Not At Risk   Fear of Current or Ex-Partner: No   Emotionally Abused: No   Physically Abused: No   Sexually Abused: No     PHYSICAL EXAM  Vitals:   12/23/21 0844  BP: (!) 142/82  Pulse: 83  Weight: 206 lb (93.4 kg)  Height: 5' 2"  (1.575 m)  Body mass index is 37.68 kg/m.   General: The patient is well-developed and well-nourished and in no acute distress  HEENT:  Head is [place]/AT.  Sclera are anicteric.  Funduscopic exam shows normal optic discs and retinal vessels.  Neck: No carotid bruits are noted.  The neck is tender over the left occiput.  .  Cardiovascular: The heart has a regular rate and rhythm with a normal S1 and S2. There were no  murmurs, gallops or rubs.    Skin: Extremities are without rash or  edema.  Musculoskeletal:  Back is nontender  Neurologic Exam  Mental status: The patient is alert and oriented x 3 at the time of the examination. The patient has apparent normal recent and remote memory, with an apparently normal attention span and concentration ability.   Speech is normal.  Cranial nerves: Extraocular movements are full. Pupils are equal, round, and reactive to light and accomodation.  Visual fields are full.  Facial symmetry is present. There is good facial sensation to soft touch bilaterally.Facial strength is normal.  Trapezius and sternocleidomastoid strength is normal. No dysarthria is noted.  The tongue is midline, and the patient has symmetric elevation of the soft palate. No obvious hearing deficits are noted.  Motor:  Muscle bulk is normal.   Tone is normal. Strength is  5 / 5 in all 4 extremities.   Sensory: Sensory testing is intact to pinprick, soft touch and vibration sensation in all 4 extremities.  Coordination: Cerebellar testing reveals good finger-nose-finger and reduced right heel-to-shin bilaterally.  Gait and station: Station is normal.   Gait is normal. Tandem gait is wide. Romberg is negative.   Reflexes: Deep tendon reflexes are symmetric and normal bilaterally in arms.   Plantar responses are flexor.    DIAGNOSTIC DATA (LABS, IMAGING, TESTING) - I reviewed patient records, labs, notes, testing and imaging myself where available.  Lab Results  Component Value Date   WBC 6.8 05/28/2021   HGB 12.7 05/28/2021   HCT 38.6 05/28/2021   MCV 89.2 05/28/2021   PLT 259.0 05/28/2021      Component Value Date/Time   NA 144 12/02/2021 0936   K 4.6 12/02/2021 0936   CL 102 12/02/2021 0936   CO2 36 (H) 12/02/2021 0936   GLUCOSE 102 (H) 12/02/2021 0936   GLUCOSE 101 (H) 10/22/2006 1217   BUN 13 12/02/2021 0936   CREATININE 0.73 12/02/2021 0936   CREATININE 0.79 07/31/2020 1031    CALCIUM 10.4 12/02/2021 0936   PROT 7.3 12/02/2021 0936   ALBUMIN 4.7 12/02/2021 0936   AST 16 12/02/2021 0936   ALT 18 12/02/2021 0936   ALKPHOS 74 12/02/2021 0936   BILITOT 0.3 12/02/2021 0936   GFRNONAA 78.90 01/04/2010 0813   GFRAA 84 10/26/2008 0949   Lab Results  Component Value Date   CHOL 124 05/28/2021   HDL 43.10 05/28/2021   LDLCALC 57 05/28/2021   LDLDIRECT 63.0 11/09/2018   TRIG 119.0 05/28/2021   CHOLHDL 3 05/28/2021   Lab Results  Component Value Date   HGBA1C 5.6 12/02/2021   No results found for: VITAMINB12 Lab Results  Component Value Date   TSH 2.26 04/07/2016       ASSESSMENT AND PLAN  Multiple sclerosis ([place]) - Plan: Hepatitis B surface antigen, CBC with Differential/Platelet, HIV Antibody (routine testing w rflx), QuantiFERON-TB Gold Plus, Hepatitis C antibody, Hepatitis B surface antibody,qualitative, Hepatitis B core antibody, total, Hepatic function panel, Varicella zoster antibody, IgG, ANCA Profile  High risk  medication use - Plan: Hepatitis B surface antigen, CBC with Differential/Platelet, HIV Antibody (routine testing w rflx), QuantiFERON-TB Gold Plus, Hepatitis C antibody, Hepatitis B surface antibody,qualitative, Hepatitis B core antibody, total, Hepatic function panel, Varicella zoster antibody, IgG, ANCA Profile  Vitamin D deficiency - Plan: VITAMIN D 25 Hydroxy (Vit-D Deficiency, Fractures)  Occipital neuralgia of left side  Intractable headache, unspecified chronicity pattern, unspecified headache type  S/P cervical spinal fusion    In summary, Savannah Morrow is a 66 year old woman with reduced balance and episodes of numbness and weakness symptoms over the last year.  MRI shows mild reflex asymmetry and gait disturbance.  Her MRI is consistent with MS showing 2 foci within the spinal cord and several foci in the brain including 1 in the pons.  Some of the hemispheric foci are radially oriented to the ventricles in the periventricular  white matter.  The combination of her symptoms, exam and MRI is all consistent with multiple sclerosis.  We do not need to do additional testing such as lumbar puncture.  We discussed disease modifying therapies for MS.  As she appears to have had some activity over the last year we should initiate a medication.  She is not presenting aggressively and many options are available.  In more detail we discussed Aubagio and Mavenclad.  We also discussed that if she has breakthrough activity while being placed on 1 of these 2 we would probably step up therapy to one of the IV options.  We will check blood work and she will give the decision more thought over the next week.  A second problem is occipital neuralgia on the left.  I did a left splenius capitis trigger point injection with 80 mg Depo-Medrol and 3 cc Marcaine.  She tolerated the injection well and pain was better after a few minutes.  She will return to see me in 3 months or sooner if there are new or worsening neurologic symptoms.    Thank you for asking me to see Savannah Morrow.  Please let me know if I can be of further assistance with her or other patients in the future.  Tersea Aulds A. Felecia Shelling, MD, [facility] 0/10/6551, 7:48 PM Certified in Neurology, Clinical Neurophysiology, Sleep Medicine and Neuroimaging  [facility] [address] [place], [place] [address] [phone]

## 2021-12-25 LAB — CBC WITH DIFFERENTIAL/PLATELET
Basophils Absolute: 0 10*3/uL (ref 0.0–0.2)
Basos: 0 %
EOS (ABSOLUTE): 0.2 10*3/uL (ref 0.0–0.4)
Eos: 3 %
Hematocrit: 41.8 % (ref 34.0–46.6)
Hemoglobin: 13.8 g/dL (ref 11.1–15.9)
Immature Grans (Abs): 0 10*3/uL (ref 0.0–0.1)
Immature Granulocytes: 0 %
Lymphocytes Absolute: 1.2 10*3/uL (ref 0.7–3.1)
Lymphs: 17 %
MCH: 29.3 pg (ref 26.6–33.0)
MCHC: 33 g/dL (ref 31.5–35.7)
MCV: 89 fL (ref 79–97)
Monocytes Absolute: 0.5 10*3/uL (ref 0.1–0.9)
Monocytes: 6 %
Neutrophils Absolute: 5.5 10*3/uL (ref 1.4–7.0)
Neutrophils: 74 %
Platelets: 267 10*3/uL (ref 150–450)
RBC: 4.71 x10E6/uL (ref 3.77–5.28)
RDW: 12.6 % (ref 11.7–15.4)
WBC: 7.4 10*3/uL (ref 3.4–10.8)

## 2021-12-25 LAB — ANCA PROFILE
Anti-MPO Antibodies: <0.2 units (ref 0.0–0.9)
Anti-PR3 Antibodies: <0.2 units (ref 0.0–0.9)
Atypical pANCA: <1:20 {titer}
C-ANCA: <1:20 {titer}
P-ANCA: <1:20 {titer}

## 2021-12-25 LAB — HEPATIC FUNCTION PANEL
ALT: 25 IU/L (ref 0–32)
AST: 22 IU/L (ref 0–40)
Albumin: 4.8 g/dL (ref 3.8–4.8)
Alkaline Phosphatase: 89 IU/L (ref 44–121)
Bilirubin Total: 0.4 mg/dL (ref 0.0–1.2)
Bilirubin, Direct: 0.12 mg/dL (ref 0.00–0.40)
Total Protein: 7.1 g/dL (ref 6.0–8.5)

## 2021-12-25 LAB — QUANTIFERON-TB GOLD PLUS
QuantiFERON Mitogen Value: 9.66 IU/mL
QuantiFERON Nil Value: 0.05 IU/mL
QuantiFERON TB1 Ag Value: 0.08 IU/mL
QuantiFERON TB2 Ag Value: 0.09 IU/mL
QuantiFERON-TB Gold Plus: NEGATIVE

## 2021-12-25 LAB — HEPATITIS B SURFACE ANTIGEN: Hepatitis B Surface Ag: NEGATIVE

## 2021-12-25 LAB — HIV ANTIBODY (ROUTINE TESTING W REFLEX): HIV Screen 4th Generation wRfx: NONREACTIVE

## 2021-12-25 LAB — VARICELLA ZOSTER ANTIBODY, IGG: Varicella zoster IgG: 3549 index (ref >165)

## 2021-12-25 LAB — HEPATITIS B SURFACE ANTIBODY,QUALITATIVE: Hep B Surface Ab, Qual: NONREACTIVE

## 2021-12-25 LAB — VITAMIN D 25 HYDROXY (VIT D DEFICIENCY, FRACTURES): Vit D, 25-Hydroxy: 80.8 ng/mL (ref 30.0–100.0)

## 2021-12-25 LAB — HEPATITIS C ANTIBODY: Hep C Virus Ab: NONREACTIVE

## 2021-12-25 LAB — HEPATITIS B CORE ANTIBODY, TOTAL: Hep B Core Total Ab: NEGATIVE

## 2021-12-30 ENCOUNTER — Telehealth: Payer: Self-pay

## 2021-12-30 NOTE — Telephone Encounter (Addendum)
I called patient. She would prefer to start mavenclad. She has a hx of Crohn's disease and would like avoid medications with GI upset as a possible s/e including aubagio, DMF, Tecfidera, & Vumerity. She is willing to try another DMT if Rich Reining is not covered by insurance. She is aware that she likely won't be eligible for financial assistance since she has a Financial controller. I will check prior auth for mavenclad. [facility] will likely be calling patient to discuss mavenclad. Pt verbalized understanding. ? ?

## 2021-12-30 NOTE — Telephone Encounter (Signed)
-----   Message from Britt Bottom, MD sent at 12/28/2021  1:07 PM EST ----- ?Her lab work is fine.  We had discussed Aubagio and Mavenclad at her last visit.  She is a candidate for either 1.  Has she decided which 1 she preferred to go on? ?

## 2021-12-30 NOTE — Telephone Encounter (Signed)
PA for [facility] completed via CMM. Sent to [place]. Key: [id number]. Should have a determination within 3-5 business days. ?

## 2021-12-30 NOTE — Telephone Encounter (Signed)
Mavenclad start form signed by Dr. Felecia Shelling. Faxed to [place]. Received a receipt of confirmation. ? ?

## 2021-12-30 NOTE — Telephone Encounter (Signed)
Pt is asking for a call back from [place], [place]  ?

## 2021-12-30 NOTE — Telephone Encounter (Signed)
I called patient to discuss. No answer, left a message asking her to call us back. ?

## 2021-12-30 NOTE — Telephone Encounter (Signed)
Received a PA approval from [facility] from [place]. "Request Reference Number: [id number]. MAVENCLAD PAK 10MG(9) is approved through 10/19/2022. Your patient may now fill this prescription and it will be covered." ?

## 2022-01-03 ENCOUNTER — Emergency Department (HOSPITAL_COMMUNITY)
Admission: EM | Admit: 2022-01-03 | Discharge: 2022-01-03 | Disposition: A | Discharge status: Home / Self Care | Payer: Medicare Other | Attending: Student | Admitting: Student

## 2022-01-03 ENCOUNTER — Other Ambulatory Visit: Payer: Self-pay

## 2022-01-03 ENCOUNTER — Emergency Department (HOSPITAL_COMMUNITY): Payer: Medicare Other

## 2022-01-03 DIAGNOSIS — R0989 Other specified symptoms and signs involving the circulatory and respiratory systems: Secondary | ICD-10-CM | POA: Insufficient documentation

## 2022-01-03 DIAGNOSIS — Z981 Arthrodesis status: Secondary | ICD-10-CM | POA: Diagnosis not present

## 2022-01-03 DIAGNOSIS — Z79899 Other long term (current) drug therapy: Secondary | ICD-10-CM | POA: Diagnosis not present

## 2022-01-03 DIAGNOSIS — Z0389 Encounter for observation for other suspected diseases and conditions ruled out: Secondary | ICD-10-CM | POA: Diagnosis not present

## 2022-01-03 DIAGNOSIS — I10 Essential (primary) hypertension: Secondary | ICD-10-CM | POA: Insufficient documentation

## 2022-01-03 MED ORDER — LIDOCAINE VISCOUS HCL 2 % MT SOLN
15.0000 mL | Freq: Once | OROMUCOSAL | Status: AC
Start: 2022-01-03 — End: 2022-01-03
  Administered 2022-01-03: 15 mL via ORAL
  Filled 2022-01-03: qty 15

## 2022-01-03 MED ORDER — ALUM & MAG HYDROXIDE-SIMETH 200-200-20 MG/5ML PO SUSP
30.0000 mL | Freq: Once | ORAL | Status: AC
  Administered 2022-01-03: 30 mL via ORAL
  Filled 2022-01-03: qty 30

## 2022-01-03 NOTE — Discharge Instructions (Signed)
Exam and imaging are reassuring, recommend liquid to a soft diet for next couple days.  You may also take Maalox which you can find over-the-counter will help with inflammation in the throat. ? ?Please follow-up with GI for further evaluation ? ?Come back to the emergency department if you develop chest pain, shortness of breath, severe abdominal pain, uncontrolled nausea, vomiting, diarrhea. ? ?

## 2022-01-03 NOTE — ED Provider Notes (Signed)
?[place] DEPT ?Provider Note ? ? ?CSN: [id number] ?Arrival date & time: 01/03/22  2005 ? ?  ? ?History ? ?Chief Complaint  ?Patient presents with  ? Foreign Body  ? ? ?Savannah Morrow is a 66 y.o. female. ? ?HPI ? ?Patient with medical history including MS, hypertension, Crohn's disease presents with chief complaint of foreign body in the esophagus.  Patient states that around 11 AM today she took a vitamin D pill and  felt like it got hung up in her throat,  has been unable to dislodge it.  She states that she is tried the  Heimlich maneuver, eating and bread, without much relief. she states that she is still able to tolerate liquids but she feels as if it is only trickling down to her stomach.  She is nervous to try solid foods that she feels might get hung up.  She denies any nausea or vomiting, no shortness of breath or chest pain, she states that she recently had a neck surgery were her esophagus was moved and is concerned this might have made things worse.  She has no other complaints at this time. ? ?Patient's husband at bedside able to validate the story.  I have reviewed patient's chart she has not had an an EDG performed, no history of esophageal strictures. ? ?Home Medications ?Prior to Admission medications   ?Medication Sig Start Date End Date Taking? Authorizing Provider  ?acetaminophen (TYLENOL) 650 MG CR tablet Take 1,300 mg by mouth every 8 (eight) hours as needed for pain. Takes sparingly for her knees OTC    [provider]  ?atenolol-chlorthalidone (TENORETIC) 50-25 MG tablet Take 1 tablet by mouth daily. 05/15/21   Marin Olp, MD  ?atorvastatin (LIPITOR) 20 MG tablet Take 1 tablet (20 mg total) by mouth daily. 05/15/21   Marin Olp, MD  ?Cholecalciferol (VITAMIN D3) 50 MCG (2000 UT) capsule Take by mouth daily. 02/18/20   [provider]  ?fluticasone (FLONASE) 50 MCG/ACT nasal spray Place 1 spray into both nostrils daily. 05/11/19    Marin Olp, MD  ?imipramine (TOFRANIL) 25 MG tablet Take 1 tablet (25 mg total) by mouth at bedtime. 12/23/21   Sater, Nanine Means, MD  ?loratadine (CLARITIN) 10 MG tablet Take 10 mg by mouth daily as needed.    [provider]  ?Multiple Vitamins-Minerals (MULTIVITAMIN ADULTS 50+ PO) Take 1 tablet by mouth daily.    [provider]  ?potassium chloride SA (KLOR-CON M) 20 MEQ tablet Take 1 tablet (20 mEq total) by mouth daily. 07/01/21   Marin Olp, MD  ?   ? ?Allergies    ?Meperidine hcl, Morphine, Percocet [oxycodone-acetaminophen], Sulfamethoxazole, Trimethoprim, and Sulfamethoxazole-trimethoprim   ? ?Review of Systems   ?Review of Systems  ?Constitutional:  Negative for chills and fever.  ?HENT:  Positive for trouble swallowing. Negative for sore throat and tinnitus.   ?Respiratory:  Negative for shortness of breath.   ?Cardiovascular:  Negative for chest pain.  ?Gastrointestinal:  Negative for abdominal pain.  ?Neurological:  Negative for headaches.  ? ?Physical Exam ?Updated Vital Signs ?BP (!) 167/97   Pulse 100   Temp 98.3 ?F (36.8 ?C) (Oral)   Resp 16   LMP  (LMP Unknown)   SpO2 96%  ?Physical Exam ?Vitals and nursing note reviewed.  ?Constitutional:   ?   General: She is not in acute distress. ?   Appearance: She is not ill-appearing.  ?HENT:  ?  Head: Normocephalic and atraumatic.  ?   Nose: No congestion.  ?   Mouth/Throat:  ?   Mouth: Mucous membranes are moist.  ?   Pharynx: Oropharynx is clear. No oropharyngeal exudate or posterior oropharyngeal erythema.  ?   Comments: No trismus no torticollis tongue you have both midline, she is tolerating oral secretions, no tongue elevation, tonsils are equal symmetric bilaterally. ?Eyes:  ?   Conjunctiva/sclera: Conjunctivae normal.  ?Cardiovascular:  ?   Rate and Rhythm: Normal rate and regular rhythm.  ?   Pulses: Normal pulses.  ?   Heart sounds: No murmur heard. ?  No friction rub. No gallop.  ?Pulmonary:  ?   Effort: No  respiratory distress.  ?   Breath sounds: No wheezing, rhonchi or rales.  ?Skin: ?   General: Skin is warm and dry.  ?Neurological:  ?   Mental Status: She is alert.  ?Psychiatric:     ?   Mood and Affect: Mood normal.  ? ? ?ED Results / Procedures / Treatments   ?Labs ?(all labs ordered are listed, but only abnormal results are displayed) ?Labs Reviewed - No data to display ? ?EKG ?None ? ?Radiology ?DG Neck Soft Tissue ? ?Result Date: 01/03/2022 ?CLINICAL DATA:  Possible foreign body in esophagus. Patient reports vitamin-D pill may be stuck in throat. EXAM: NECK SOFT TISSUES - 1+ VIEW COMPARISON:  None. FINDINGS: There is no evidence of retropharyngeal soft tissue swelling or epiglottic enlargement. The cervical airway is unremarkable. No radio-opaque foreign body identified. Anterior C5 through C7 fusion hardware. Soft tissue planes are non suspicious. No visualized pneumomediastinum. IMPRESSION: Unremarkable soft tissue neck. No radio-opaque foreign body. Electronically Signed   By: Keith Rake M.D.   On: 01/03/2022 22:58   ? ?Procedures ?Procedures  ? ? ?Medications Ordered in ED ?Medications  ?alum & mag hydroxide-simeth (MAALOX/MYLANTA) 200-200-20 MG/5ML suspension 30 mL (30 mLs Oral Given 01/03/22 2234)  ?  And  ?lidocaine (XYLOCAINE) 2 % viscous mouth solution 15 mL (15 mLs Oral Given 01/03/22 2234)  ? ? ?ED Course/ Medical Decision Making/ A&P ?  ?                        ?Medical Decision Making ?Amount and/or Complexity of Data Reviewed ?Radiology: ordered. ? ?Risk ?OTC drugs. ?Prescription drug management. ? ? ?This patient presents to the ED for concern of foreign body in the esophagus, this involves an extensive number of treatment options, and is a complaint that carries with it a high risk of complications and morbidity.  The differential diagnosis includes impacting, esophagitis, foreign body in the airway ? ? ? ?Additional history obtained: ? ?Additional history obtained from [place]  record, husband was at bedside ?External records from outside source obtained and reviewed including please see HPI ? ? ?Co morbidities that complicate the patient evaluation ? ?MS ? ?Social Determinants of Health: ? ?N/A ? ? ? ?Lab Tests: ? ?I Ordered, and personally interpreted labs.  The pertinent results include: N/A ? ? ?Imaging Studies ordered: ? ?I ordered imaging studies including x-ray soft neck ?I independently visualized and interpreted imaging which showed negative for acute findings ?I agree with the radiologist interpretation ? ? ?Cardiac Monitoring: ? ?The patient was maintained on a cardiac monitor.  I personally viewed and interpreted the cardiac monitored which showed an underlying rhythm of: N/A ? ? ?Medicines ordered and prescription drug management: ? ?I ordered medication including GI cocktail for esophagus  pain ?I have reviewed the patients home medicines and have made adjustments as needed ? ?Reevaluation: ? ?Presents with concerns of foreign body in the esophagus, on my exam she is tolerating p.o., she is able to drink water without regurgitating it, she is controlling oral secretions, I suspect patient suffering from esophagitis, will obtain x-ray as well as provide her with GI cocktail and reassess ? ?Patient was reassessed updated lab work imaging, she was resting comfortably, has no complaints, she is agreeable for discharge at this time ? ? ?Rule out ?I have low suspicion for foreign body in the airway as lung sounds are clear bilaterally no new oxygen requirements, vital signs are reassuring.  I have low suspicion for esophagus impact and as she still tolerating p.o. controlling oral secretions.  I have low suspicion for allergic reaction as she has no systemic rash, no tongue throat or lip swelling no difficulty breathing.  Presentation atypical etiology ? ? ? ?Dispostion and problem list ? ?After consideration of the diagnostic results and the patients response to treatment, I feel  that the patent would benefit from discharge. ? ?Foreign body sensation-likely patient has pill induced esophagitis will recommend soft to liquid diet, recommend maalox to help with inflammation with the esophagus fo

## 2022-01-03 NOTE — ED Provider Triage Note (Signed)
Emergency Medicine Provider Triage Evaluation Note ? ?Bobbe Medico , a 66 y.o. female  was evaluated in triage.  Pt complains of foreign body sensation in the throat.  Patient states that she tried to take a vitamin earlier this evening and feels like it is lodged in her throat.  Patient states that she has pain with swallowing and can slowly feel liquids and food moving down when she tries to eat.  She has some mild drooling.  She has not had any vomiting.  She recently had an ACDF surgery and has been having issues with swallowing since then.  She was also recently diagnosed with MS.  Denies any stridor or difficulty breathing ? ?Review of Systems  ?Positive: As above ?Negative: As above ? ?Physical Exam  ?BP (!) 172/110 (BP Location: Left Arm)   Pulse (!) 108   Temp 97.8 ?F (36.6 ?C) (Oral)   Resp 18   LMP  (LMP Unknown)   SpO2 100%  ?Gen:   Awake, no distress   ?Resp:  Normal effort  ?MSK:   Moves extremities without difficulty  ?Other:  No visible foreign body in oropharyngeal inspection.  No tripoding.  Tolerating secretions well.  Speaking in full sentences. ? ?Medical Decision Making  ?Medically screening exam initiated at 8:55 PM.  Appropriate orders placed.  MARIEM SKOLNICK was informed that the remainder of the evaluation will be completed by another provider, this initial triage assessment does not replace that evaluation, and the importance of remaining in the ED until their evaluation is complete. ? ?  ?Garald Balding, PA-C ?01/03/22 2057 ? ?

## 2022-01-03 NOTE — ED Triage Notes (Signed)
Pt from home. Reports recent diagnosis of MS.  Pt took her vitamin D3 pill tonight and feels it is lodged in her throat.  Pt states she tried eating bread, drinking soup, and even the heimlich maneuver to get the pill up.  Pt is extremely tearful and states she has had more trouble with swallowing lately. Able to speak in full sentences.  Spitting into emesis bag but seems able to swallow some secretions.  ? ?

## 2022-01-06 ENCOUNTER — Encounter: Payer: Self-pay | Admitting: Neurology

## 2022-01-15 ENCOUNTER — Other Ambulatory Visit: Payer: Self-pay | Admitting: *Deleted

## 2022-01-15 ENCOUNTER — Other Ambulatory Visit (HOSPITAL_COMMUNITY): Payer: Self-pay

## 2022-01-15 ENCOUNTER — Encounter: Payer: Self-pay | Admitting: Neurology

## 2022-01-15 MED ORDER — METHYLPREDNISOLONE 4 MG PO TBPK
ORAL_TABLET | ORAL | 0 refills | Status: DC
  Filled 2022-01-15: qty 21, 6d supply, fill #0

## 2022-01-20 ENCOUNTER — Other Ambulatory Visit (HOSPITAL_COMMUNITY): Payer: Self-pay

## 2022-02-10 ENCOUNTER — Other Ambulatory Visit (HOSPITAL_COMMUNITY): Payer: Self-pay

## 2022-02-13 ENCOUNTER — Telehealth: Payer: Self-pay | Admitting: Neurology

## 2022-02-13 NOTE — Telephone Encounter (Signed)
Year One  ?Month One: ?01/13/2022-01/17/2022 ? ?Month two: ?02/11/2022-02/15/2022 ? ?

## 2022-02-17 ENCOUNTER — Encounter: Payer: Self-pay | Admitting: Neurology

## 2022-03-20 DIAGNOSIS — M50222 Other cervical disc displacement at C5-C6 level: Secondary | ICD-10-CM | POA: Diagnosis not present

## 2022-03-26 ENCOUNTER — Encounter: Payer: Self-pay | Admitting: Neurology

## 2022-03-26 ENCOUNTER — Ambulatory Visit: Payer: Medicare Other | Admitting: Neurology

## 2022-03-26 VITALS — BP 136/84 | HR 64 | Ht 62.0 in | Wt 210.5 lb

## 2022-03-26 DIAGNOSIS — G35 Multiple sclerosis: Secondary | ICD-10-CM

## 2022-03-26 DIAGNOSIS — Z79899 Other long term (current) drug therapy: Secondary | ICD-10-CM

## 2022-03-26 DIAGNOSIS — R200 Anesthesia of skin: Secondary | ICD-10-CM

## 2022-03-26 DIAGNOSIS — R269 Unspecified abnormalities of gait and mobility: Secondary | ICD-10-CM | POA: Diagnosis not present

## 2022-03-26 NOTE — Progress Notes (Signed)
[facility]  PATIENT: Savannah Morrow DOB: 05/31/1956  REFERRING DOCTOR OR PCP: Ashok Pall MD (neurosurgery); Garret Reddish, MD (PCP) SOURCE: Patient, notes from Dr. Christella Noa, imaging and lab reports, MRI images personally reviewed.  _________________________________   HISTORICAL  CHIEF COMPLAINT:  Chief Complaint  Patient presents with   Follow-up    Rm 1, MS on mavenclad (doing much better after 2 treatments).  Using cane.  Having facial numbness, occipital headaches better.      HISTORY OF PRESENT ILLNESS:  Savannah Morrow is a 66 y.o. woman with multiple sclerosis.  Update 03/26/2022 She started [facility] late March and April 2023.   She felt swelling in her eye with the first week but steroids helped.    She tolerated it well otherwise.    The second month was uneentful  Balance is about the same.   [name] is the same.    She started using the bannister on stairs in 2022  She walks a mile a day as exercise and is active.   She has a cane.  . She gets some pins/needles in her toes that fluctuate and is not constant.   The leftfacial numbness is more frequnet and often with heat. \.Vision is stable.   No color desaturation.   She has had cataract surgery.    She has had stress incontinence (cough/laugh) > 5 years but it has worsened the last 4-5 months and she wears pads now.   Imipramine helped the nocturia but caused nightmares so she stopped. Marland Kitchen  She wears a left hearing aid x 2 years.   Headaches are better now though she had some whil taking Mavenclad.     She notes symptoms are worse with heat and she likes gardening.        She has fatigue the past few months.     She is sleeping better.   .   She has 2 x nocturia.     She denies any cognitive issues.   She denies depression or anxiety.      She takes Vit D supplement  MS History She had lower cervical spina and right muscle spasms and pain and numbness in the right arm in C6 dermatome.   She also noted right   hip pain and sometimes the leg would almost give out.   She had surgery May 2022.    She developed Covid-19 during 08/2021.  She felt weaker afterwards and had left neck pain.   She noted an electric shock sensation going down the left neck.     She felt better using an ice pack.     In January she began to have reduced balance and she had pain in the left jaw and left facial numbness.   She improved some but still had pain and had a follow-up MRI 12/09/2021.    It showed the ACDF and a focus c/w MS.    MRI of the brain also showed foci c/w MS.     She is referred for further evaluation and treatment  Several of her mother's cousins have MS.  Her sister had an AVM causing a cerebral hemorrhage.      She is fairly healthy with HTN and borderline DM.   She is trying to lose weight.     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  She had multiple CXR afterwards - all fine.   Last one 2010  Imaging review  MRI  of the cervical spine 12/09/2021 shows a T2 hyperintense focus laterally to the right and the spinal cord adjacent to C7 and T1.   Additionally, there is a left posterolateral focus adjacent to C1-C2 that also appeared to be present on the 2022 MRI.   On the 2023 MRI, she is status post C5-C7 ACDF.  This study was compared to the MRI from 12/15/2020.  At that time, she had significant degenerative changes at C5-C6 and C6-C7 and also had spinal stenosis and foraminal narrowing at those levels.  The 2 probable demyelinating foci are present on this MRI.  MRI of the brain 12/09/2021 showed scattered T2/FLAIR hyperintense foci in the periventricular, juxtacortical and deep white matter of the hemispheres in a pattern compatible with multiple sclerosis.  There may be 1 small hyperintensity in the left basis pontis.  This study was done without contrast.    REVIEW OF SYSTEMS: Constitutional: No fevers, chills, sweats, or change in appetite Eyes: No visual changes, double vision, eye pain Ear,  nose and throat: No hearing loss, ear pain, nasal congestion, sore throat Cardiovascular: No chest pain, palpitations Respiratory:  No shortness of breath at rest or with exertion.   No wheezes GastrointestinaI: No nausea, vomiting, diarrhea, abdominal pain, fecal incontinence Genitourinary:  No dysuria, urinary retention or frequency.  No nocturia. Musculoskeletal:  No neck pain, back pain Integumentary: No rash, pruritus, skin lesions Neurological: as above Psychiatric: No depression at this time.  No anxiety Endocrine: No palpitations, diaphoresis, change in appetite, change in weigh or increased thirst Hematologic/Lymphatic:  No anemia, purpura, petechiae. Allergic/Immunologic: No itchy/runny eyes, nasal congestion, recent allergic reactions, rashes  ALLERGIES: Allergies  Allergen Reactions   Meperidine Hcl Anaphylaxis   Morphine    Percocet [Oxycodone-Acetaminophen]     rash   Sulfamethoxazole    Trimethoprim    Sulfamethoxazole-Trimethoprim Rash    HOME MEDICATIONS:  Current Outpatient Medications:    acetaminophen (TYLENOL) 650 MG CR tablet, Take 1,300 mg by mouth every 8 (eight) hours as needed for pain. Takes sparingly for her knees OTC, Disp: , Rfl:    atenolol-chlorthalidone (TENORETIC) 50-25 MG tablet, Take 1 tablet by mouth daily., Disp: 90 tablet, Rfl: 3   atorvastatin (LIPITOR) 20 MG tablet, Take 1 tablet (20 mg total) by mouth daily., Disp: 90 tablet, Rfl: 3   Cholecalciferol (VITAMIN D3) 50 MCG (2000 UT) capsule, Take by mouth daily., Disp: , Rfl:    Cladribine, 9 Tabs, (MAVENCLAD, 9 TABS,) 10 MG TBPK, Take 10 mg by mouth as directed. Month 1= 2 tablets PO on day 1-4. 1 tablet on day 5. Month 2= 2 tablets PO on day 1-3. 1 tablet on day 4-5. First dose of first cycle on 01/13/22., Disp: , Rfl:    fluticasone (FLONASE) 50 MCG/ACT nasal spray, Place 1 spray into both nostrils daily., Disp: 48 g, Rfl: 3   imipramine (TOFRANIL) 25 MG tablet, Take 1 tablet (25 mg total) by  mouth at bedtime., Disp: 90 tablet, Rfl: 3   loratadine (CLARITIN) 10 MG tablet, Take 10 mg by mouth daily as needed., Disp: , Rfl:    Multiple Vitamins-Minerals (MULTIVITAMIN ADULTS 50+ PO), Take 1 tablet by mouth daily., Disp: , Rfl:    potassium chloride SA (KLOR-CON M) 20 MEQ tablet, Take 1 tablet (20 mEq total) by mouth daily., Disp: 90 tablet, Rfl: 3  PAST MEDICAL HISTORY: Past Medical History:  Diagnosis Date   Allergy    Arthritis    knees   Crohn's disease ([place])  colon resection in 20. Dr. Carlean Purl unsure of diagnosis- no evidence on colonoscopy   Heart murmur    Hx of adenomatous colonic polyps 03/17/2018   Hyperlipidemia    Hypertension    Multiple sclerosis ([place])    Uterine cancer ([place]) 1989   s/p hysterectomy    PAST SURGICAL HISTORY: Past Surgical History:  Procedure Laterality Date   APPENDECTOMY     BREAST BIOPSY Left 03/2019   fatty tissue   COLON SURGERY  1978-79   COLONOSCOPY     OOPHORECTOMY     x1   total hysterectomy     WISDOM TOOTH EXTRACTION      FAMILY HISTORY: Family History  Problem Relation Age of Onset   Uterine cancer Mother    Alzheimer's disease Mother        died 2019/12/13  Diabetes Mother    Heart disease Father    Hypertension Father    Cancer Father        b cell lymphoma   CVA Father        passed from stroke 30   AVM Sister        80 years younger. brain. significant in hospital 6 weeks. Ltach/trach feeding tube   Breast cancer Maternal Grandmother    Diabetes Brother    Colon cancer Neg Hx    Esophageal cancer Neg Hx    Stomach cancer Neg Hx     SOCIAL HISTORY:  Social History   Socioeconomic History   Marital status: Married    Spouse name: Jori Moll   Number of children: 3   Years of education: Not on file   Highest education level: Bachelor's degree (e.g., BA, AB, BS)  Occupational History   Occupation: retired  Tobacco Use   Smoking status: Never   Smokeless tobacco: Never  Vaping Use   Vaping Use:  Never used  Substance and Sexual Activity   Alcohol use: No   Drug use: No   Sexual activity: Yes  Other Topics Concern   Not on file  Social History Narrative   Married 47 years in 2020. 3 children. 7 grandchildren. 4 greatgrandchildren.    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, commissioning work 2010, last worked with [employer]   Hobbies: working in garden, camping in [place]   Right handed   Caffeine: zero   Social Determinants of Radio broadcast assistant Strain: Low Risk    Difficulty of Paying Living Expenses: Not hard at [employer] Insecurity: No Food Insecurity   Worried About Charity fundraiser in the Last Year: Never true   Arboriculturist in the Last Year: Never true  Transportation Needs: No Transportation Needs   Lack of Transportation (Medical): No   Lack of Transportation (Non-Medical): No  Physical Activity: Sufficiently Active   Days of Exercise per Week: 5 days   Minutes of Exercise per Session: 60 min  Stress: No Stress Concern Present   Feeling of Stress : Not at all  Social Connections: Socially Integrated   Frequency of Communication with Friends and Family: More than three times a week   Frequency of Social Gatherings with Friends and Family: More than three times a week   Attends Religious Services: More than 4 times per year   Active Member of [employer] or Organizations: Yes   Attends Archivist Meetings: 1 to 4 times per year   Marital Status: Married  Human resources officer Violence: Not At Risk   Fear  of Current or Ex-Partner: No   Emotionally Abused: No   Physically Abused: No   Sexually Abused: No     PHYSICAL EXAM  Vitals:   03/26/22 0815  BP: 136/84  Pulse: 64  Weight: 210 lb 8 oz (95.5 kg)  Height: 5' 2"  (1.575 m)    Body mass index is 38.5 kg/m.   General: The patient is well-developed and well-nourished and in no acute distress  HEENT:  Head is [place]/AT.  Sclera are anicteric.   Skin: Extremities are without rash or  edema.  Musculoskeletal:   Back and neck are nontender  Neurologic Exam  Mental status: The patient is alert and oriented x 3 at the time of the examination. The patient has apparent normal recent and remote memory, with an apparently normal attention span and concentration ability.   Speech is normal.  Cranial nerves: Extraocular movements are full. Pupils are equal, round, and reactive to light and accomodation.  Facial strength and sensation are fine . No obvious hearing deficits are noted.  Motor:  Muscle bulk is normal.   Tone is normal. Strength is  5 / 5 in all 4 extremities.   Sensory: Sensory testing is intact to pinprick, soft touch and vibration sensation in all 4 extremities.  Coordination: Cerebellar testing reveals good finger-nose-finger and reduced right heel-to-shin bilaterally.  Gait and station: Station is normal.   Gait is normal. Tandem gait is wide. Turns 180 degrees in 4-5 steps.  Romberg is negative.   Reflexes: Deep tendon reflexes are symmetric and normal bilaterally in arms.       DIAGNOSTIC DATA (LABS, IMAGING, TESTING) - I reviewed patient records, labs, notes, testing and imaging myself where available.  Lab Results  Component Value Date   WBC 7.4 12/23/2021   HGB 13.8 12/23/2021   HCT 41.8 12/23/2021   MCV 89 12/23/2021   PLT 267 12/23/2021      Component Value Date/Time   NA 144 12/02/2021 0936   K 4.6 12/02/2021 0936   CL 102 12/02/2021 0936   CO2 36 (H) 12/02/2021 0936   GLUCOSE 102 (H) 12/02/2021 0936   GLUCOSE 101 (H) 10/22/2006 1217   BUN 13 12/02/2021 0936   CREATININE 0.73 12/02/2021 0936   CREATININE 0.79 07/31/2020 1031   CALCIUM 10.4 12/02/2021 0936   PROT 7.1 12/23/2021 1028   ALBUMIN 4.8 12/23/2021 1028   AST 22 12/23/2021 1028   ALT 25 12/23/2021 1028   ALKPHOS 89 12/23/2021 1028   BILITOT 0.4 12/23/2021 1028   GFRNONAA 78.90 01/04/2010 0813   GFRAA 84 10/26/2008 0949   Lab Results  Component Value Date   CHOL 124 05/28/2021   HDL 43.10  05/28/2021   LDLCALC 57 05/28/2021   LDLDIRECT 63.0 11/09/2018   TRIG 119.0 05/28/2021   CHOLHDL 3 05/28/2021   Lab Results  Component Value Date   HGBA1C 5.6 12/02/2021   No results found for: VITAMINB12 Lab Results  Component Value Date   TSH 2.26 04/07/2016       ASSESSMENT AND PLAN  Multiple sclerosis ([place]) - Plan: CBC with Differential/Platelet, Hepatic function panel  High risk medication use - Plan: CBC with Differential/Platelet, Hepatic function panel  Numbness  Gait disturbance  Continue Mavenclad (next cycle will be around March 2024).  Check labs (CBC/Dand LFT) and consider Valtrex if lymphocytes are 0.2 or lower. Stay active and exercise.  Eat well.   Continue Vit D suppl.   Rtc 4 months, will recheck labs then  Addendum:   Lymphovytes were mildly low at 0.5 - no need for Valtrex.    Georgia Baria A. Felecia Shelling, MD, [facility] 10/28/3435, 3:57 AM Certified in Neurology, Clinical Neurophysiology, Sleep Medicine and Neuroimaging  [facility] [address] [place], [place] [address] [phone]

## 2022-03-27 LAB — HEPATIC FUNCTION PANEL
ALT: 13 IU/L (ref 0–32)
AST: 17 IU/L (ref 0–40)
Albumin: 4.2 g/dL (ref 3.8–4.8)
Alkaline Phosphatase: 92 IU/L (ref 44–121)
Bilirubin Total: 0.5 mg/dL (ref 0.0–1.2)
Bilirubin, Direct: 0.17 mg/dL (ref 0.00–0.40)
Total Protein: 6.6 g/dL (ref 6.0–8.5)

## 2022-03-27 LAB — CBC WITH DIFFERENTIAL/PLATELET
Basophils Absolute: 0 10*3/uL (ref 0.0–0.2)
Basos: 1 %
EOS (ABSOLUTE): 0.2 10*3/uL (ref 0.0–0.4)
Eos: 3 %
Hematocrit: 39.3 % (ref 34.0–46.6)
Hemoglobin: 13.1 g/dL (ref 11.1–15.9)
Immature Grans (Abs): 0 10*3/uL (ref 0.0–0.1)
Immature Granulocytes: 0 %
Lymphocytes Absolute: 0.5 10*3/uL — ABNORMAL LOW (ref 0.7–3.1)
Lymphs: 9 %
MCH: 30.5 pg (ref 26.6–33.0)
MCHC: 33.3 g/dL (ref 31.5–35.7)
MCV: 91 fL (ref 79–97)
Monocytes Absolute: 0.7 10*3/uL (ref 0.1–0.9)
Monocytes: 11 %
Neutrophils Absolute: 4.4 10*3/uL (ref 1.4–7.0)
Neutrophils: 76 %
Platelets: 243 10*3/uL (ref 150–450)
RBC: 4.3 x10E6/uL (ref 3.77–5.28)
RDW: 13.3 % (ref 11.7–15.4)
WBC: 5.9 10*3/uL (ref 3.4–10.8)

## 2022-04-17 ENCOUNTER — Other Ambulatory Visit (HOSPITAL_COMMUNITY): Payer: Self-pay

## 2022-05-06 ENCOUNTER — Other Ambulatory Visit (HOSPITAL_COMMUNITY): Payer: Self-pay

## 2022-05-06 ENCOUNTER — Other Ambulatory Visit: Payer: Self-pay | Admitting: Family Medicine

## 2022-05-06 MED ORDER — ATORVASTATIN CALCIUM 20 MG PO TABS
20.0000 mg | ORAL_TABLET | Freq: Every day | ORAL | 3 refills | Status: DC
  Filled 2022-05-06: qty 90, 90d supply, fill #0
  Filled 2022-08-05: qty 90, 90d supply, fill #1
  Filled 2022-11-04: qty 90, 90d supply, fill #2
  Filled 2023-01-30: qty 90, 90d supply, fill #3

## 2022-05-06 MED ORDER — ATENOLOL-CHLORTHALIDONE 50-25 MG PO TABS
1.0000 | ORAL_TABLET | Freq: Every day | ORAL | 3 refills | Status: DC
  Filled 2022-05-06: qty 90, 90d supply, fill #0
  Filled 2022-08-05: qty 90, 90d supply, fill #1
  Filled 2022-11-04: qty 90, 90d supply, fill #2
  Filled 2023-01-30: qty 90, 90d supply, fill #3

## 2022-06-02 ENCOUNTER — Ambulatory Visit (INDEPENDENT_AMBULATORY_CARE_PROVIDER_SITE_OTHER): Payer: Medicare Other

## 2022-06-02 DIAGNOSIS — Z0000 Encounter for general adult medical examination without abnormal findings: Secondary | ICD-10-CM

## 2022-06-02 NOTE — Patient Instructions (Signed)
[name] , Thank you for taking time to come for your Medicare Wellness Visit. I appreciate your ongoing commitment to your health goals. Please review the following plan we discussed and let me know if I can assist you in the future.   Screening recommendations/referrals: Colonoscopy: Done 03/09/18 repeat every 5 years  Mammogram: done 07/30/21 repeat every year  Bone Density: done 06/02/19 repeat every 2 years  Recommended yearly ophthalmology/optometry visit for glaucoma screening and checkup Recommended yearly dental visit for hygiene and checkup  Vaccinations: Influenza vaccine: done 07/25/21 repeat every year  Pneumococcal vaccine: Up to date Tdap vaccine: completed 04/07/16 repeat every 10 years  Shingles vaccine: completed 7/9, 07/28/18  Covid-19:completed completed 3/23, 02/07/20, & 11/27/20  Advanced directives: Please bring a copy of your health care power of attorney and living will to the office at your convenience.  Conditions/risks identified: to get rid of using a cane   Next appointment: Follow up in one year for your annual wellness visit    Preventive Care 65 Years and Older, Female Preventive care refers to lifestyle choices and visits with your health care provider that can promote health and wellness. What does preventive care include? A yearly physical exam. This is also called an annual well check. Dental exams once or twice a year. Routine eye exams. Ask your health care provider how often you should have your eyes checked. Personal lifestyle choices, including: Daily care of your teeth and gums. Regular physical activity. Eating a healthy diet. Avoiding tobacco and drug use. Limiting alcohol use. Practicing safe sex. Taking low-dose aspirin every day. Taking vitamin and mineral supplements as recommended by your health care provider. What happens during an annual well check? The services and screenings done by your health care provider during your annual  well check will depend on your age, overall health, lifestyle risk factors, and family history of disease. Counseling  Your health care provider may ask you questions about your: Alcohol use. Tobacco use. Drug use. Emotional well-being. Home and relationship well-being. Sexual activity. Eating habits. History of falls. Memory and ability to understand (cognition). Work and work Statistician. Reproductive health. Screening  You may have the following tests or measurements: Height, weight, and BMI. Blood pressure. Lipid and cholesterol levels. These may be checked every 5 years, or more frequently if you are over 86 years old. Skin check. Lung cancer screening. You may have this screening every year starting at age 42 if you have a 30-pack-year history of smoking and currently smoke or have quit within the past 15 years. Fecal occult blood test (FOBT) of the stool. You may have this test every year starting at age 27. Flexible sigmoidoscopy or colonoscopy. You may have a sigmoidoscopy every 5 years or a colonoscopy every 10 years starting at age 30. Hepatitis C blood test. Hepatitis B blood test. Sexually transmitted disease (STD) testing. Diabetes screening. This is done by checking your blood sugar (glucose) after you have not eaten for a while (fasting). You may have this done every 1-3 years. Bone density scan. This is done to screen for osteoporosis. You may have this done starting at age 57. Mammogram. This may be done every 1-2 years. Talk to your health care provider about how often you should have regular mammograms. Talk with your health care provider about your test results, treatment options, and if necessary, the need for more tests. Vaccines  Your health care provider may recommend certain vaccines, such as: Influenza vaccine. This is recommended every  year. Tetanus, diphtheria, and acellular pertussis (Tdap, Td) vaccine. You may need a Td booster every 10 years. Zoster  vaccine. You may need this after age 110. Pneumococcal 13-valent conjugate (PCV13) vaccine. One dose is recommended after age 72. Pneumococcal polysaccharide (PPSV23) vaccine. One dose is recommended after age 52. Talk to your health care provider about which screenings and vaccines you need and how often you need them. This information is not intended to replace advice given to you by your health care provider. Make sure you discuss any questions you have with your health care provider. Document Released: 11/02/2015 Document Revised: 06/25/2016 Document Reviewed: 08/07/2015 Elsevier Interactive Patient Education  2017 [employer] Prevention in the Home Falls can cause injuries. They can happen to people of all ages. There are many things you can do to make your home safe and to help prevent falls. What can I do on the outside of my home? Regularly fix the edges of walkways and driveways and fix any cracks. Remove anything that might make you trip as you walk through a door, such as a raised step or threshold. Trim any bushes or trees on the path to your home. Use bright outdoor lighting. Clear any walking paths of anything that might make someone trip, such as rocks or tools. Regularly check to see if handrails are loose or broken. Make sure that both sides of any steps have handrails. Any raised decks and porches should have guardrails on the edges. Have any leaves, snow, or ice cleared regularly. Use sand or salt on walking paths during winter. Clean up any spills in your garage right away. This includes oil or grease spills. What can I do in the bathroom? Use night lights. Install grab bars by the toilet and in the tub and shower. Do not use towel bars as grab bars. Use non-skid mats or decals in the tub or shower. If you need to sit down in the shower, use a plastic, non-slip stool. Keep the floor dry. Clean up any water that spills on the floor as soon as it happens. Remove  soap buildup in the tub or shower regularly. Attach bath mats securely with double-sided non-slip rug tape. Do not have throw rugs and other things on the floor that can make you trip. What can I do in the bedroom? Use night lights. Make sure that you have a light by your bed that is easy to reach. Do not use any sheets or blankets that are too big for your bed. They should not hang down onto the floor. Have a firm chair that has side arms. You can use this for support while you get dressed. Do not have throw rugs and other things on the floor that can make you trip. What can I do in the kitchen? Clean up any spills right away. Avoid walking on wet floors. Keep items that you use a lot in easy-to-reach places. If you need to reach something above you, use a strong step stool that has a grab bar. Keep electrical cords out of the way. Do not use floor polish or wax that makes floors slippery. If you must use wax, use non-skid floor wax. Do not have throw rugs and other things on the floor that can make you trip. What can I do with my stairs? Do not leave any items on the stairs. Make sure that there are handrails on both sides of the stairs and use them. Fix handrails that are broken or  loose. Make sure that handrails are as long as the stairways. Check any carpeting to make sure that it is firmly attached to the stairs. Fix any carpet that is loose or worn. Avoid having throw rugs at the top or bottom of the stairs. If you do have throw rugs, attach them to the floor with carpet tape. Make sure that you have a light switch at the top of the stairs and the bottom of the stairs. If you do not have them, ask someone to add them for you. What else can I do to help prevent falls? Wear shoes that: Do not have high heels. Have rubber bottoms. Are comfortable and fit you well. Are closed at the toe. Do not wear sandals. If you use a stepladder: Make sure that it is fully opened. Do not climb a  closed stepladder. Make sure that both sides of the stepladder are locked into place. Ask someone to hold it for you, if possible. Clearly mark and make sure that you can see: Any grab bars or handrails. First and last steps. Where the edge of each step is. Use tools that help you move around (mobility aids) if they are needed. These include: Canes. Walkers. Scooters. Crutches. Turn on the lights when you go into a dark area. Replace any light bulbs as soon as they burn out. Set up your furniture so you have a clear path. Avoid moving your furniture around. If any of your floors are uneven, fix them. If there are any pets around you, be aware of where they are. Review your medicines with your doctor. Some medicines can make you feel dizzy. This can increase your chance of falling. Ask your doctor what other things that you can do to help prevent falls. This information is not intended to replace advice given to you by your health care provider. Make sure you discuss any questions you have with your health care provider. Document Released: 08/02/2009 Document Revised: 03/13/2016 Document Reviewed: 11/10/2014 Elsevier Interactive Patient Education  2017 [employer].

## 2022-06-02 NOTE — Progress Notes (Signed)
Virtual Visit via Telephone Note  I connected with  Savannah Morrow on 06/02/22 at  1:45 PM EDT by telephone and verified that I am speaking with the correct person using two identifiers.  Medicare Annual Wellness visit completed telephonically due to Covid-19 pandemic.   Persons participating in this call: This Health Coach and this patient.   Location: Patient: home Provider: office    I discussed the limitations, risks, security and privacy concerns of performing an evaluation and management service by telephone and the availability of in person appointments. The patient expressed understanding and agreed to proceed.  Unable to perform video visit due to video visit attempted and failed and/or patient does not have video capability.   Some vital signs may be absent or patient reported.   Willette Brace, LPN   Subjective:   Savannah Morrow is a 66 y.o. female who presents for Medicare Annual (Subsequent) preventive examination.  Review of Systems     Cardiac Risk Factors include: advanced age (>9mn, >>29women);dyslipidemia;hypertension;obesity (BMI >30kg/m2)     Objective:    There were no vitals filed for this visit. There is no height or weight on file to calculate BMI.     06/02/2022    1:59 PM 01/03/2022    9:15 PM 01/03/2022    8:21 PM 05/23/2021    1:53 PM 01/28/2021   11:48 AM 05/17/2020    2:01 PM  Advanced Directives  Does Patient Have a Medical Advance Directive? Yes No No Yes No Yes  Type of Advance Directive Living will;Healthcare Power of Attorney   Living will;Healthcare Power of [place]  Copy of [place] Chart? No - copy requested   Yes - validated most recent copy scanned in chart (See row information)  No - copy requested  Would patient like information on creating a medical advance directive?     No - Patient declined     Current Medications (verified) Outpatient Encounter Medications as  of 06/02/2022  Medication Sig   acetaminophen (TYLENOL) 650 MG CR tablet Take 1,300 mg by mouth every 8 (eight) hours as needed for pain. Takes sparingly for her knees OTC   atenolol-chlorthalidone (TENORETIC) 50-25 MG tablet Take 1 tablet by mouth daily.   atorvastatin (LIPITOR) 20 MG tablet Take 1 tablet (20 mg total) by mouth daily.   Cholecalciferol (VITAMIN D3) 50 MCG (2000 UT) capsule Take by mouth daily.   Cladribine, 9 Tabs, (MAVENCLAD, 9 TABS,) 10 MG TBPK Take 10 mg by mouth as directed. Month 1= 2 tablets PO on day 1-4. 1 tablet on day 5. Month 2= 2 tablets PO on day 1-3. 1 tablet on day 4-5. First dose of first cycle on 01/13/22.   fluticasone (FLONASE) 50 MCG/ACT nasal spray Place 1 spray into both nostrils daily.   loratadine (CLARITIN) 10 MG tablet Take 10 mg by mouth daily as needed.   Multiple Vitamins-Minerals (MULTIVITAMIN ADULTS 50+ PO) Take 1 tablet by mouth daily.   potassium chloride SA (KLOR-CON M) 20 MEQ tablet Take 1 tablet (20 mEq total) by mouth daily.   No facility-administered encounter medications on file as of 06/02/2022.    Allergies (verified) Meperidine hcl, Morphine, Percocet [oxycodone-acetaminophen], Sulfamethoxazole, Trimethoprim, and Sulfamethoxazole-trimethoprim   History: Past Medical History:  Diagnosis Date   Allergy    Arthritis    knees   Crohn's disease ([place]    colon resection in 20. Dr. GCarlean Purlunsure of diagnosis- no  evidence on colonoscopy   Heart murmur    Hx of adenomatous colonic polyps 03/17/2018   Hyperlipidemia    Hypertension    Multiple sclerosis ([place])    Uterine cancer ([place]) 1989   s/p hysterectomy   Past Surgical History:  Procedure Laterality Date   APPENDECTOMY     BREAST BIOPSY Left 03/2019   fatty tissue   COLON SURGERY  1978-79   COLONOSCOPY     OOPHORECTOMY     x1   total hysterectomy     WISDOM TOOTH EXTRACTION     Family History  Problem Relation Age of Onset   Uterine cancer Mother    Alzheimer's  disease Mother        died Dec 02, 2019  Diabetes Mother    Heart disease Father    Hypertension Father    Cancer Father        b cell lymphoma   CVA Father        passed from stroke 62   AVM Sister        37 years younger. brain. significant in hospital 6 weeks. Ltach/trach feeding tube   Breast cancer Maternal Grandmother    Diabetes Brother    Colon cancer Neg Hx    Esophageal cancer Neg Hx    Stomach cancer Neg Hx    Social History   Socioeconomic History   Marital status: Married    Spouse name: Jori Moll   Number of children: 3   Years of education: Not on file   Highest education level: Bachelor's degree (e.g., BA, AB, BS)  Occupational History   Occupation: retired  Tobacco Use   Smoking status: Never   Smokeless tobacco: Never  Vaping Use   Vaping Use: Never used  Substance and Sexual Activity   Alcohol use: No   Drug use: No   Sexual activity: Yes  Other Topics Concern   Not on file  Social History Narrative   Married 47 years in 2020. 3 children. 7 grandchildren. 4 greatgrandchildren.    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, commissioning work 2010, last worked with [employer]   Hobbies: working in garden, camping in [place]   Right handed   Caffeine: zero   Social Determinants of Health   Financial Resource Strain: Low Risk  (06/02/2022)   Overall Financial Resource Strain (CARDIA)    Difficulty of Paying Living Expenses: Not hard at all  Food Insecurity: No Food Insecurity (06/02/2022)   Hunger Vital Sign    Worried About Running Out of Food in the Last Year: Never true    [place] in the Last Year: Never true  Transportation Needs: No Transportation Needs (06/02/2022)   PRAPARE - Hydrologist (Medical): No    Lack of Transportation (Non-Medical): No  Physical Activity: Sufficiently Active (06/02/2022)   Exercise Vital Sign    Days of Exercise per Week: 7 days    Minutes of Exercise per Session: 30 min  Stress: No Stress Concern Present  (06/02/2022)   [place]    Feeling of Stress : Not at all  Social Connections: [place] (06/02/2022)   Social Connection and Isolation Panel [NHANES]    Frequency of Communication with Friends and Family: More than three times a week    Frequency of Social Gatherings with Friends and Family: Three times a week    Attends Religious Services: More than 4 times per year  Active Member of Clubs or Organizations: Yes    Attends Archivist Meetings: 1 to 4 times per year    Marital Status: Married    Tobacco Counseling Counseling given: Not Answered   Clinical Intake:  Pre-visit preparation completed: Yes  Pain : No/denies pain     BMI - recorded: 38.5 Nutritional Status: BMI > 30  Obese Nutritional Risks: None Diabetes: No  How often do you need to have someone help you when you read instructions, pamphlets, or other written materials from your doctor or pharmacy?: 1 - Never  Diabetic?no  Interpreter Needed?: No  Information entered by :: Charlott Rakes, LPN   Activities of Daily Living    06/02/2022    2:02 PM  In your present state of health, do you have any difficulty performing the following activities:  Hearing? 1  Comment wera hearing aid left ear  Vision? 0  Difficulty concentrating or making decisions? 0  Walking or climbing stairs? 1  Comment take time  Dressing or bathing? 0  Doing errands, shopping? 0  Preparing Food and eating ? N  Using the Toilet? N  In the past six months, have you accidently leaked urine? Y  Comment wears a pad  Do you have problems with loss of bowel control? N  Managing your Medications? N  Managing your Finances? N  Housekeeping or managing your Housekeeping? N    Patient Care Team: Marin Olp, MD as PCP - General (Family Medicine)  Indicate any recent Medical Services you may have received from other than [facility] providers in the  past year (date may be approximate).     Assessment:   This is a routine wellness examination for Savannah Morrow.  Hearing/Vision screen Hearing Screening - Comments:: Pt wears hearing aid left ear  Vision Screening - Comments:: Pt follows up with [facility]/ Dr sader  optometrist   Dietary issues and exercise activities discussed: Current Exercise Habits: Home exercise routine, Type of exercise: Other - see comments, Time (Minutes): 30, Frequency (Times/Week): 7, Weekly Exercise (Minutes/Week): 210   Goals Addressed             This Visit's Progress    Patient Stated       To not use cane        Depression Screen    06/02/2022    1:57 PM 05/23/2021    1:51 PM 11/12/2020   11:49 AM 05/21/2020    7:58 AM 05/17/2020    1:58 PM 05/11/2019    9:53 AM 11/09/2018    8:55 AM  PHQ 2/9 Scores  PHQ - 2 Score 0 0 0 0 0 0 0  PHQ- 9 Score    0       Fall Risk    06/02/2022    2:02 PM 05/23/2021    1:55 PM 05/21/2020    7:58 AM 05/17/2020    2:06 PM 05/11/2019    9:27 AM  [place] in the past year? 1 0 0 0 0  Number falls in past yr: 1 0 0 0 0  Injury with Fall? 0 0 0 0 0  Risk for fall due to : Impaired vision;Impaired balance/gait;Impaired mobility;History of fall(s) Impaired vision  Impaired vision   Risk for fall due to: Comment recent DX of MS      Follow up Falls prevention discussed Falls prevention discussed  Falls prevention discussed     FALL RISK PREVENTION PERTAINING TO THE HOME:  Any  stairs in or around the home? Yes  If so, are there any without handrails? No  Home free of loose throw rugs in walkways, pet beds, electrical cords, etc? Yes  Adequate lighting in your home to reduce risk of falls? Yes   ASSISTIVE DEVICES UTILIZED TO PREVENT FALLS:  Life alert? No  Use of a cane, walker or w/c? Yes  Grab bars in the bathroom? Yes  Shower chair or bench in shower? Yes  Elevated toilet seat or a handicapped toilet? No   TIMED UP AND GO:  Was the test performed? No .    Cognitive Function:        06/02/2022    2:06 PM 05/23/2021    1:58 PM 05/17/2020    2:10 PM  6CIT Screen  What Year? 0 points 0 points 0 points  What month? 0 points 0 points 0 points  What time? 0 points 0 points 0 points  Count back from 20 0 points 0 points 0 points  Months in reverse 0 points 0 points 0 points  Repeat phrase 0 points 0 points 2 points  Total Score 0 points 0 points 2 points    Immunizations Immunization History  Administered Date(s) Administered   Fluad Quad(high Dose 65+) 07/25/2019   Influenza Split 07/21/2011, 08/06/2012   Influenza Whole 08/24/2009   Influenza, High Dose Seasonal PF 07/24/2020, 07/25/2021   Influenza, Seasonal, Injecte, Preservative Fre 07/18/2015   Influenza,inj,Quad PF,6+ Mos 07/27/2013   Influenza-Unspecified 07/27/2014, 08/01/2016, 07/27/2017, 08/09/2018, 07/25/2019   Moderna Sars-Covid-2 Vaccination 01/10/2020, 02/07/2020, 11/27/2020   Pneumococcal Conjugate-13 05/21/2020   Pneumococcal Polysaccharide-23 05/11/2019   Td 10/20/2005   Tdap 04/07/2016   Zoster Recombinat (Shingrix) 04/27/2018, 07/28/2018   Zoster, Live 03/15/2014    TDAP status: Up to date  Flu Vaccine status: Up to date  Pneumococcal vaccine status: Up to date  Covid-19 vaccine status: Completed vaccines  Qualifies for Shingles Vaccine? Yes   Zostavax completed Yes   Shingrix Completed?: Yes  Screening Tests Health Maintenance  Topic Date Due   COVID-19 Vaccine (4 - Moderna risk series) 01/22/2021   INFLUENZA VACCINE  05/20/2022   COLONOSCOPY (Pts 45-64yr Insurance coverage will need to be confirmed)  03/10/2023   MAMMOGRAM  07/31/2023   TETANUS/TDAP  04/07/2026   Pneumonia Vaccine 68 Years old  Completed   DEXA SCAN  Completed   Hepatitis C Screening  Completed   Zoster Vaccines- Shingrix  Completed   HPV VACCINES  Aged Out    Health Maintenance  Health Maintenance Due  Topic Date Due   COVID-19 Vaccine (4 - Moderna risk series)  01/22/2021   INFLUENZA VACCINE  05/20/2022    Colorectal cancer screening: Type of screening: Colonoscopy. Completed 03/09/18. Repeat every 5 years  Mammogram status: Completed 07/30/21. Repeat every year  Bone Density status: Completed 06/02/19. Results reflect: Bone density results: OSTEOPENIA. Repeat every 2 years.  Additional Screening:  Hepatitis C Screening: Completed 12/23/21  Vision Screening: Recommended annual ophthalmology exams for early detection of glaucoma and other disorders of the eye. Is the patient up to date with their annual eye exam?  Yes  Who is the provider or what is the name of the office in which the patient attends annual eye exams? Dr sader and nBurna Sis If pt is not established with a provider, would they like to be referred to a provider to establish care? No .   Dental Screening: Recommended annual dental exams for proper oral hygiene  Community Resource Referral /  Chronic Care Management: CRR required this visit?  No   CCM required this visit?  No      Plan:     I have personally reviewed and noted the following in the patient's chart:   Medical and social history Use of alcohol, tobacco or illicit drugs  Current medications and supplements including opioid prescriptions.  Functional ability and status Nutritional status Physical activity Advanced directives List of other physicians Hospitalizations, surgeries, and ER visits in previous 12 months Vitals Screenings to include cognitive, depression, and falls Referrals and appointments  In addition, I have reviewed and discussed with patient certain preventive protocols, quality metrics, and best practice recommendations. A written personalized care plan for preventive services as well as general preventive health recommendations were provided to patient.     Willette Brace, LPN   [id number]   Nurse Notes: none

## 2022-06-03 ENCOUNTER — Encounter: Payer: Self-pay | Admitting: Family Medicine

## 2022-06-03 ENCOUNTER — Ambulatory Visit (INDEPENDENT_AMBULATORY_CARE_PROVIDER_SITE_OTHER): Payer: Medicare Other | Admitting: Family Medicine

## 2022-06-03 VITALS — BP 110/72 | HR 63 | Temp 98.4°F | Ht 62.0 in | Wt 206.8 lb

## 2022-06-03 DIAGNOSIS — I10 Essential (primary) hypertension: Secondary | ICD-10-CM

## 2022-06-03 DIAGNOSIS — Z0000 Encounter for general adult medical examination without abnormal findings: Secondary | ICD-10-CM

## 2022-06-03 DIAGNOSIS — E785 Hyperlipidemia, unspecified: Secondary | ICD-10-CM

## 2022-06-03 DIAGNOSIS — R739 Hyperglycemia, unspecified: Secondary | ICD-10-CM

## 2022-06-03 DIAGNOSIS — M85851 Other specified disorders of bone density and structure, right thigh: Secondary | ICD-10-CM

## 2022-06-03 LAB — CBC WITH DIFFERENTIAL/PLATELET
Basophils Absolute: 0 10*3/uL (ref 0.0–0.1)
Basophils Relative: 0.4 % (ref 0.0–3.0)
Eosinophils Absolute: 0.1 10*3/uL (ref 0.0–0.7)
Eosinophils Relative: 1.4 % (ref 0.0–5.0)
HCT: 42 % (ref 36.0–46.0)
Hemoglobin: 14 g/dL (ref 12.0–15.0)
Lymphocytes Relative: 10 % — ABNORMAL LOW (ref 12.0–46.0)
Lymphs Abs: 0.7 10*3/uL (ref 0.7–4.0)
MCHC: 33.4 g/dL (ref 30.0–36.0)
MCV: 89 fl (ref 78.0–100.0)
Monocytes Absolute: 0.6 10*3/uL (ref 0.1–1.0)
Monocytes Relative: 8.8 % (ref 3.0–12.0)
Neutro Abs: 5.3 10*3/uL (ref 1.4–7.7)
Neutrophils Relative %: 79.4 % — ABNORMAL HIGH (ref 43.0–77.0)
Platelets: 221 10*3/uL (ref 150.0–400.0)
RBC: 4.73 Mil/uL (ref 3.87–5.11)
RDW: 13.6 % (ref 11.5–15.5)
WBC: 6.6 10*3/uL (ref 4.0–10.5)

## 2022-06-03 LAB — COMPREHENSIVE METABOLIC PANEL
ALT: 14 U/L (ref 0–35)
AST: 17 U/L (ref 0–37)
Albumin: 4.6 g/dL (ref 3.5–5.2)
Alkaline Phosphatase: 78 U/L (ref 39–117)
BUN: 21 mg/dL (ref 6–23)
CO2: 32 mEq/L (ref 19–32)
Calcium: 10.2 mg/dL (ref 8.4–10.5)
Chloride: 98 mEq/L (ref 96–112)
Creatinine, Ser: 0.77 mg/dL (ref 0.40–1.20)
GFR: 79.34 mL/min (ref >60.00)
Glucose, Bld: 95 mg/dL (ref 70–99)
Potassium: 4.8 mEq/L (ref 3.5–5.1)
Sodium: 140 mEq/L (ref 135–145)
Total Bilirubin: 0.9 mg/dL (ref 0.2–1.2)
Total Protein: 7.4 g/dL (ref 6.0–8.3)

## 2022-06-03 LAB — LIPID PANEL
Cholesterol: 132 mg/dL (ref 0–200)
HDL: 47.2 mg/dL (ref >39.00)
LDL Cholesterol: 69 mg/dL (ref 0–99)
NonHDL: 85.1
Total CHOL/HDL Ratio: 3
Triglycerides: 83 mg/dL (ref 0.0–149.0)
VLDL: 16.6 mg/dL (ref 0.0–40.0)

## 2022-06-03 LAB — HEMOGLOBIN A1C: Hgb A1c MFr Bld: 5.8 % (ref 4.6–6.5)

## 2022-06-03 NOTE — Progress Notes (Signed)
Phone [phone]   Subjective:  Patient presents today for their annual physical. Chief complaint-noted.   See problem oriented charting- ROS- full  review of systems was completed and negative except for: fatigue, joint pain, muscle aches, seasonal allergies, immunocompromised, facial numbness and weakness  The following were reviewed and entered/updated in epic: Past Medical History:  Diagnosis Date   Allergy    Arthritis    knees   Crohn's disease ([place])    colon resection in 20. Dr. Carlean Purl unsure of diagnosis- no evidence on colonoscopy   Heart murmur    Hx of adenomatous colonic polyps 03/17/2018   Hyperlipidemia    Hypertension    Multiple sclerosis ([place])    Uterine cancer ([place]) 1989   s/p hysterectomy   Patient Active Problem List   Diagnosis Date Noted   Multiple sclerosis ([place]) 12/23/2021    Priority: High   Morbid obesity ([place]) 03/07/2011    Priority: High   Vitamin D deficiency 12/23/2021    Priority: Medium    Osteopenia 05/21/2020    Priority: Medium    Hx of adenomatous colonic polyps 03/17/2018    Priority: Medium    Family history of cerebrovascular accident (CVA) in sister 10/06/2017    Priority: Medium    Hyperglycemia 04/07/2016    Priority: Medium    Crohn's disease ([place])     Priority: Medium    Hyperlipidemia 06/02/2007    Priority: Medium    Essential hypertension 06/02/2007    Priority: Medium    S/P cervical spinal fusion 12/23/2021    Priority: Low   Hearing loss 11/11/2019    Priority: Low   S/P total hysterectomy 04/14/2018    Priority: Low   Chest pain 03/30/2014    Priority: Low   Osteoarthritis of both knees 01/10/2010    Priority: Low   Edema 01/10/2010    Priority: Low   Allergic rhinitis 06/02/2007    Priority: Low   Numbness 03/26/2022   Gait disturbance 03/26/2022   High risk medication use 12/23/2021   Occipital neuralgia of left side 12/23/2021   Spinal stenosis in cervical region 12/18/2020   Past Surgical  History:  Procedure Laterality Date   APPENDECTOMY     BREAST BIOPSY Left 03/2019   fatty tissue   COLON SURGERY  1978-79   COLONOSCOPY     OOPHORECTOMY     x1   total hysterectomy     WISDOM TOOTH EXTRACTION      Family History  Problem Relation Age of Onset   Uterine cancer Mother    Alzheimer's disease Mother        died 11-26-19  Diabetes Mother    Heart disease Father    Hypertension Father    Cancer Father        b cell lymphoma   CVA Father        passed from stroke 30   AVM Sister        11 years younger. brain. significant in hospital 6 weeks. Ltach/trach feeding tube   Breast cancer Maternal Grandmother    Diabetes Brother    Colon cancer Neg Hx    Esophageal cancer Neg Hx    Stomach cancer Neg Hx     Medications- reviewed and updated Current Outpatient Medications  Medication Sig Dispense Refill   acetaminophen (TYLENOL) 650 MG CR tablet Take 1,300 mg by mouth every 8 (eight) hours as needed for pain. Takes sparingly for her knees OTC  atenolol-chlorthalidone (TENORETIC) 50-25 MG tablet Take 1 tablet by mouth daily. 90 tablet 3   atorvastatin (LIPITOR) 20 MG tablet Take 1 tablet (20 mg total) by mouth daily. 90 tablet 3   Cholecalciferol (VITAMIN D3) 50 MCG (2000 UT) capsule Take by mouth daily.     Cladribine, 9 Tabs, (MAVENCLAD, 9 TABS,) 10 MG TBPK Take 10 mg by mouth as directed. Month 1= 2 tablets PO on day 1-4. 1 tablet on day 5. Month 2= 2 tablets PO on day 1-3. 1 tablet on day 4-5. First dose of first cycle on 01/13/22.     fluticasone (FLONASE) 50 MCG/ACT nasal spray Place 1 spray into both nostrils daily. 48 g 3   loratadine (CLARITIN) 10 MG tablet Take 10 mg by mouth daily as needed.     Multiple Vitamins-Minerals (MULTIVITAMIN ADULTS 50+ PO) Take 1 tablet by mouth daily.     potassium chloride SA (KLOR-CON M) 20 MEQ tablet Take 1 tablet (20 mEq total) by mouth daily. 90 tablet 3   No current facility-administered medications for this visit.     Allergies-reviewed and updated Allergies  Allergen Reactions   Meperidine Hcl Anaphylaxis   Morphine    Percocet [Oxycodone-Acetaminophen]     rash   Sulfamethoxazole    Trimethoprim    Sulfamethoxazole-Trimethoprim Rash    Social History   Social History Narrative   Married 47 years in 2020. 3 children. 7 grandchildren. 4 greatgrandchildren.    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 Oropharynx normal Neck: no thyromegaly CV: RRR no murmurs rubs or gallops Lungs: CTAB no crackles, wheeze, rhonchi Abdomen: soft/nontender/nondistended/normal bowel sounds. No rebound or guarding.  Ext: trace edema Skin: warm, dry Neuro: grossly normal, moves all extremities, PERRLA, walks with cane   Assessment and Plan   66 y.o. female presenting for annual physical.  Health Maintenance counseling: 1. Anticipatory guidance: Patient counseled regarding regular dental exams -q6 months, eye exams - yearly- was told to see optho by Dr. Felecia Shelling (we can place referral if needed- she will call),  avoiding smoking and second hand smoke , limiting alcohol to 1 beverage per day-doesn't drink , no illicit drugs .   2. Risk factor reduction:  Advised patient of need for regular exercise and diet rich and fruits and vegetables to reduce risk of heart attack and stroke.  Exercise- limited in exercise after MS diagnosis- cannot walk her 2 miles anymore- doing virtual classes with senior resources and tries to walk perimeter in her yard 4x a day.  Diet/weight management-down 2 lbs despite all of barriers.  Wt Readings from Last 3 Encounters:  06/03/22 206  lb 12.8 oz (93.8 kg)  03/26/22 210 lb 8 oz (95.5 kg)  12/23/21 206 lb (93.4 kg)  3. Immunizations/screenings/ancillary studies- recommended flu shot. Discussed covid immunization - was told by Dr. Felecia Shelling to hold off for now- will check in about this and flu shot in the fall.  Immunization History  Administered Date(s) Administered   Fluad Quad(high Dose 65+) 07/25/2019  Influenza Split 07/21/2011, 08/06/2012   Influenza Whole 08/24/2009   Influenza, High Dose Seasonal PF 07/24/2020, 07/25/2021   Influenza, Seasonal, Injecte, Preservative Fre 07/18/2015   Influenza,inj,Quad PF,6+ Mos 07/27/2013   Influenza-Unspecified 07/27/2014, 08/01/2016, 07/27/2017, 08/09/2018, 07/25/2019   Moderna Sars-Covid-2 Vaccination 01/10/2020, 02/07/2020, 11/27/2020   Pneumococcal Conjugate-13 05/21/2020   Pneumococcal Polysaccharide-23 05/11/2019   Td 10/20/2005   Tdap 04/07/2016   Zoster Recombinat (Shingrix) 04/27/2018, 07/28/2018   Zoster, Live 03/15/2014  4. Cervical cancer screening- hysterectomy for uterine cancer and removed cervix plus past age based screening recommendatoins 5. Breast cancer screening-  breast exam = prefers self exams= and mammogram 07/30/21 6. Colon cancer screening - 03/09/2018 with 5 year follow up- due next year 7. Skin cancer screening- Dr. [place] Crane in past- no recent visits- advised regular sunscreen use. Denies worrisome, changing, or new skin lesions.  8. Birth control/STD check- hysterectomy/postmenopausal/only active with husband 10. Osteoporosis screening at 89- slight osteopenia at 53- will update this year- still doing D3 and trying to keep up weight bearing exercise even with limitations 10. Smoking associated screening - Never smoker  Status of chronic or acute concerns   #Multiple sclerosis-originally diagnosed after MRI every 20 2023 of cervical spine with MRI of the brain with foci consistent with MS. Prior neurosurgery Dr. Christella Noa.   Following closely with Dr. Rachel Moulds.   On Mavenclad-next cycle planned March 2024- she states will start February- research only for 2 years she reports  - wants to get off cane but has had a fall at church at the kitchen- fell backwards- luckily no injury- fell into bin of utensils - wonders if related to covid before symptoms started -some improvements on mavenclad -monitor WBC and LFTs Lab Results  Component Value Date   WBC 5.9 03/26/2022   HGB 13.1 03/26/2022   HCT 39.3 03/26/2022   MCV 91 03/26/2022   PLT 243 03/26/2022   #hypertension S: medication: atenolol-chlorthalidone 50-25Mg BP Readings from Last 3 Encounters:  06/03/22 110/72  03/26/22 136/84  01/03/22 (!) 167/97  A/P: Controlled. Continue current medications.   #hyperlipidemia S: Medication: Atorvastatin 20Mg Lab Results  Component Value Date   CHOL 124 05/28/2021   HDL 43.10 05/28/2021   LDLCALC 57 05/28/2021   LDLDIRECT 63.0 11/09/2018   TRIG 119.0 05/28/2021   CHOLHDL 3 05/28/2021  A/P: hopefully stable- update lipid panel today. Continue current meds for now  # Hyperglycemia/insulin resistance/prediabetes- peak a1c of 5.8.  #morbid obesity S:  Medication: none Exercise and diet- see above Lab Results  Component Value Date   HGBA1C 5.6 12/02/2021   HGBA1C 5.7 05/28/2021   HGBA1C 5.8 11/21/2020  A/P: hopefully stable- update a1c today. Continue current meds for now  #crohn's disease-  S:  Colon resection in 20s. Dr. Carlean Purl is not certain this is acurate diagnosis. No evidence on repeat colonoscopy. No blood in stool or abdominal cramping A/P: no issues- continue to monitor.   #Right knee pain likely from osteoarthritis-Weight loss is key and she is working on this.  Voltaren gel helps some- uses topically - cautious about tylenol arthritis due to liver risks  #Vitamin D deficiency S: Medication: 2000 units a  Last vitamin D Lab Results  Component Value Date   VD25OH 80.8 12/23/2021  A/P: excellent on last check- hold off today    Recommended follow up: Return in about 6 months (around 12/04/2022) for followup or sooner if needed.Schedule b4 you leave. Future Appointments  Date Time Provider [place]  07/23/2022  8:30  AM Sater, Nanine Means, MD [facility] None  06/05/2023  1:00 PM [facility]   Lab/Order associations: fasting   ICD-10-CM   1. Preventative health care  Z00.00     2. Osteopenia of neck of right femur  M85.851 DG Bone Density    3. Hyperlipidemia, unspecified hyperlipidemia type  E78.5 CBC with Differential/Platelet    Comprehensive metabolic panel    Lipid panel    4. Essential hypertension  I10     5. Hyperglycemia  R73.9 HgB A1c      No orders of the defined types were placed in this encounter.   Return precautions advised.  Garret Reddish, MD

## 2022-06-03 NOTE — Patient Instructions (Addendum)
Flu shot- we should have these available within a month or two but please let us know if you get at outside pharmacy -double check with Dr. Felecia Shelling  Schedule your bone density test at check out desk.  - located [address] across the street from [place] - in the basement - you DO NEED an appointment for the bone density tests.   Please stop by lab before you go If you have mychart- we will send your results within 3 business days of [place] receiving them.  If you do not have mychart- we will call you about results within 5 business days of [place] receiving them.  *please also note that you will see labs on mychart as soon as they post. I will later go in and write notes on them- will say "notes from Dr. Yong Channel"   Recommended follow up: Return in about 6 months (around 12/04/2022) for followup or sooner if needed.Schedule b4 you leave.

## 2022-06-04 ENCOUNTER — Ambulatory Visit (INDEPENDENT_AMBULATORY_CARE_PROVIDER_SITE_OTHER)
Admission: RE | Admit: 2022-06-04 | Discharge: 2022-06-04 | Disposition: A | Discharge status: Home / Self Care | Payer: Medicare Other | Source: Ambulatory Visit | Attending: Family Medicine | Admitting: Family Medicine

## 2022-06-04 DIAGNOSIS — M85851 Other specified disorders of bone density and structure, right thigh: Secondary | ICD-10-CM

## 2022-06-06 ENCOUNTER — Encounter: Payer: Self-pay | Admitting: Neurology

## 2022-07-13 ENCOUNTER — Other Ambulatory Visit: Payer: Self-pay | Admitting: Family Medicine

## 2022-07-14 ENCOUNTER — Encounter: Payer: Self-pay | Admitting: *Deleted

## 2022-07-15 ENCOUNTER — Other Ambulatory Visit (HOSPITAL_COMMUNITY): Payer: Self-pay

## 2022-07-15 MED ORDER — POTASSIUM CHLORIDE CRYS ER 20 MEQ PO TBCR
20.0000 meq | EXTENDED_RELEASE_TABLET | Freq: Every day | ORAL | 3 refills | Status: DC
  Filled 2022-07-15: qty 90, 90d supply, fill #0
  Filled 2022-10-14: qty 90, 90d supply, fill #1
  Filled 2023-01-08: qty 90, 90d supply, fill #2
  Filled 2023-04-08: qty 90, 90d supply, fill #3

## 2022-07-23 ENCOUNTER — Ambulatory Visit: Payer: Medicare Other | Admitting: Neurology

## 2022-07-23 ENCOUNTER — Encounter: Payer: Self-pay | Admitting: Neurology

## 2022-07-23 ENCOUNTER — Other Ambulatory Visit: Payer: Self-pay | Admitting: Emergency Medicine

## 2022-07-23 VITALS — BP 136/84 | HR 62 | Ht 62.0 in | Wt 207.0 lb

## 2022-07-23 DIAGNOSIS — E559 Vitamin D deficiency, unspecified: Secondary | ICD-10-CM

## 2022-07-23 DIAGNOSIS — R200 Anesthesia of skin: Secondary | ICD-10-CM | POA: Diagnosis not present

## 2022-07-23 DIAGNOSIS — Z79899 Other long term (current) drug therapy: Secondary | ICD-10-CM | POA: Diagnosis not present

## 2022-07-23 DIAGNOSIS — R269 Unspecified abnormalities of gait and mobility: Secondary | ICD-10-CM

## 2022-07-23 DIAGNOSIS — G35 Multiple sclerosis: Secondary | ICD-10-CM | POA: Diagnosis not present

## 2022-07-23 DIAGNOSIS — Z1231 Encounter for screening mammogram for malignant neoplasm of breast: Secondary | ICD-10-CM

## 2022-07-23 NOTE — Progress Notes (Signed)
[facility]  PATIENT: Savannah Morrow DOB: November 03, 1955  REFERRING DOCTOR OR PCP: Ashok Pall MD (neurosurgery); Garret Reddish, MD (PCP) SOURCE: Patient, notes from Dr. Christella Noa, imaging and lab reports, MRI images personally reviewed.  _________________________________   HISTORICAL  CHIEF COMPLAINT:  Chief Complaint  Patient presents with   Follow-up    Pt in room #2 and alone. Pt here for f/u and taking mavenclad for her MS.     HISTORY OF PRESENT ILLNESS:  Savannah Morrow is a 66 y.o. woman with multiple sclerosis.  Update 07/23/2022 She started [facility] late March and April 2023.   She felt swelling in her eye with the first week but steroids helped.    She tolerated it well otherwise.    The second month was uneentful.   She has done well since.   No infections or other issues.  Gai and balance are doing about the same or possibly slightly better.   She uses a cane for safety.   She had one fall in April.  She uses a bannister on stairs.   She tries to walk a mile a day as exercise and is active does so 4 times a week).    She gets some pins/needles in her toes that fluctuate and is not constant.  She has left facial numbness and tingling, worse in heat.   Vision is stable.   No color desaturation.   She has had cataract surgery.    She has had stress incontinence (cough/laugh) > 5 years .   She has only 1 x nocturia    Imipramine was not tolerated.   She has had a couple episodes of diarrhea.   .  She wears a left hearing aid x 2 years.    She has fatigue the past few months.     She notes symptoms are worse with heat and she likes gardening.      She is sleeping better.    She denies any cognitive issues.   She denies depression or anxiety.      She takes Vit D supplement  MS History She had lower cervical spina and right muscle spasms and pain and numbness in the right arm in C6 dermatome.   She also noted right  hip pain and sometimes the leg would almost give  out.   She had surgery May 2022.    She developed Covid-19 during 08/2021.  She felt weaker afterwards and had left neck pain.   She noted an electric shock sensation going down the left neck.     She felt better using an ice pack.     In January she began to have reduced balance and she had pain in the left jaw and left facial numbness.   She improved some but still had pain and had a follow-up MRI 12/09/2021.    It showed the ACDF and a focus c/w MS.    MRI of the brain also showed foci c/w MS.     She is referred for further evaluation and treatment  Several of her mother's cousins have MS.  Her sister had an AVM causing a cerebral hemorrhage.      She is fairly healthy with HTN and borderline DM.   She is trying to lose weight.     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  She had multiple CXR afterwards - all fine.   Last one 2010  Imaging review  MRI of the cervical spine 12/09/2021 shows a T2 hyperintense focus laterally to the right and the spinal cord adjacent to C7 and T1.   Additionally, there is a left posterolateral focus adjacent to C1-C2 that also appeared to be present on the 2022 MRI.   On the 2023 MRI, she is status post C5-C7 ACDF.  This study was compared to the MRI from 12/15/2020.  At that time, she had significant degenerative changes at C5-C6 and C6-C7 and also had spinal stenosis and foraminal narrowing at those levels.  The 2 probable demyelinating foci are present on this MRI.  MRI of the brain 12/09/2021 showed scattered T2/FLAIR hyperintense foci in the periventricular, juxtacortical and deep white matter of the hemispheres in a pattern compatible with multiple sclerosis.  There may be 1 small hyperintensity in the left basis pontis.  This study was done without contrast.    REVIEW OF SYSTEMS: Constitutional: No fevers, chills, sweats, or change in appetite Eyes: No visual changes, double vision, eye pain Ear, nose and throat: No hearing loss, ear pain, nasal  congestion, sore throat Cardiovascular: No chest pain, palpitations Respiratory:  No shortness of breath at rest or with exertion.   No wheezes GastrointestinaI: No nausea, vomiting, diarrhea, abdominal pain, fecal incontinence Genitourinary:  No dysuria, urinary retention or frequency.  No nocturia. Musculoskeletal:  No neck pain, back pain Integumentary: No rash, pruritus, skin lesions Neurological: as above Psychiatric: No depression at this time.  No anxiety Endocrine: No palpitations, diaphoresis, change in appetite, change in weigh or increased thirst Hematologic/Lymphatic:  No anemia, purpura, petechiae. Allergic/Immunologic: No itchy/runny eyes, nasal congestion, recent allergic reactions, rashes  ALLERGIES: Allergies  Allergen Reactions   Meperidine Hcl Anaphylaxis   Morphine    Percocet [Oxycodone-Acetaminophen]     rash   Sulfamethoxazole    Trimethoprim    Sulfamethoxazole-Trimethoprim Rash    HOME MEDICATIONS:  Current Outpatient Medications:    acetaminophen (TYLENOL) 650 MG CR tablet, Take 1,300 mg by mouth every 8 (eight) hours as needed for pain. Takes sparingly for her knees OTC, Disp: , Rfl:    atenolol-chlorthalidone (TENORETIC) 50-25 MG tablet, Take 1 tablet by mouth daily., Disp: 90 tablet, Rfl: 3   atorvastatin (LIPITOR) 20 MG tablet, Take 1 tablet (20 mg total) by mouth daily., Disp: 90 tablet, Rfl: 3   Cholecalciferol (VITAMIN D3) 50 MCG (2000 UT) capsule, Take by mouth daily., Disp: , Rfl:    Cladribine, 9 Tabs, (MAVENCLAD, 9 TABS,) 10 MG TBPK, Take 10 mg by mouth as directed. Month 1= 2 tablets PO on day 1-4. 1 tablet on day 5. Month 2= 2 tablets PO on day 1-3. 1 tablet on day 4-5. First dose of first cycle on 01/13/22., Disp: , Rfl:    fluticasone (FLONASE) 50 MCG/ACT nasal spray, Place 1 spray into both nostrils daily., Disp: 48 g, Rfl: 3   loratadine (CLARITIN) 10 MG tablet, Take 10 mg by mouth daily as needed., Disp: , Rfl:    Multiple  Vitamins-Minerals (MULTIVITAMIN ADULTS 50+ PO), Take 1 tablet by mouth daily., Disp: , Rfl:    potassium chloride SA (KLOR-CON M) 20 MEQ tablet, Take 1 tablet (20 mEq total) by mouth daily., Disp: 90 tablet, Rfl: 3  PAST MEDICAL HISTORY: Past Medical History:  Diagnosis Date   Allergy    Arthritis    knees   Crohn's disease ([place])    colon resection in 20. Dr. Carlean Purl unsure of diagnosis- no evidence on colonoscopy   Heart murmur  Hx of adenomatous colonic polyps 03/17/2018   Hyperlipidemia    Hypertension    Multiple sclerosis ([place])    Uterine cancer ([place]) 1989   s/p hysterectomy    PAST SURGICAL HISTORY: Past Surgical History:  Procedure Laterality Date   APPENDECTOMY     BREAST BIOPSY Left 03/2019   fatty tissue   COLON SURGERY  1978-79   COLONOSCOPY     OOPHORECTOMY     x1   total hysterectomy     WISDOM TOOTH EXTRACTION      FAMILY HISTORY: Family History  Problem Relation Age of Onset   Uterine cancer Mother    Alzheimer's disease Mother        died 2019/12/04  Diabetes Mother    Heart disease Father    Hypertension Father    Cancer Father        b cell lymphoma   CVA Father        passed from stroke 61   AVM Sister        57 years younger. brain. significant in hospital 6 weeks. Ltach/trach feeding tube   Breast cancer Maternal Grandmother    Diabetes Brother    Colon cancer Neg Hx    Esophageal cancer Neg Hx    Stomach cancer Neg Hx     SOCIAL HISTORY:  Social History   Socioeconomic History   Marital status: Married    Spouse name: Jori Moll   Number of children: 3   Years of education: Not on file   Highest education level: Bachelor's degree (e.g., BA, AB, BS)  Occupational History   Occupation: retired  Tobacco Use   Smoking status: Never   Smokeless tobacco: Never  Vaping Use   Vaping Use: Never used  Substance and Sexual Activity   Alcohol use: No   Drug use: No   Sexual activity: Yes  Other Topics Concern   Not on file   Social History Narrative   Married 47 years in 2020. 3 children. 7 grandchildren. 4 greatgrandchildren.    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, commissioning work 2010, last worked with [employer]   Hobbies: working in garden, camping in [place]   Right handed   Caffeine: zero   Social Determinants of Health   Financial Resource Strain: Low Risk  (06/02/2022)   Overall Financial Resource Strain (CARDIA)    Difficulty of Paying Living Expenses: Not hard at all  Food Insecurity: No Food Insecurity (06/02/2022)   Hunger Vital Sign    Worried About Running Out of Food in the Last Year: Never true    [place] in the Last Year: Never true  Transportation Needs: No Transportation Needs (06/02/2022)   PRAPARE - Hydrologist (Medical): No    Lack of Transportation (Non-Medical): No  Physical Activity: Sufficiently Active (06/02/2022)   Exercise Vital Sign    Days of Exercise per Week: 7 days    Minutes of Exercise per Session: 30 min  Stress: No Stress Concern Present (06/02/2022)   [place]    Feeling of Stress : Not at all  Social Connections: [place] (06/02/2022)   Social Connection and Isolation Panel [NHANES]    Frequency of Communication with Friends and Family: More than three times a week    Frequency of Social Gatherings with Friends and Family: Three times a week    Attends Religious Services: More than 4 times per year  Active Member of Clubs or Organizations: Yes    Attends Archivist Meetings: 1 to 4 times per year    Marital Status: Married  Human resources officer Violence: Not At Risk (06/02/2022)   Humiliation, Afraid, Rape, and Kick questionnaire    Fear of Current or Ex-Partner: No    Emotionally Abused: No    Physically Abused: No    Sexually Abused: No     PHYSICAL EXAM  Vitals:   07/23/22 0824  BP: 136/84  Pulse: 62  Weight: 207 lb (93.9 kg)  Height: 5' 2"  (1.575  m)    Body mass index is 37.86 kg/m.   General: The patient is well-developed and well-nourished and in no acute distress  HEENT:  Head is [place]/AT.  Sclera are anicteric.   Skin: Extremities are without rash or  edema.  Musculoskeletal:  Back and neck are nontender  Neurologic Exam  Mental status: The patient is alert and oriented x 3 at the time of the examination. The patient has apparent normal recent and remote memory, with an apparently normal attention span and concentration ability.   Speech is normal.  Cranial nerves: Extraocular movements are full. Pupils are equal, round, and reactive to light and accomodation.  Facial strength and sensation are fine . No obvious hearing deficits are noted.  Motor:  Muscle bulk is normal.   Tone is normal. Strength is  5 / 5 in all 4 extremities except 4+/5 riht iliopsoas and toes.   Sensory: Sensory testing is intact to pinprick, soft touch and vibration sensation in all 4 extremities.  Coordination: Cerebellar testing reveals good finger-nose-finger and reduced right heel-to-shin bilaterally.  Gait and station: Station is normal.   Gait is mildly wide and mildly reduced stride. . Tandem gait is poor. Turns unsteady 180 degrees in 4-5 steps.  Romberg is negative.   Reflexes: Deep tendon reflexes are symmetric and normal bilaterally in arms.       DIAGNOSTIC DATA (LABS, IMAGING, TESTING) - I reviewed patient records, labs, notes, testing and imaging myself where available.  Lab Results  Component Value Date   WBC 6.6 06/03/2022   HGB 14.0 06/03/2022   HCT 42.0 06/03/2022   MCV 89.0 06/03/2022   PLT 221.0 06/03/2022      Component Value Date/Time   NA 140 06/03/2022 1103   K 4.8 06/03/2022 1103   CL 98 06/03/2022 1103   CO2 32 06/03/2022 1103   GLUCOSE 95 06/03/2022 1103   GLUCOSE 101 (H) 10/22/2006 1217   BUN 21 06/03/2022 1103   CREATININE 0.77 06/03/2022 1103   CREATININE 0.79 07/31/2020 1031   CALCIUM 10.2 06/03/2022  1103   PROT 7.4 06/03/2022 1103   PROT 6.6 03/26/2022 0906   ALBUMIN 4.6 06/03/2022 1103   ALBUMIN 4.2 03/26/2022 0906   AST 17 06/03/2022 1103   ALT 14 06/03/2022 1103   ALKPHOS 78 06/03/2022 1103   BILITOT 0.9 06/03/2022 1103   BILITOT 0.5 03/26/2022 0906   GFRNONAA 78.90 01/04/2010 0813   GFRAA 84 10/26/2008 0949   Lab Results  Component Value Date   CHOL 132 06/03/2022   HDL 47.20 06/03/2022   LDLCALC 69 06/03/2022   LDLDIRECT 63.0 11/09/2018   TRIG 83.0 06/03/2022   CHOLHDL 3 06/03/2022   Lab Results  Component Value Date   HGBA1C 5.8 06/03/2022   No results found for: "VITAMINB12" Lab Results  Component Value Date   TSH 2.26 04/07/2016       ASSESSMENT AND PLAN  Multiple sclerosis ([place])  High risk medication use  Numbness  Gait disturbance  Vitamin D deficiency  Continue Mavenclad (next cycle will be around March 2024).  She had CBC/D and LFT at PCP office 6 weeks ago and lymphocytes    Stay active and exercise.  She eats well - no red meat, fresh food and vegetable.     Continue Vit D suppl.   Rtc 5 months, will recheck labs then     Atilano Covelli A. Felecia Shelling, MD, [facility] 88/12/2547, 8:26 AM Certified in Neurology, Clinical Neurophysiology, Sleep Medicine and Neuroimaging  [facility] [address] [place],  [address] [phone]

## 2022-07-25 ENCOUNTER — Encounter: Payer: Self-pay | Admitting: Family Medicine

## 2022-08-05 ENCOUNTER — Other Ambulatory Visit (HOSPITAL_COMMUNITY): Payer: Self-pay

## 2022-08-19 ENCOUNTER — Ambulatory Visit
Admission: RE | Admit: 2022-08-19 | Discharge: 2022-08-19 | Disposition: A | Discharge status: Home / Self Care | Payer: Medicare Other | Source: Ambulatory Visit | Attending: Family Medicine | Admitting: Family Medicine

## 2022-08-19 DIAGNOSIS — Z1231 Encounter for screening mammogram for malignant neoplasm of breast: Secondary | ICD-10-CM

## 2022-10-28 ENCOUNTER — Telehealth: Payer: Self-pay | Admitting: Neurology

## 2022-10-28 NOTE — Telephone Encounter (Signed)
Addy is calling from [employer].  Wanting to know if faxed have been received for [facility] and to let the nurse know the window have open.

## 2022-10-28 NOTE — Telephone Encounter (Signed)
Patient is not due for her second year of [place] until March 2024. She has an upcoming appointment with Dr. Felecia Shelling.

## 2022-12-03 ENCOUNTER — Ambulatory Visit (INDEPENDENT_AMBULATORY_CARE_PROVIDER_SITE_OTHER): Payer: Medicare Other | Admitting: Neurology

## 2022-12-03 ENCOUNTER — Encounter: Payer: Self-pay | Admitting: Neurology

## 2022-12-03 ENCOUNTER — Telehealth: Payer: Self-pay | Admitting: Neurology

## 2022-12-03 VITALS — BP 131/59 | HR 56 | Ht 62.0 in | Wt 209.5 lb

## 2022-12-03 DIAGNOSIS — G35 Multiple sclerosis: Secondary | ICD-10-CM | POA: Diagnosis not present

## 2022-12-03 DIAGNOSIS — Z79899 Other long term (current) drug therapy: Secondary | ICD-10-CM

## 2022-12-03 DIAGNOSIS — R200 Anesthesia of skin: Secondary | ICD-10-CM

## 2022-12-03 DIAGNOSIS — R269 Unspecified abnormalities of gait and mobility: Secondary | ICD-10-CM | POA: Diagnosis not present

## 2022-12-03 NOTE — Telephone Encounter (Signed)
[employer] medicare NPR sent to [employer] [phone]

## 2022-12-03 NOTE — Progress Notes (Signed)
[facility]  PATIENT: Savannah Morrow DOB: [birth year]  REFERRING DOCTOR OR PCP: Ashok Pall MD (neurosurgery); Garret Reddish, MD (PCP) SOURCE: Patient, notes from Dr. Christella Noa, imaging and lab reports, MRI images personally reviewed.  _________________________________   HISTORICAL  CHIEF COMPLAINT:  Chief Complaint  Patient presents with   Room 11    Pt is here Alone. Pt states that everyday is a new adventure.     HISTORY OF PRESENT ILLNESS:  Savannah Morrow is a 67 y.o. woman with multiple sclerosis.  Update 07/23/2022 She did her 1st year of [place] late March and late April 2023.   She has some swelling sensations helped by a steroid pack the first cycle.       The second month was uneentful.   She has done well since.   No infections or other issues.   CBC/D showed a lymphocyte count of 0.5 at 2 months ad 0.7 at 6 month     Balance is poor and gait is wide.   She had one fall in 2023.   She uses a cane for safety.  She uses a bannister on stairs.   She tries to walk a mile a day as exercise and does > 5000 steps a day.    She gets some pins/needles in her toes that fluctuate and is not constant.  She has left facial numbness and tingling, worse in heat.     Vision is stable.   No color desaturation.  She has left eye discomfort and is going to see Dr. Katy Fitch. Pain is helped by ice.    She has had stress incontinence (cough/laugh) for many years.  Tis is a little beter,   She has less nocturia    Imipramine was not tolerated.       Marland Kitchen  She wears a left hearing aid x 2 years.    She has fatigue the past few months.     She notes symptoms are worse with heat and she likes gardening.      She is sleeping better.    She denies any cognitive issues.   She notes no depression or anxiety.      She takes Vit D supplement  MS History She had lower cervical spina and right muscle spasms and pain and numbness in the right arm in C6 dermatome.   She also noted right  hip  pain and sometimes the leg would almost give out.   She had surgery May 2022.    She developed Covid-19 during 08/2021.  She felt weaker afterwards and had left neck pain.   She noted an electric shock sensation going down the left neck.     She felt better using an ice pack.     In January she began to have reduced balance and she had pain in the left jaw and left facial numbness.   She improved some but still had pain and had a follow-up MRI 12/09/2021.    It showed the ACDF and a focus c/w MS.    MRI of the brain also showed foci c/w MS.     She is referred for further evaluation and treatment  Several of her mother's cousins have MS.  Her sister had an AVM causing a cerebral hemorrhage.      She is fairly healthy with HTN and borderline DM.   She is trying to lose weight.     She had a TB exposure and had a positive  skin TB test.  She had multiple CXR afterwards - all fine.   Last one 2010  Imaging review  MRI of the cervical spine 12/09/2021 shows a T2 hyperintense focus laterally to the right and the spinal cord adjacent to C7 and T1.   Additionally, there is a left posterolateral focus adjacent to C1-C2 that also appeared to be present on the 2022 MRI.   On the 2023 MRI, she is status post C5-C7 ACDF.  This study was compared to the MRI from 12/15/2020.  At that time, she had significant degenerative changes at C5-C6 and C6-C7 and also had spinal stenosis and foraminal narrowing at those levels.  The 2 probable demyelinating foci are present on this MRI.  MRI of the brain 12/09/2021 showed scattered T2/FLAIR hyperintense foci in the periventricular, juxtacortical and deep white matter of the hemispheres in a pattern compatible with multiple sclerosis.  There may be 1 small hyperintensity in the left basis pontis.  This study was done without contrast.    REVIEW OF SYSTEMS: Constitutional: No fevers, chills, sweats, or change in appetite Eyes: No visual changes, double vision, eye pain Ear, nose  and throat: No hearing loss, ear pain, nasal congestion, sore throat Cardiovascular: No chest pain, palpitations Respiratory:  No shortness of breath at rest or with exertion.   No wheezes GastrointestinaI: No nausea, vomiting, diarrhea, abdominal pain, fecal incontinence Genitourinary:  No dysuria, urinary retention or frequency.  No nocturia. Musculoskeletal:  No neck pain, back pain Integumentary: No rash, pruritus, skin lesions Neurological: as above Psychiatric: No depression at this time.  No anxiety Endocrine: No palpitations, diaphoresis, change in appetite, change in weigh or increased thirst Hematologic/Lymphatic:  No anemia, purpura, petechiae. Allergic/Immunologic: No itchy/runny eyes, nasal congestion, recent allergic reactions, rashes  ALLERGIES: Allergies  Allergen Reactions   Meperidine Hcl Anaphylaxis   Morphine    Percocet [Oxycodone-Acetaminophen]     rash   Sulfamethoxazole    Trimethoprim    Sulfamethoxazole-Trimethoprim Rash    HOME MEDICATIONS:  Current Outpatient Medications:    acetaminophen (TYLENOL) 650 MG CR tablet, Take 1,300 mg by mouth every 8 (eight) hours as needed for pain. Takes sparingly for her knees OTC, Disp: , Rfl:    atenolol-chlorthalidone (TENORETIC) 50-25 MG tablet, Take 1 tablet by mouth daily., Disp: 90 tablet, Rfl: 3   atorvastatin (LIPITOR) 20 MG tablet, Take 1 tablet (20 mg total) by mouth daily., Disp: 90 tablet, Rfl: 3   Cholecalciferol (VITAMIN D3) 50 MCG (2000 UT) capsule, Take by mouth daily., Disp: , Rfl:    Cladribine, 9 Tabs, (MAVENCLAD, 9 TABS,) 10 MG TBPK, Take 10 mg by mouth as directed. Month 1= 2 tablets PO on day 1-4. 1 tablet on day 5. Month 2= 2 tablets PO on day 1-3. 1 tablet on day 4-5. First dose of first cycle on 01/13/22., Disp: , Rfl:    fluticasone (FLONASE) 50 MCG/ACT nasal spray, Place 1 spray into both nostrils daily., Disp: 48 g, Rfl: 3   loratadine (CLARITIN) 10 MG tablet, Take 10 mg by mouth daily as  needed., Disp: , Rfl:    Multiple Vitamins-Minerals (MULTIVITAMIN ADULTS 50+ PO), Take 1 tablet by mouth daily., Disp: , Rfl:    potassium chloride SA (KLOR-CON M) 20 MEQ tablet, Take 1 tablet (20 mEq total) by mouth daily., Disp: 90 tablet, Rfl: 3  PAST MEDICAL HISTORY: Past Medical History:  Diagnosis Date   Allergy    Arthritis    knees   Crohn's disease ([place])  colon resection in 20. Dr. Carlean Purl unsure of diagnosis- no evidence on colonoscopy   Heart murmur    Hx of adenomatous colonic polyps 03/17/2018   Hyperlipidemia    Hypertension    Multiple sclerosis ([place])    Uterine cancer ([place]) 1989   s/p hysterectomy    PAST SURGICAL HISTORY: Past Surgical History:  Procedure Laterality Date   APPENDECTOMY     BREAST BIOPSY Left 03/2019   fatty tissue   COLON SURGERY  1978-79   COLONOSCOPY     OOPHORECTOMY     x1   total hysterectomy     WISDOM TOOTH EXTRACTION      FAMILY HISTORY: Family History  Problem Relation Age of Onset   Uterine cancer Mother    Alzheimer's disease Mother        died November 24, 2019  Diabetes Mother    Heart disease Father    Hypertension Father    Cancer Father        b cell lymphoma   CVA Father        passed from stroke 34   AVM Sister        31 years younger. brain. significant in hospital 6 weeks. Ltach/trach feeding tube   Breast cancer Maternal Grandmother    Diabetes Brother    Colon cancer Neg Hx    Esophageal cancer Neg Hx    Stomach cancer Neg Hx     SOCIAL HISTORY:  Social History   Socioeconomic History   Marital status: Married    Spouse name: Jori Moll   Number of children: 3   Years of education: Not on file   Highest education level: Bachelor's degree (e.g., BA, AB, BS)  Occupational History   Occupation: retired  Tobacco Use   Smoking status: Never   Smokeless tobacco: Never  Vaping Use   Vaping Use: Never used  Substance and Sexual Activity   Alcohol use: No   Drug use: No   Sexual activity: Yes  Other  Topics Concern   Not on file  Social History Narrative   Married 47 years in 2020. 3 children. 7 grandchildren. 4 greatgrandchildren.    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, commissioning work 2010, last worked with [employer]   Hobbies: working in garden, camping in [place]   Right handed   Caffeine: zero   Social Determinants of Health   Financial Resource Strain: Low Risk  (06/02/2022)   Overall Financial Resource Strain (CARDIA)    Difficulty of Paying Living Expenses: Not hard at all  Food Insecurity: No Food Insecurity (06/02/2022)   Hunger Vital Sign    Worried About Running Out of Food in the Last Year: Never true    Mandan in the Last Year: Never true  Transportation Needs: No Transportation Needs (06/02/2022)   PRAPARE - Hydrologist (Medical): No    Lack of Transportation (Non-Medical): No  Physical Activity: Sufficiently Active (06/02/2022)   Exercise Vital Sign    Days of Exercise per Week: 7 days    Minutes of Exercise per Session: 30 min  Stress: No Stress Concern Present (06/02/2022)   [place]    Feeling of Stress : Not at all  Social Connections: [place] (06/02/2022)   Social Connection and Isolation Panel [NHANES]    Frequency of Communication with Friends and Family: More than three times a week    Frequency of Social Gatherings with  Friends and Family: Three times a week    Attends Religious Services: More than 4 times per year    Active Member of Clubs or Organizations: Yes    Attends Archivist Meetings: 1 to 4 times per year    Marital Status: Married  Human resources officer Violence: Not At Risk (06/02/2022)   Humiliation, Afraid, Rape, and Kick questionnaire    Fear of Current or Ex-Partner: No    Emotionally Abused: No    Physically Abused: No    Sexually Abused: No     PHYSICAL EXAM  Vitals:   12/03/22 1426  BP: (!) 131/59  Pulse: (!) 56  Weight:  209 lb 8 oz (95 kg)  Height: 5' 2"$  (1.575 m)    Body mass index is 38.32 kg/m.   General: The patient is well-developed and well-nourished and in no acute distress  HEENT:  Head is [place]/AT.  Sclera are anicteric.   Skin: Extremities are without rash or  edema.  Musculoskeletal:  Back and neck are nontender  Neurologic Exam  Mental status: The patient is alert and oriented x 3 at the time of the examination. The patient has apparent normal recent and remote memory, with an apparently normal attention span and concentration ability.   Speech is normal.  Cranial nerves: Extraocular movements are full. Pupils are equal, round, and reactive to light and accomodation.  Facial strength and sensation are fine . No obvious hearing deficits are noted.  Motor:  Muscle bulk is normal.   Tone is normal. Strength is  5 / 5 in all 4 extremities except 4+/5 riht iliopsoas and toes.   Sensory: Sensory testing is intact to pinprick, soft touch and vibration sensation in all 4 extremities.  Coordination: Cerebellar testing reveals good finger-nose-finger and reduced right heel-to-shin bilaterally.  Gait and station: Station is normal.   Gait is mildly wide and mildly reduced stride. . Tandem gait is wide and turn is off balanced.  Romberg is negative.   Reflexes: Deep tendon reflexes are symmetric and normal bilaterally in arms.       DIAGNOSTIC DATA (LABS, IMAGING, TESTING) - I reviewed patient records, labs, notes, testing and imaging myself where available.  Lab Results  Component Value Date   WBC 6.6 06/03/2022   HGB 14.0 06/03/2022   HCT 42.0 06/03/2022   MCV 89.0 06/03/2022   PLT 221.0 06/03/2022      Component Value Date/Time   NA 140 06/03/2022 1103   K 4.8 06/03/2022 1103   CL 98 06/03/2022 1103   CO2 32 06/03/2022 1103   GLUCOSE 95 06/03/2022 1103   GLUCOSE 101 (H) 10/22/2006 1217   BUN 21 06/03/2022 1103   CREATININE 0.77 06/03/2022 1103   CREATININE 0.79 07/31/2020 1031    CALCIUM 10.2 06/03/2022 1103   PROT 7.4 06/03/2022 1103   PROT 6.6 03/26/2022 0906   ALBUMIN 4.6 06/03/2022 1103   ALBUMIN 4.2 03/26/2022 0906   AST 17 06/03/2022 1103   ALT 14 06/03/2022 1103   ALKPHOS 78 06/03/2022 1103   BILITOT 0.9 06/03/2022 1103   BILITOT 0.5 03/26/2022 0906   GFRNONAA 78.90 01/04/2010 0813   GFRAA 84 10/26/2008 0949   Lab Results  Component Value Date   CHOL 132 06/03/2022   HDL 47.20 06/03/2022   LDLCALC 69 06/03/2022   LDLDIRECT 63.0 11/09/2018   TRIG 83.0 06/03/2022   CHOLHDL 3 06/03/2022   Lab Results  Component Value Date   HGBA1C 5.8 06/03/2022   No  results found for: "VITAMINB12" Lab Results  Component Value Date   TSH 2.26 04/07/2016       ASSESSMENT AND PLAN  Multiple sclerosis ([place]) - Plan: Hepatitis B surface antigen, HIV Antibody (routine testing w rflx), QuantiFERON-TB Gold Plus, Hepatitis B surface antibody,qualitative, Hepatitis B core antibody, total, Comprehensive metabolic panel, CBC with Differential/Platelet, MR BRAIN WO CONTRAST  High risk medication use - Plan: Hepatitis B surface antigen, HIV Antibody (routine testing w rflx), QuantiFERON-TB Gold Plus, Hepatitis B surface antibody,qualitative, Hepatitis B core antibody, total, Comprehensive metabolic panel, CBC with Differential/Platelet  Gait disturbance  Numbness  Continue Mavenclad (next cycle will be around March 2024).   Check labs for the second year Stay active and exercise.  She eats well - Mediterranean diet.     Continue Vit D suppl.   Rtc 3 months, will recheck labs then     Piera Downs A. Felecia Shelling, MD, [facility] [id number], [id number] PM Certified in Neurology, Clinical Neurophysiology, Sleep Medicine and Neuroimaging  [facility] [address] [place], [place] [address] [phone]

## 2022-12-07 ENCOUNTER — Ambulatory Visit (INDEPENDENT_AMBULATORY_CARE_PROVIDER_SITE_OTHER): Payer: Medicare Other

## 2022-12-07 DIAGNOSIS — G35 Multiple sclerosis: Secondary | ICD-10-CM

## 2022-12-08 ENCOUNTER — Telehealth: Payer: Self-pay

## 2022-12-08 LAB — COMPREHENSIVE METABOLIC PANEL
ALT: 16 IU/L (ref 0–32)
AST: 19 IU/L (ref 0–40)
Albumin/Globulin Ratio: 1.7 (ref 1.2–2.2)
Albumin: 4.4 g/dL (ref 3.9–4.9)
Alkaline Phosphatase: 95 IU/L (ref 44–121)
BUN/Creatinine Ratio: 16 (ref 12–28)
BUN: 11 mg/dL (ref 8–27)
Bilirubin Total: 0.6 mg/dL (ref 0.0–1.2)
CO2: 27 mmol/L (ref 20–29)
Calcium: 9.9 mg/dL (ref 8.7–10.3)
Chloride: 103 mmol/L (ref 96–106)
Creatinine, Ser: 0.7 mg/dL (ref 0.57–1.00)
Globulin, Total: 2.6 g/dL (ref 1.5–4.5)
Glucose: 101 mg/dL — ABNORMAL HIGH (ref 70–99)
Potassium: 4.5 mmol/L (ref 3.5–5.2)
Sodium: 144 mmol/L (ref 134–144)
Total Protein: 7 g/dL (ref 6.0–8.5)
eGFR: 94 mL/min/{1.73_m2} (ref >59)

## 2022-12-08 LAB — QUANTIFERON-TB GOLD PLUS
QuantiFERON Mitogen Value: >10 IU/mL
QuantiFERON Nil Value: 0.03 IU/mL
QuantiFERON TB1 Ag Value: 0.07 IU/mL
QuantiFERON TB2 Ag Value: 0.06 IU/mL
QuantiFERON-TB Gold Plus: NEGATIVE

## 2022-12-08 LAB — CBC WITH DIFFERENTIAL/PLATELET
Basophils Absolute: 0 10*3/uL (ref 0.0–0.2)
Basos: 1 %
EOS (ABSOLUTE): 0.7 10*3/uL — ABNORMAL HIGH (ref 0.0–0.4)
Eos: 11 %
Hematocrit: 42.2 % (ref 34.0–46.6)
Hemoglobin: 13.9 g/dL (ref 11.1–15.9)
Immature Grans (Abs): 0 10*3/uL (ref 0.0–0.1)
Immature Granulocytes: 0 %
Lymphocytes Absolute: 0.8 10*3/uL (ref 0.7–3.1)
Lymphs: 13 %
MCH: 28.4 pg (ref 26.6–33.0)
MCHC: 32.9 g/dL (ref 31.5–35.7)
MCV: 86 fL (ref 79–97)
Monocytes Absolute: 0.6 10*3/uL (ref 0.1–0.9)
Monocytes: 10 %
Neutrophils Absolute: 4.1 10*3/uL (ref 1.4–7.0)
Neutrophils: 65 %
Platelets: 220 10*3/uL (ref 150–450)
RBC: 4.89 x10E6/uL (ref 3.77–5.28)
RDW: 12.1 % (ref 11.7–15.4)
WBC: 6.2 10*3/uL (ref 3.4–10.8)

## 2022-12-08 LAB — HEPATITIS B CORE ANTIBODY, TOTAL: Hep B Core Total Ab: NEGATIVE

## 2022-12-08 LAB — HEPATITIS B SURFACE ANTIBODY,QUALITATIVE: Hep B Surface Ab, Qual: NONREACTIVE

## 2022-12-08 LAB — HEPATITIS B SURFACE ANTIGEN: Hepatitis B Surface Ag: NEGATIVE

## 2022-12-08 LAB — HIV ANTIBODY (ROUTINE TESTING W REFLEX): HIV Screen 4th Generation wRfx: NONREACTIVE

## 2022-12-08 NOTE — Telephone Encounter (Signed)
Please see message from today 12/08/2022 @ 10:55

## 2022-12-09 ENCOUNTER — Other Ambulatory Visit (HOSPITAL_COMMUNITY): Payer: Self-pay

## 2022-12-09 ENCOUNTER — Ambulatory Visit (INDEPENDENT_AMBULATORY_CARE_PROVIDER_SITE_OTHER): Payer: Medicare Other | Admitting: Family Medicine

## 2022-12-09 ENCOUNTER — Encounter: Payer: Self-pay | Admitting: Family Medicine

## 2022-12-09 VITALS — BP 122/72 | HR 62 | Temp 97.0°F | Ht 62.0 in | Wt 206.6 lb

## 2022-12-09 DIAGNOSIS — G35 Multiple sclerosis: Secondary | ICD-10-CM | POA: Diagnosis not present

## 2022-12-09 DIAGNOSIS — R739 Hyperglycemia, unspecified: Secondary | ICD-10-CM

## 2022-12-09 DIAGNOSIS — I10 Essential (primary) hypertension: Secondary | ICD-10-CM

## 2022-12-09 DIAGNOSIS — E785 Hyperlipidemia, unspecified: Secondary | ICD-10-CM | POA: Diagnosis not present

## 2022-12-09 DIAGNOSIS — K50919 Crohn's disease, unspecified, with unspecified complications: Secondary | ICD-10-CM

## 2022-12-09 DIAGNOSIS — L750 Bromhidrosis: Secondary | ICD-10-CM | POA: Diagnosis not present

## 2022-12-09 LAB — POCT GLYCOSYLATED HEMOGLOBIN (HGB A1C): Hemoglobin A1C: 5.4 % (ref 4.0–5.6)

## 2022-12-09 MED ORDER — MUPIROCIN 2 % EX OINT
1.0000 | TOPICAL_OINTMENT | Freq: Two times a day (BID) | CUTANEOUS | 0 refills | Status: DC
  Filled 2022-12-09: qty 22, 11d supply, fill #0

## 2022-12-09 NOTE — Progress Notes (Signed)
Phone [phone] In person visit   Subjective:   Savannah Morrow is a 67 y.o. year old very pleasant female patient who presents for/with See problem oriented charting Chief Complaint  Patient presents with   Follow-up    (MASK)   Hypertension   Hyperlipidemia   Past Medical History-  Patient Active Problem List   Diagnosis Date Noted   Multiple sclerosis ([place]) 12/23/2021    Priority: High   Morbid obesity ([place]) 03/07/2011    Priority: High   Vitamin D deficiency 12/23/2021    Priority: Medium    Osteopenia 05/21/2020    Priority: Medium    Hx of adenomatous colonic polyps 03/17/2018    Priority: Medium    Family history of cerebrovascular accident (CVA) in sister 10/06/2017    Priority: Medium    Hyperglycemia 04/07/2016    Priority: Medium    Crohn's disease ([place])     Priority: Medium    Hyperlipidemia 06/02/2007    Priority: Medium    Essential hypertension 06/02/2007    Priority: Medium    S/P cervical spinal fusion 12/23/2021    Priority: Low   Hearing loss 11/11/2019    Priority: Low   S/P total hysterectomy 04/14/2018    Priority: Low   Chest pain 03/30/2014    Priority: Low   Osteoarthritis of both knees 01/10/2010    Priority: Low   Edema 01/10/2010    Priority: Low   Allergic rhinitis 06/02/2007    Priority: Low   Numbness 03/26/2022   Gait disturbance 03/26/2022   High risk medication use 12/23/2021   Occipital neuralgia of left side 12/23/2021   Spinal stenosis in cervical region 12/18/2020    Medications- reviewed and updated Current Outpatient Medications  Medication Sig Dispense Refill   atenolol-chlorthalidone (TENORETIC) 50-25 MG tablet Take 1 tablet by mouth daily. 90 tablet 3   atorvastatin (LIPITOR) 20 MG tablet Take 1 tablet (20 mg total) by mouth daily. 90 tablet 3   Cholecalciferol (VITAMIN D3) 50 MCG (2000 UT) capsule Take by mouth daily.     Cladribine, 9 Tabs, (MAVENCLAD, 9 TABS,) 10 MG TBPK Take 10 mg by mouth as directed.  Month 1= 2 tablets PO on day 1-4. 1 tablet on day 5. Month 2= 2 tablets PO on day 1-3. 1 tablet on day 4-5. First dose of first cycle on 01/13/22.     fluticasone (FLONASE) 50 MCG/ACT nasal spray Place 1 spray into both nostrils daily. 48 g 3   loratadine (CLARITIN) 10 MG tablet Take 10 mg by mouth daily as needed.     Multiple Vitamins-Minerals (MULTIVITAMIN ADULTS 50+ PO) Take 1 tablet by mouth daily.     mupirocin ointment (BACTROBAN) 2 % Apply 1 Application topically 2 (two) times daily. 22 g 0   potassium chloride SA (KLOR-CON M) 20 MEQ tablet Take 1 tablet (20 mEq total) by mouth daily. 90 tablet 3   No current facility-administered medications for this visit.     Objective:  BP 122/72   Pulse 62   Temp (!) 97 F (36.1 C)   Ht 5' 2"$  (1.575 m)   Wt 206 lb 9.6 oz (93.7 kg)   LMP  (LMP Unknown)   SpO2 98%   BMI 37.79 kg/m  Gen: NAD, resting comfortably Oropharynx normal, inside nares mild erythema CV: RRR no murmurs rubs or gallops Lungs: CTAB no crackles, wheeze, rhonchi Ext: no edema Skin: warm, dry    Assessment and Plan   #Multiple sclerosis-originally diagnosed  after MRI every 20 2023 of cervical spine with MRI of the brain with foci consistent with MS.  Following closely with Dr. Felecia Shelling.  On Mavenclad-next cycle planned March 2024. MRI results pending- hoping for stability  -remains very active -continue current medications   #hypertension S: medication: atenolol-chlorthalidone 50-25Mg BP Readings from Last 3 Encounters:  12/09/22 122/72  12/03/22 (!) 131/59  07/23/22 136/84  A/P: stable/controlled- continue current medicines   #hyperlipidemia S: Medication: Atorvastatin 20Mg Lab Results  Component Value Date   CHOL 132 06/03/2022   HDL 47.20 06/03/2022   LDLCALC 69 06/03/2022   LDLDIRECT 63.0 11/09/2018   TRIG 83.0 06/03/2022   CHOLHDL 3 06/03/2022   A/P: stable/controlled- continue current medicines   # Hyperglycemia/insulin resistance/prediabetes-  peak a1c of 5.8.  #morbid obesity-peak weight 237 S:  Medication: none Exercise and diet- pushing herself daily exercising- chair yoga and balance exercises, weight stable- has done a great job keeping weight down Lab Results  Component Value Date   HGBA1C 5.4 12/09/2022   HGBA1C 5.8 06/03/2022   HGBA1C 5.6 12/02/2021   A/P: prediabetes - poc a1c 5.4- will continue to monitor and continue exercise/healthy eating For morbid obesity (hypertension and HLD) - down over 30 lbs from peak- continue current medications    #crohn's disease S:  Colon resection in 20s. Dr. Carlean Purl is not certain this is acurate diagnosis. No evidence on repeat colonoscopy. No blood in stool or abdominal cramping recently  A/P: has done well- continue without meds -colonoscopy will likely get pushed due to [facility] possibly to end of year  #Mild soreness inside both nares- triple antibiotic didn't help- then mixed peroxide and water and not healing up . No running nose or cold. Since about Christmas. Notes at bed at night. Nasal saline no help. Tried off loratadine without relief. No flonase. Cool mist humidifer in bedroom. Tried neti pot. Will crust at times -inside nostril is definitely red and irritated- question staph infection- trial mupirocin twice a day for 2 weeks- if not improving asked her to let me know so I can refer to ENT   # urinary odor S:in last 3 months has noted more musty odor. Worse if doesn't stay hydrated.  Usually drinks 60 oz at least. No dysuria. No frequency.  A/P: unclear cause- with immunosuppression check UA and urine culture- she will come back for this   Recommended follow up: Return in about 6 months (around 06/09/2023) for physical or sooner if needed.Schedule b4 you leave. Future Appointments  Date Time Provider [place]  03/11/2023  2:00 PM Sater, Nanine Means, MD [facility] None  06/05/2023  1:00 PM [facility] HEALTH COACH [facility]   Lab/Order associations:   ICD-10-CM   1.  Essential hypertension  I10     2. Hyperglycemia  R73.9 POCT HgB A1C    3. Crohn's disease with complication, unspecified gastrointestinal tract location [facility]) Chronic K50.919     4. Morbid obesity ([facility]) Chronic E66.01     5. Hyperlipidemia, unspecified hyperlipidemia type  E78.5     6. Multiple sclerosis ([place])  G35     7. Urinary body odor  L75.0 Urinalysis, Routine w reflex microscopic    Urine Culture      Meds ordered this encounter  Medications   mupirocin ointment (BACTROBAN) 2 %    Sig: Apply 1 Application topically 2 (two) times daily.    Dispense:  22 g    Refill:  0    Return precautions advised.  Annie Main  Yong Channel, MD

## 2022-12-09 NOTE — Patient Instructions (Addendum)
Schedule to come back for labs to give urine  -inside nostril is definitely red and irritated- question staph infection- trial mupirocin twice a day for 2 weeks- if not improving asked her to let me know so I can refer to ENT   Keep up great work on exercise, healthy eating, remaining active despite your obstacles  Recommended follow up: Return in about 6 months (around 06/09/2023) for physical or sooner if needed.Schedule b4 you leave.

## 2022-12-10 ENCOUNTER — Other Ambulatory Visit (INDEPENDENT_AMBULATORY_CARE_PROVIDER_SITE_OTHER): Payer: Medicare Other

## 2022-12-10 ENCOUNTER — Encounter: Payer: Self-pay | Admitting: Family Medicine

## 2022-12-10 DIAGNOSIS — R8290 Unspecified abnormal findings in urine: Secondary | ICD-10-CM | POA: Diagnosis not present

## 2022-12-10 DIAGNOSIS — L750 Bromhidrosis: Secondary | ICD-10-CM

## 2022-12-10 LAB — URINALYSIS, ROUTINE W REFLEX MICROSCOPIC
Bilirubin Urine: NEGATIVE
Hgb urine dipstick: NEGATIVE
Ketones, ur: NEGATIVE
Leukocytes,Ua: NEGATIVE
Nitrite: NEGATIVE
RBC / HPF: NONE SEEN (ref 0–?)
Specific Gravity, Urine: <=1.005 — AB (ref 1.000–1.030)
Total Protein, Urine: NEGATIVE
Urine Glucose: NEGATIVE
Urobilinogen, UA: 0.2 (ref 0.0–1.0)
WBC, UA: NONE SEEN (ref 0–?)
pH: 6.5 (ref 5.0–8.0)

## 2022-12-10 NOTE — Telephone Encounter (Signed)
Faxed completed/signed Mavenclad start form to [facility] at [phone]. Received fax confirmation.   Pt cleared to start therapy per Dr. Felecia Shelling.

## 2022-12-10 NOTE — Telephone Encounter (Signed)
See below

## 2022-12-11 LAB — UNLABELED: Test Ordered On Req: 395

## 2022-12-11 MED ORDER — CEPHALEXIN 500 MG PO CAPS
500.0000 mg | ORAL_CAPSULE | Freq: Three times a day (TID) | ORAL | 0 refills | Status: AC
  Filled 2022-12-11: qty 21, 7d supply, fill #0

## 2022-12-12 ENCOUNTER — Other Ambulatory Visit (HOSPITAL_COMMUNITY): Payer: Self-pay

## 2022-12-15 NOTE — Telephone Encounter (Signed)
Attempted PA for Mavenclad via [place]. Sent to [place]. Key: [id number]. Received this message: "This medication or product was previously approved on [id number] from 2022-10-20 to 2023-10-20. **Please note: This request was submitted electronically. Formulary lowering, tiering exception, cost reduction and/or pre-benefit determination review (including prospective Medicare hospice reviews) requests cannot be requested using this method of submission. Providers contact us at [phone] for further assistance.

## 2022-12-16 NOTE — Telephone Encounter (Signed)
Mupirocin Added to allergy list.

## 2022-12-17 LAB — PAT ID TIQ DOC

## 2022-12-17 LAB — URINE CULTURE
MICRO NUMBER:: 14620527
Result:: NO GROWTH
SPECIMEN QUALITY:: ADEQUATE

## 2022-12-17 NOTE — Telephone Encounter (Signed)
I called [facility].  I spoke with Addie.  I reported that there is a PA on file for [facility].  Addie reports they spoke with the patient on Friday and they are working to get her drug to her.

## 2023-01-08 ENCOUNTER — Encounter: Payer: Self-pay | Admitting: Neurology

## 2023-02-09 ENCOUNTER — Encounter: Payer: Self-pay | Admitting: Neurology

## 2023-03-01 IMAGING — MR MR CERVICAL SPINE W/O CM
5 series · 31 of 48 positions shown · non-contrast
Comparison: Cervical spine MRI 12/15/2020

CLINICAL DATA: Left-sided neck pain and swelling. Other cervical
disc displacement at C5-6 level.

EXAM:
MRI HEAD WITHOUT CONTRAST
MRI CERVICAL SPINE WITHOUT CONTRAST
TECHNIQUE: Multiplanar, multiecho pulse sequences of the brain and surrounding
structures, and cervical spine, to include the craniocervical
junction and cervicothoracic junction, were obtained without
intravenous contrast.

[Series 2: T2 · sagittal · 3.0mm · 0.86mm/px · 6 of 15 slices shown (1 of 2)]
[im 1/15]
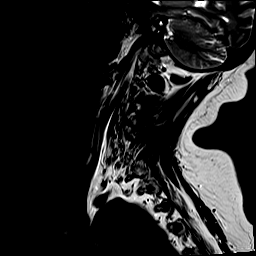
[im 3/15]
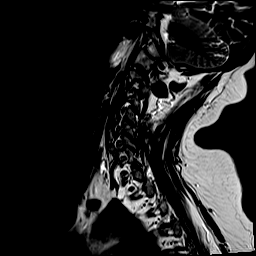
[im 6/15]
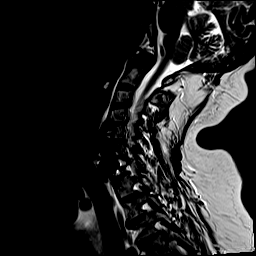
[im 9/15]
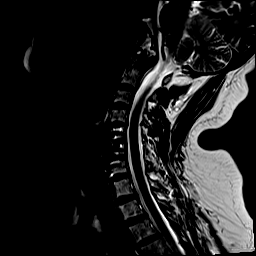
[im 12/15]
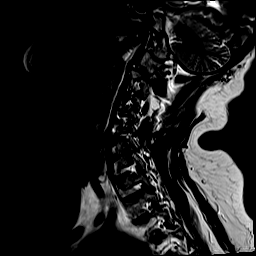
[im 15/15]
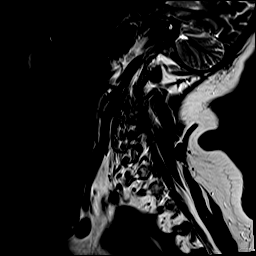

[Series 3: T1 · sagittal · 3.0mm · 0.86mm/px · 7 of 15 slices shown]
[im 1/15]
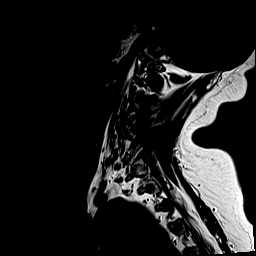
[im 3/15]
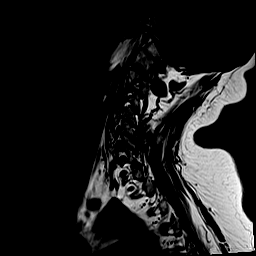
[im 5/15]
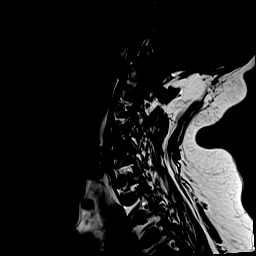
[im 8/15]
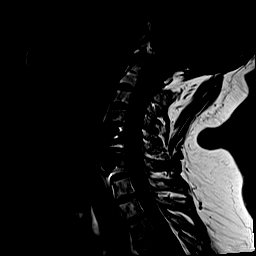
[im 10/15]
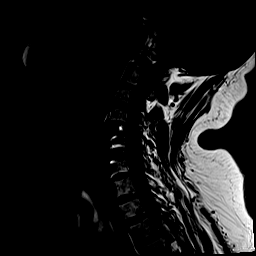
[im 12/15]
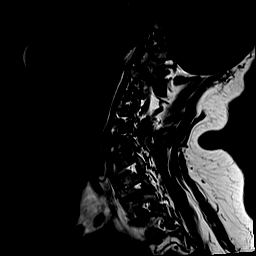
[im 15/15]
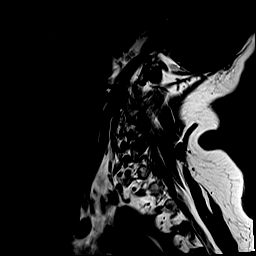

[Series 4: STIR · sagittal · 3.0mm · 0.69mm/px · 7 of 15 slices shown]
[im 1/15]
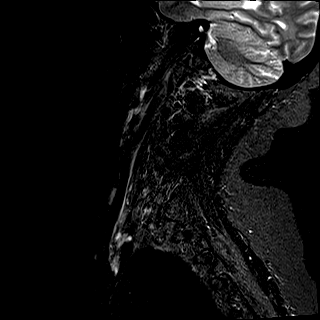
[im 3/15]
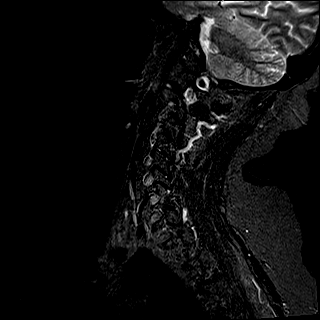
[im 5/15]
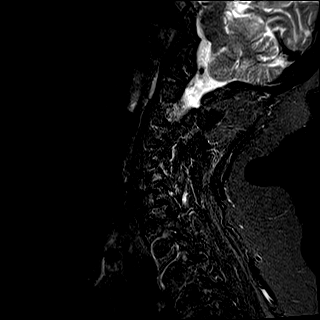
[im 8/15]
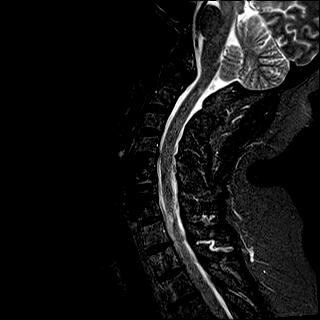
[im 10/15]
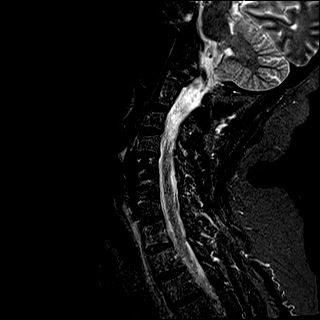
[im 12/15]
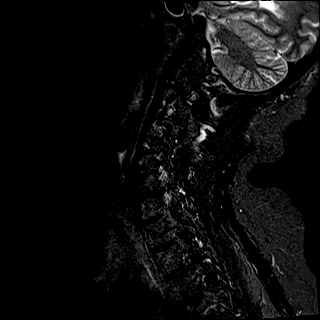
[im 15/15]
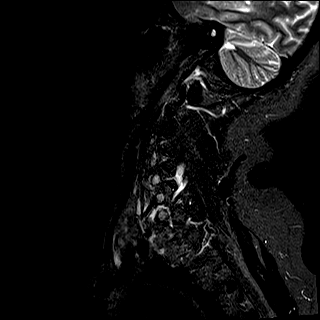

[Series 5: T2 · axial · 3.0mm · 0.35mm/px · z∈[-65,+32]mm · 8 of 32 slices shown (2 of 2)]
[im 1/32]
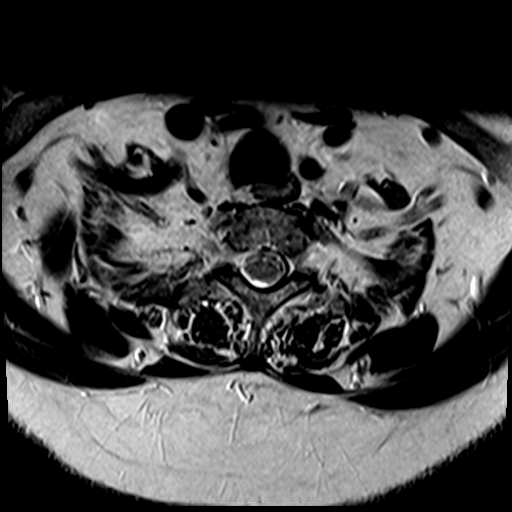
[im 5/32]
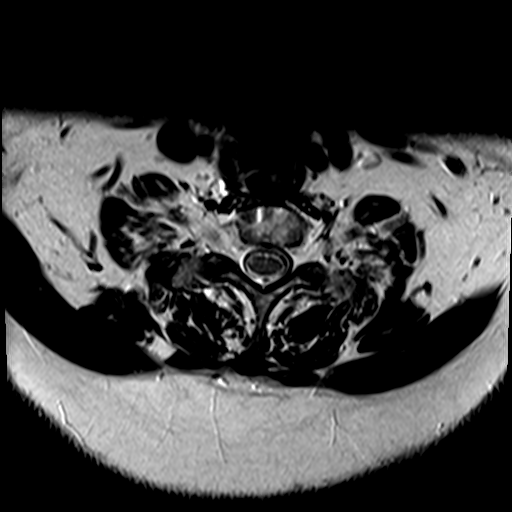
[im 10/32]
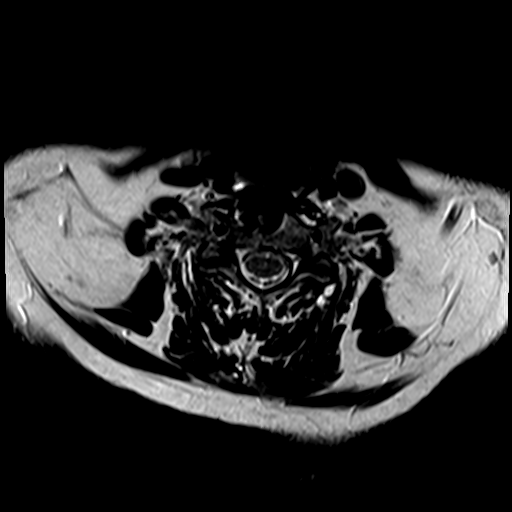
[im 15/32]
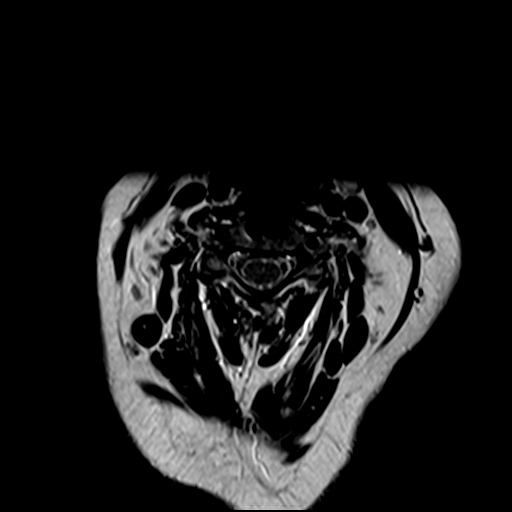
[im 17/32]
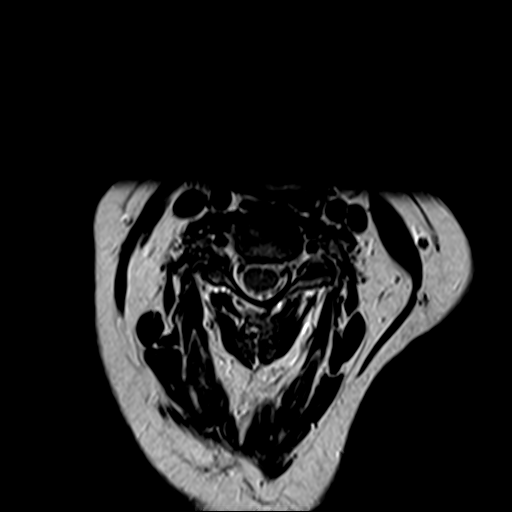
[im 22/32]
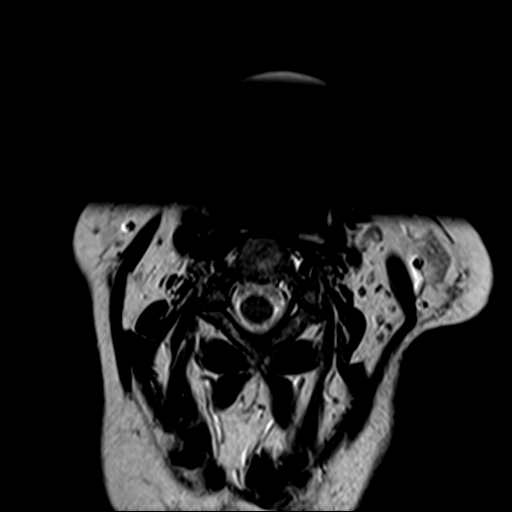
[im 27/32]
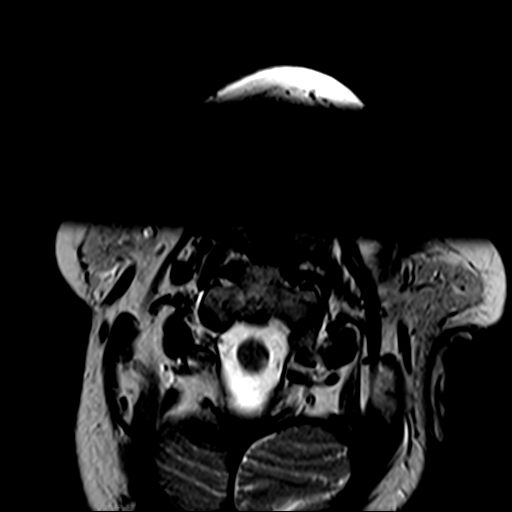
[im 32/32]
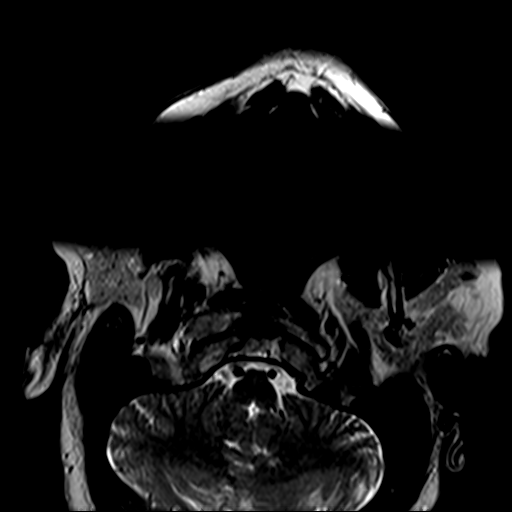

[Series 6: mpgr ax · axial · 3.0mm · 0.35mm/px · z∈[-65,-37]mm · 3 of 32 slices shown]
[im 1/32]
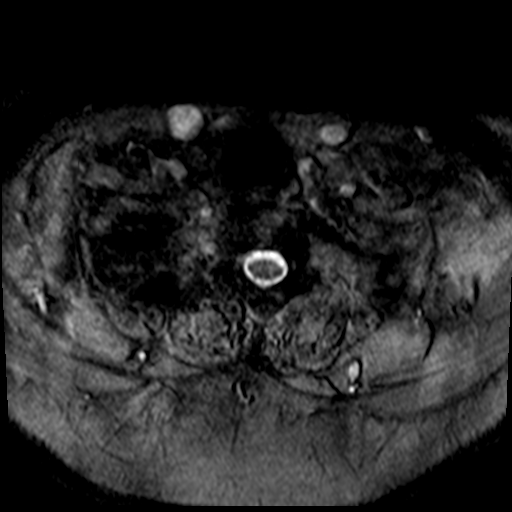
[im 5/32]
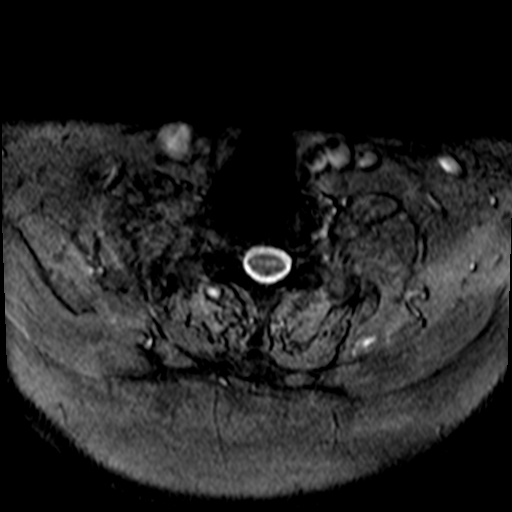
[im 10/32]
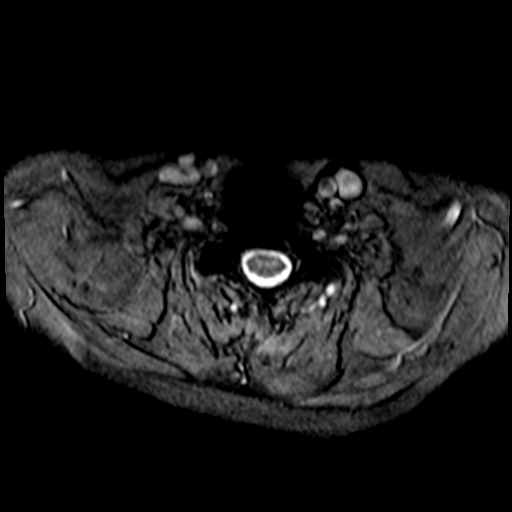

[31 of 48 positions shown; findings below may reference images not displayed]

FINDINGS: MRI HEAD FINDINGS

Brain: No acute infarct, mass effect or extra-axial collection. No
acute or chronic hemorrhage. There are multiple hyperintense
T2-weighted signal lesions within the supratentorial white matter,
including multiple pericallosal lesions. There are no infratentorial
lesions. Normal cerebral and cerebellar volume.

Vascular: Major flow voids are preserved.

Skull and upper cervical spine: Normal calvarium and skull base.
Visualized upper cervical spine and soft tissues are normal.

Sinuses/Orbits:No paranasal sinus fluid levels or advanced mucosal
thickening. No mastoid or middle ear effusion. Normal orbits.

MRI CERVICAL SPINE FINDINGS

Alignment: Normal

Vertebrae: C5-7 ACDF

Cord: Hyperintense T2-weighted signal lesion within the right
hemicord at the C7-T1 level, unchanged. No other spinal cord
lesions.

Posterior Fossa, vertebral arteries, paraspinal tissues: Negative

Disc levels:

C1-2: Unremarkable.

C2-3: Normal disc space and facet joints. There is no spinal canal
stenosis. No neural foraminal stenosis.

C3-4: Normal disc space and facet joints. There is no spinal canal
stenosis. No neural foraminal stenosis.

C4-5: Small left foraminal protrusion. There is no spinal canal
stenosis. No neural foraminal stenosis.

C5-6: ACDF. There is no spinal canal stenosis. No neural foraminal
stenosis.

C6-7: ACDF. There is no spinal canal stenosis. No neural foraminal
stenosis.

C7-T1: Normal disc space and facet joints. There is no spinal canal
stenosis. No neural foraminal stenosis.
IMPRESSION: 1. Multiple lesions in the supratentorial white matter, including
multiple pericallosal lesions. This is concerning for chronic
demyelinating disease.
2. Unchanged appearance of abnormal signal within the right hemicord
at the C7-T1 level, also concerning for chronic demyelination.
3. Unchanged small left foraminal C4-5 foraminal disc protrusion
without stenosis.
4. C5-7 ACDF without residual spinal canal or neural foraminal
stenosis.

## 2023-03-01 IMAGING — MR MR HEAD W/O CM
10 series · 48 of 48 positions shown · non-contrast
Comparison: Cervical spine MRI 12/15/2020

CLINICAL DATA: Left-sided neck pain and swelling. Other cervical
disc displacement at C5-6 level.

EXAM:
MRI HEAD WITHOUT CONTRAST
MRI CERVICAL SPINE WITHOUT CONTRAST
TECHNIQUE: Multiplanar, multiecho pulse sequences of the brain and surrounding
structures, and cervical spine, to include the craniocervical
junction and cervicothoracic junction, were obtained without
intravenous contrast.

[Series 2: DWI · axial · 3.0mm · 1.46mm/px · z∈[-58,+104]mm · 8 of 110 slices shown (1 of 4)]
[im 1/110]
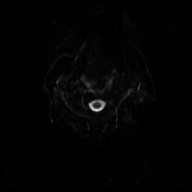
[im 16/110]
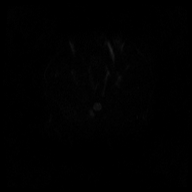
[im 32/110]
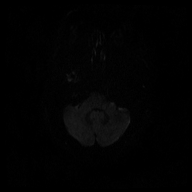
[im 47/110]
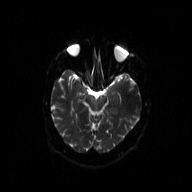
[im 63/110]
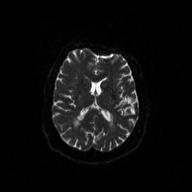
[im 78/110]
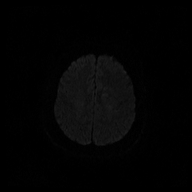
[im 94/110]
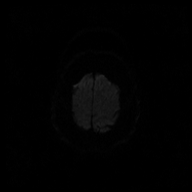
[im 110/110]
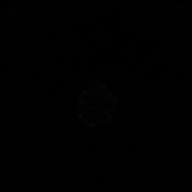

[Series 3: DWI · axial · 3.0mm · 1.46mm/px · z∈[-58,+104]mm · 5 of 55 slices shown (2 of 4)]
[im 1/55]
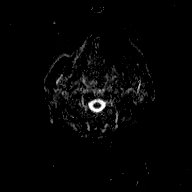
[im 14/55]
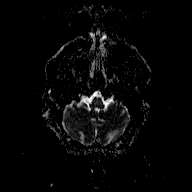
[im 28/55]
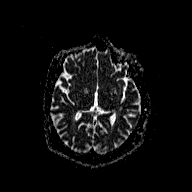
[im 41/55]
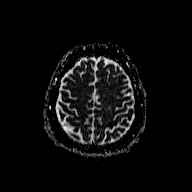
[im 55/55]
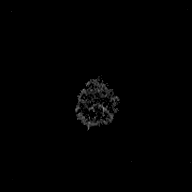

[Series 4: DWI · coronal · 5.0mm · 1.46mm/px · 5 of 60 slices shown (3 of 4)]
[im 1/60]
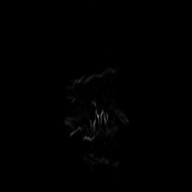
[im 15/60]
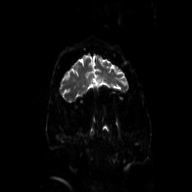
[im 30/60]
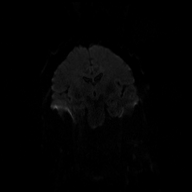
[im 45/60]
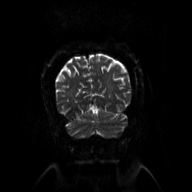
[im 60/60]
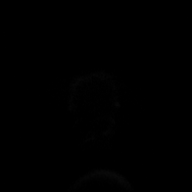

[Series 5: DWI · coronal · 5.0mm · 1.46mm/px · 3 of 30 slices shown (4 of 4)]
[im 1/30]
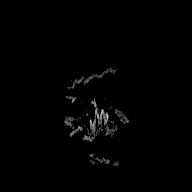
[im 15/30]
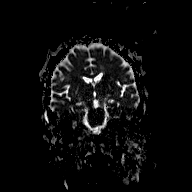
[im 30/30]
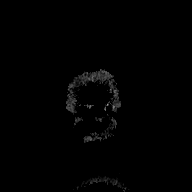

[Series 6: T1 · sagittal · 5.0mm · 0.45mm/px · 2 of 23 slices shown (1 of 2)]
[im 1/23]
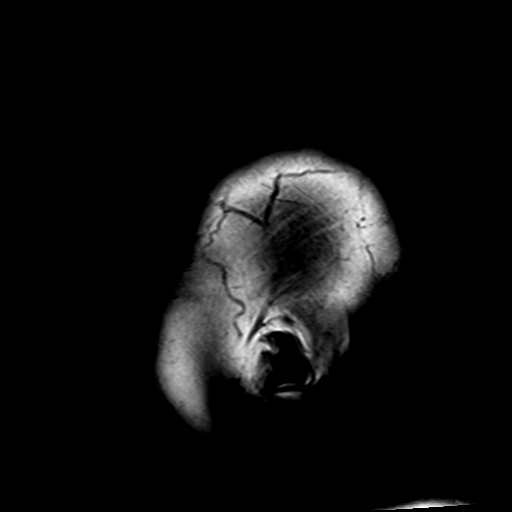
[im 23/23]
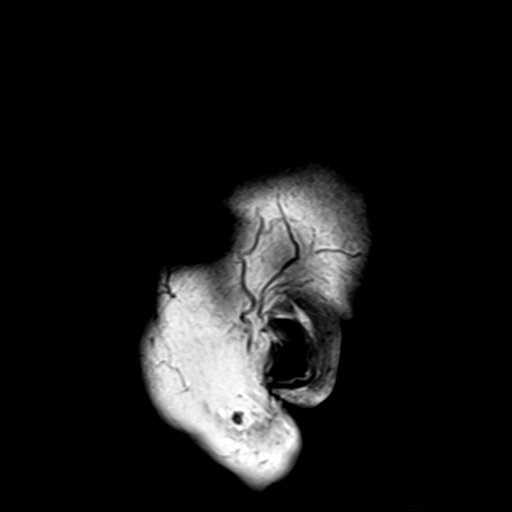

[Series 7: T2 · axial · 5.0mm · 0.72mm/px · z∈[-51,+102]mm · 2 of 23 slices shown (1 of 3)]
[im 1/23]
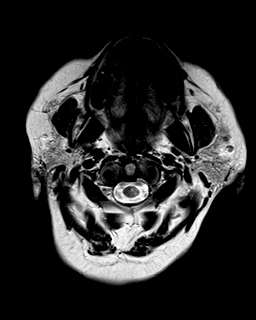
[im 23/23]
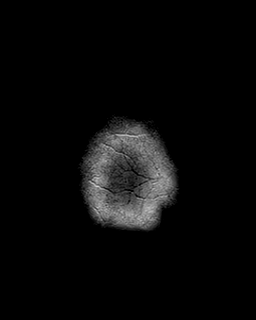

[Series 8: FLAIR · axial · 3.0mm · 0.45mm/px · z∈[-55,+106]mm · 5 of 55 slices shown]
[im 1/55]
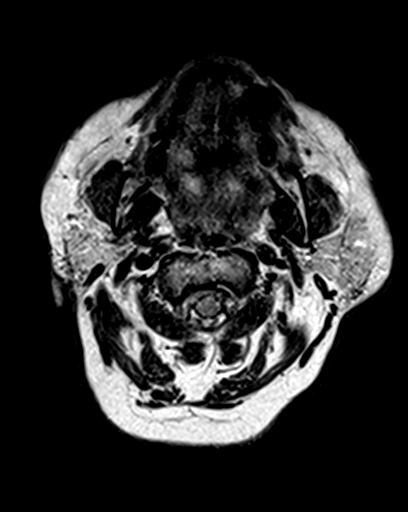
[im 14/55]
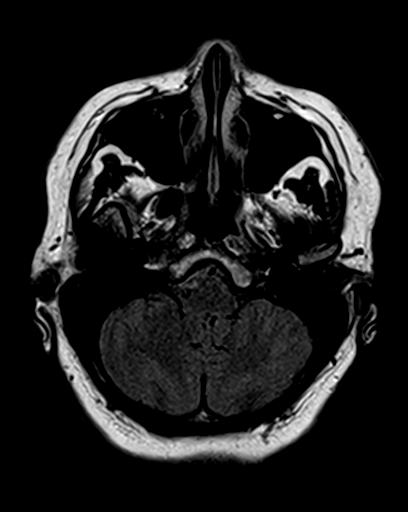
[im 28/55]
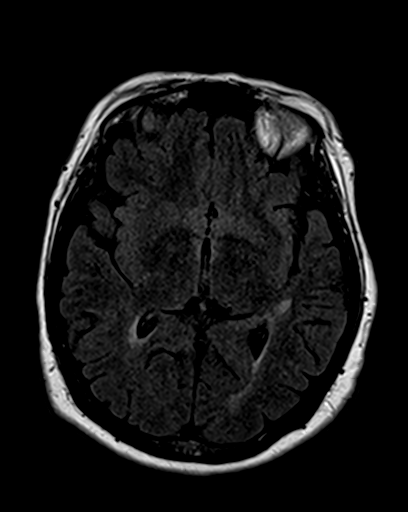
[im 41/55]
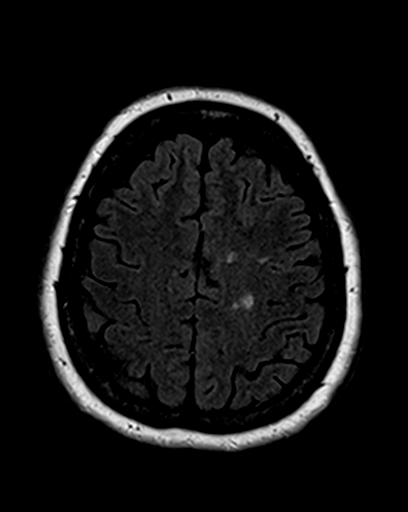
[im 55/55]
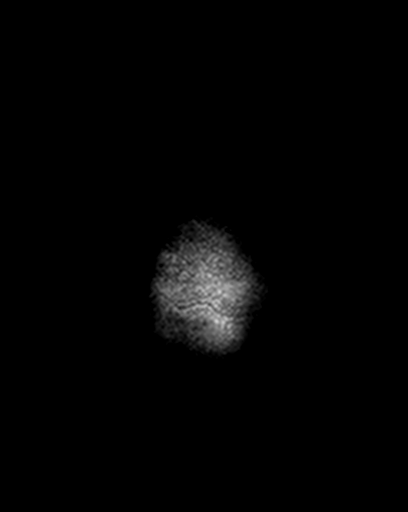

[Series 9: T2 · axial · 5.0mm · 0.72mm/px · z∈[-51,+103]mm · 2 of 23 slices shown (2 of 3)]
[im 1/23]
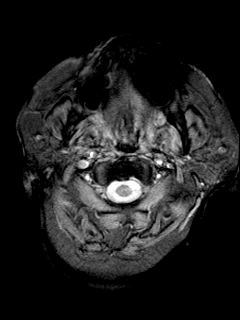
[im 23/23]
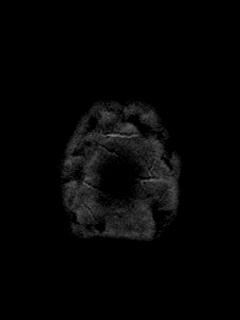

[Series 10: T1 · axial · 1.0mm · 0.75mm/px · z∈[-54,+105]mm · 14 of 160 slices shown (2 of 2)]
[im 1/160]
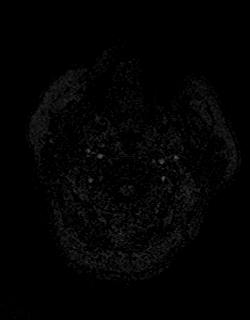
[im 13/160]
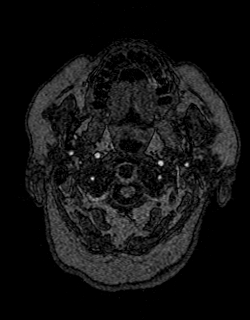
[im 25/160]
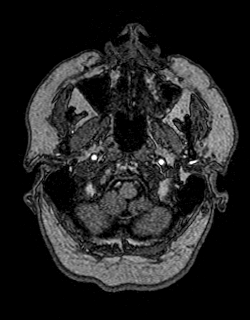
[im 37/160]
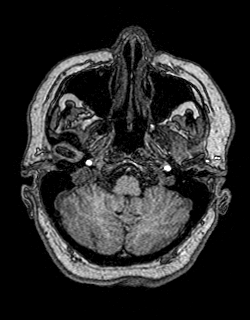
[im 49/160]
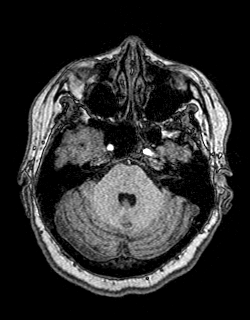
[im 62/160]
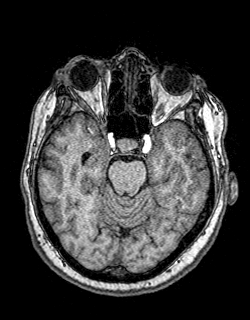
[im 74/160]
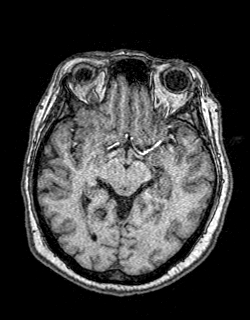
[im 86/160]
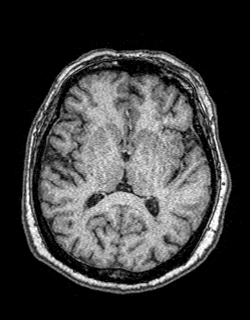
[im 98/160]
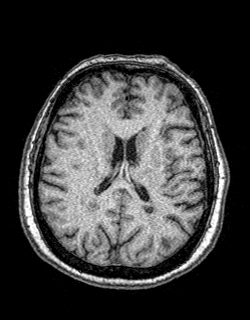
[im 111/160]
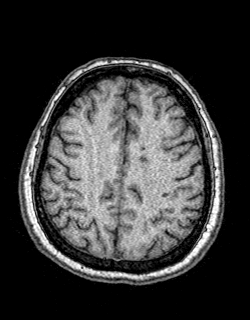
[im 123/160]
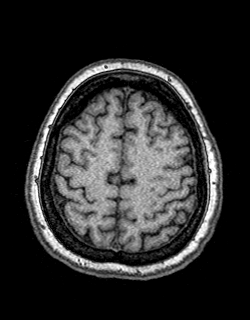
[im 135/160]
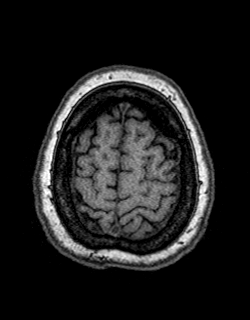
[im 147/160]
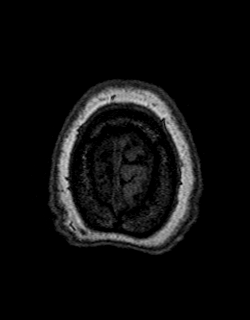
[im 160/160]
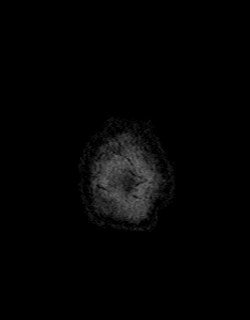

[Series 11: T2 · coronal · 5.0mm · 0.43mm/px · 2 of 28 slices shown (3 of 3)]
[im 1/28]
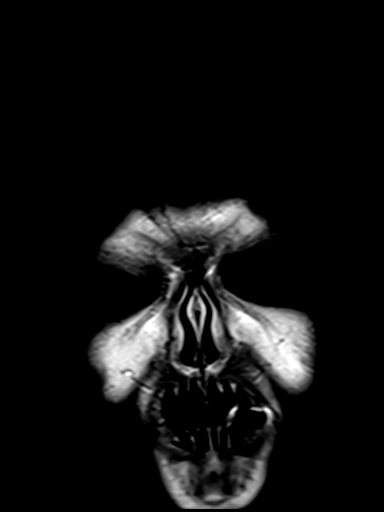
[im 28/28]
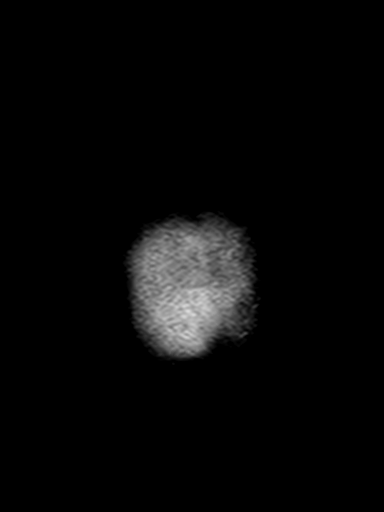

[48 of 48 positions shown; findings below may reference images not displayed]

FINDINGS: MRI HEAD FINDINGS

Brain: No acute infarct, mass effect or extra-axial collection. No
acute or chronic hemorrhage. There are multiple hyperintense
T2-weighted signal lesions within the supratentorial white matter,
including multiple pericallosal lesions. There are no infratentorial
lesions. Normal cerebral and cerebellar volume.

Vascular: Major flow voids are preserved.

Skull and upper cervical spine: Normal calvarium and skull base.
Visualized upper cervical spine and soft tissues are normal.

Sinuses/Orbits:No paranasal sinus fluid levels or advanced mucosal
thickening. No mastoid or middle ear effusion. Normal orbits.

MRI CERVICAL SPINE FINDINGS

Alignment: Normal

Vertebrae: C5-7 ACDF

Cord: Hyperintense T2-weighted signal lesion within the right
hemicord at the C7-T1 level, unchanged. No other spinal cord
lesions.

Posterior Fossa, vertebral arteries, paraspinal tissues: Negative

Disc levels:

C1-2: Unremarkable.

C2-3: Normal disc space and facet joints. There is no spinal canal
stenosis. No neural foraminal stenosis.

C3-4: Normal disc space and facet joints. There is no spinal canal
stenosis. No neural foraminal stenosis.

C4-5: Small left foraminal protrusion. There is no spinal canal
stenosis. No neural foraminal stenosis.

C5-6: ACDF. There is no spinal canal stenosis. No neural foraminal
stenosis.

C6-7: ACDF. There is no spinal canal stenosis. No neural foraminal
stenosis.

C7-T1: Normal disc space and facet joints. There is no spinal canal
stenosis. No neural foraminal stenosis.
IMPRESSION: 1. Multiple lesions in the supratentorial white matter, including
multiple pericallosal lesions. This is concerning for chronic
demyelinating disease.
2. Unchanged appearance of abnormal signal within the right hemicord
at the C7-T1 level, also concerning for chronic demyelination.
3. Unchanged small left foraminal C4-5 foraminal disc protrusion
without stenosis.
4. C5-7 ACDF without residual spinal canal or neural foraminal
stenosis.

## 2023-03-11 ENCOUNTER — Encounter: Payer: Self-pay | Admitting: Neurology

## 2023-03-11 ENCOUNTER — Ambulatory Visit: Payer: Medicare Other | Admitting: Neurology

## 2023-03-11 VITALS — BP 145/75 | HR 54 | Ht 62.0 in | Wt 217.5 lb

## 2023-03-11 DIAGNOSIS — Z79899 Other long term (current) drug therapy: Secondary | ICD-10-CM

## 2023-03-11 DIAGNOSIS — G35 Multiple sclerosis: Secondary | ICD-10-CM | POA: Diagnosis not present

## 2023-03-11 DIAGNOSIS — E559 Vitamin D deficiency, unspecified: Secondary | ICD-10-CM

## 2023-03-11 DIAGNOSIS — R200 Anesthesia of skin: Secondary | ICD-10-CM | POA: Diagnosis not present

## 2023-03-11 DIAGNOSIS — R269 Unspecified abnormalities of gait and mobility: Secondary | ICD-10-CM | POA: Diagnosis not present

## 2023-03-11 DIAGNOSIS — G35D Multiple sclerosis, unspecified: Secondary | ICD-10-CM

## 2023-03-11 NOTE — Progress Notes (Signed)
[facility]  PATIENT: Savannah Morrow DOB: Jul 30, 1956  REFERRING DOCTOR OR PCP: Coletta Memos MD (neurosurgery); Tana Conch, MD (PCP) SOURCE: Patient, notes from Dr. Franky Macho, imaging and lab reports, MRI images personally reviewed.  _________________________________   HISTORICAL  CHIEF COMPLAINT:  Chief Complaint  Patient presents with   Follow-up    RM 11, alone. Last seen 12/03/22. MS DMT: Mavenclad.  Got blisters in nostrils from [facility]. Had to go on Keflex to clear it up. Restarted Mavenclad after and has blisters again. Otherwise, tolerated Mavenclad well. Ambulates with cane.     HISTORY OF PRESENT ILLNESS:  Savannah Morrow is a 67 y.o. woman with multiple sclerosis.  Update 03/11/2023 She did her 1st year of Mavenclad late March and late April 2023 and second year March and April 2024 (last pill 02/07/2023). She had stomach pain and nasal blisters with the second year.   THis took bout 2 weeks to completely  recover -- now completely at baseline and fatigue is much better.   She was able to do gardening with less tiredness.   She has done well since.   No infections or other issues.   CBC/D showed a lymphocyte count of 0.5 at 2 months ad 0.7 at 6 month  after the first year.      Balance and gait are doing a little better.    She uses a cane for safety but walks some without it.  She uses a bannister on stairs.   She tries to walk a mile a day as exercise and does > 5000 steps a day (already has >5000 today).    She notes left facial and bilateral leg pins/needles senation but less than last year.    Vision is stable.   No color desaturation.      She has has mild stress incontinence (cough/laugh) for many years.  Some nocturia, imipramine not well tolerated   She wears a left hearing aid     She has fatigue the past few months.     She notes symptoms are worse with heat and she likes gardening.      She is sleeping better.    She denies any cognitive issues.    She notes no depression or anxiety.      She takes Vit D supplement  MS History She had lower cervical spina and right muscle spasms and pain and numbness in the right arm in C6 dermatome.   She also noted right  hip pain and sometimes the leg would almost give out.   She had surgery May 2022.    She developed Covid-19 during 08/2021.  She felt weaker afterwards and had left neck pain.   She noted an electric shock sensation going down the left neck.     She felt better using an ice pack.     In January she began to have reduced balance and she had pain in the left jaw and left facial numbness.   She improved some but still had pain and had a follow-up MRI 12/09/2021.    It showed the ACDF and a focus c/w MS.    MRI of the brain also showed foci c/w MS.     She is referred for further evaluation and treatment  Several of her mother's cousins have MS.  Her sister had an AVM causing a cerebral hemorrhage.      She is fairly healthy with HTN and borderline DM.   She is trying  to lose weight.     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  She had multiple CXR afterwards - all fine.   Last one 2010  Imaging review  MRI of the cervical spine 12/09/2021 shows a T2 hyperintense focus laterally to the right and the spinal cord adjacent to C7 and T1.   Additionally, there is a left posterolateral focus adjacent to C1-C2 that also appeared to be present on the 2022 MRI.   On the 2023 MRI, she is status post C5-C7 ACDF.  This study was compared to the MRI from 12/15/2020.  At that time, she had significant degenerative changes at C5-C6 and C6-C7 and also had spinal stenosis and foraminal narrowing at those levels.  The 2 probable demyelinating foci are present on this MRI.  MRI of the brain 12/09/2021 showed scattered T2/FLAIR hyperintense foci in the periventricular, juxtacortical and deep white matter of the hemispheres in a pattern compatible with multiple sclerosis.  There may be 1 small  hyperintensity in the left basis pontis.  This study was done without contrast.    REVIEW OF SYSTEMS: Constitutional: No fevers, chills, sweats, or change in appetite Eyes: No visual changes, double vision, eye pain Ear, nose and throat: No hearing loss, ear pain, nasal congestion, sore throat Cardiovascular: No chest pain, palpitations Respiratory:  No shortness of breath at rest or with exertion.   No wheezes GastrointestinaI: No nausea, vomiting, diarrhea, abdominal pain, fecal incontinence Genitourinary:  No dysuria, urinary retention or frequency.  No nocturia. Musculoskeletal:  No neck pain, back pain Integumentary: No rash, pruritus, skin lesions Neurological: as above Psychiatric: No depression at this time.  No anxiety Endocrine: No palpitations, diaphoresis, change in appetite, change in weigh or increased thirst Hematologic/Lymphatic:  No anemia, purpura, petechiae. Allergic/Immunologic: No itchy/runny eyes, nasal congestion, recent allergic reactions, rashes  ALLERGIES: Allergies  Allergen Reactions   Meperidine Hcl Anaphylaxis   Morphine    Percocet [Oxycodone-Acetaminophen]     rash   Sulfamethoxazole    Trimethoprim    Mupirocin Rash   Sulfamethoxazole-Trimethoprim Rash    HOME MEDICATIONS:  Current Outpatient Medications:    atenolol-chlorthalidone (TENORETIC) 50-25 MG tablet, Take 1 tablet by mouth daily., Disp: 90 tablet, Rfl: 3   atorvastatin (LIPITOR) 20 MG tablet, Take 1 tablet (20 mg total) by mouth daily., Disp: 90 tablet, Rfl: 3   Cholecalciferol (VITAMIN D3) 50 MCG (2000 UT) capsule, Take by mouth daily., Disp: , Rfl:    Cladribine, 9 Tabs, (MAVENCLAD, 9 TABS,) 10 MG TBPK, Take 10 mg by mouth as directed. Month 1= 2 tablets PO on day 1-4. 1 tablet on day 5. Month 2= 2 tablets PO on day 1-3. 1 tablet on day 4-5. First dose of first cycle on 01/13/22., Disp: , Rfl:    fluticasone (FLONASE) 50 MCG/ACT nasal spray, Place 1 spray into both nostrils daily.,  Disp: 48 g, Rfl: 3   loratadine (CLARITIN) 10 MG tablet, Take 10 mg by mouth daily as needed., Disp: , Rfl:    Multiple Vitamins-Minerals (MULTIVITAMIN ADULTS 50+ PO), Take 1 tablet by mouth daily., Disp: , Rfl:    potassium chloride SA (KLOR-CON M) 20 MEQ tablet, Take 1 tablet (20 mEq total) by mouth daily., Disp: 90 tablet, Rfl: 3  PAST MEDICAL HISTORY: Past Medical History:  Diagnosis Date   Allergy    Arthritis    knees   Crohn's disease (HCC)    colon resection in 20. Dr. Leone Payor unsure of  diagnosis- no evidence on colonoscopy   Heart murmur    Hx of adenomatous colonic polyps 03/17/2018   Hyperlipidemia    Hypertension    Multiple sclerosis (HCC)    Uterine cancer (HCC) 1989   s/p hysterectomy    PAST SURGICAL HISTORY: Past Surgical History:  Procedure Laterality Date   APPENDECTOMY     BREAST BIOPSY Left 03/2019   fatty tissue   COLON SURGERY  1978-79   COLONOSCOPY     OOPHORECTOMY     x1   total hysterectomy     WISDOM TOOTH EXTRACTION      FAMILY HISTORY: Family History  Problem Relation Age of Onset   Uterine cancer Mother    Alzheimer's disease Mother        died 2019-12-02  Diabetes Mother    Heart disease Father    Hypertension Father    Cancer Father        b cell lymphoma   CVA Father        passed from stroke 81   AVM Sister        8 years younger. brain. significant in hospital 6 weeks. Ltach/trach feeding tube   Breast cancer Maternal Grandmother    Diabetes Brother    Colon cancer Neg Hx    Esophageal cancer Neg Hx    Stomach cancer Neg Hx     SOCIAL HISTORY:  Social History   Socioeconomic History   Marital status: Married    Spouse name: [name]   Number of children: 3   Years of education: Not on file   Highest education level: Bachelor's degree (e.g., BA, AB, BS)  Occupational History   Occupation: retired  Tobacco Use   Smoking status: Never   Smokeless tobacco: Never  Vaping Use   Vaping Use: Never used  Substance  and Sexual Activity   Alcohol use: No   Drug use: No   Sexual activity: Yes  Other Topics Concern   Not on file  Social History Narrative   Married 47 years in 2020. 3 children. 7 grandchildren. 4 greatgrandchildren.    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, multimedia work 2010, last worked with [employer]   Hobbies: working in garden, camping in RV   Right handed   Caffeine: zero   Social Determinants of Health   Financial Resource Strain: Low Risk  (06/02/2022)   Overall Financial Resource Strain (CARDIA)    Difficulty of Paying Living Expenses: Not hard at all  Food Insecurity: No Food Insecurity (06/02/2022)   Hunger Vital Sign    Worried About Running Out of Food in the Last Year: Never true    Ran Out of Food in the Last Year: Never true  Transportation Needs: No Transportation Needs (06/02/2022)   PRAPARE - Administrator, Civil Service (Medical): No    Lack of Transportation (Non-Medical): No  Physical Activity: Sufficiently Active (06/02/2022)   Exercise Vital Sign    Days of Exercise per Week: 7 days    Minutes of Exercise per Session: 30 min  Stress: No Stress Concern Present (06/02/2022)   [employer] of Occupational Health - Occupational Stress Questionnaire    Feeling of Stress : Not at all  Social Connections: Socially Integrated (06/02/2022)   Social Connection and Isolation Panel [NHANES]    Frequency of Communication with Friends and Family: More than three times a week    Frequency of Social Gatherings with Friends and Family: Three times a week  Attends Religious Services: More than 4 times per year    Active Member of Clubs or Organizations: Yes    Attends Banker Meetings: 1 to 4 times per year    Marital Status: Married  Catering manager Violence: Not At Risk (06/02/2022)   Humiliation, Afraid, Rape, and Kick questionnaire    Fear of Current or Ex-Partner: No    Emotionally Abused: No    Physically Abused: No    Sexually Abused: No      PHYSICAL EXAM  Vitals:   03/11/23 1336  BP: (!) 145/75  Pulse: (!) 54  Weight: 217 lb 8 oz (98.7 kg)  Height: 5\' 2"  (1.575 m)    Body mass index is 39.78 kg/m.   General: The patient is well-developed and well-nourished and in no acute distress  HEENT:  Head is [place]/AT.  Sclera are anicteric.   Skin: Extremities are without rash or  edema.  Musculoskeletal:  Back and neck are nontender  Neurologic Exam  Mental status: The patient is alert and oriented x 3 at the time of the examination. The patient has apparent normal recent and remote memory, with an apparently normal attention span and concentration ability.   Speech is normal.  Cranial nerves: Extraocular movements are full. Pupils are equal, round, and reactive to light and accomodation.  Facial strength and sensation are fine . No obvious hearing deficits are noted.  Motor:  Muscle bulk is normal.   Tone is normal. Strength is  5 / 5 in all 4 extremities except 4+/5 riht iliopsoas and toes.   Sensory: Sensory testing is intact to pinprick, soft touch and vibration sensation in all 4 extremities.  Coordination: Cerebellar testing reveals good finger-nose-finger and reduced right heel-to-shin bilaterally.  Gait and station: Station is normal.   Her Gait is mildly wide .  Turn in 3 steps. . Tandem gait is  mildly wide ut better than last visit.  Romberg is negative.   Reflexes: Deep tendon reflexes are symmetric and normal bilaterally in arms.       DIAGNOSTIC DATA (LABS, IMAGING, TESTING) - I reviewed patient records, labs, notes, testing and imaging myself where available.  Lab Results  Component Value Date   WBC 6.2 12/03/2022   HGB 13.9 12/03/2022   HCT 42.2 12/03/2022   MCV 86 12/03/2022   PLT 220 12/03/2022      Component Value Date/Time   NA 144 12/03/2022 1508   K 4.5 12/03/2022 1508   CL 103 12/03/2022 1508   CO2 27 12/03/2022 1508   GLUCOSE 101 (H) 12/03/2022 1508   GLUCOSE 95 06/03/2022  1103   GLUCOSE 101 (H) 10/22/2006 1217   BUN 11 12/03/2022 1508   CREATININE 0.70 12/03/2022 1508   CREATININE 0.79 07/31/2020 1031   CALCIUM 9.9 12/03/2022 1508   PROT 7.0 12/03/2022 1508   ALBUMIN 4.4 12/03/2022 1508   AST 19 12/03/2022 1508   ALT 16 12/03/2022 1508   ALKPHOS 95 12/03/2022 1508   BILITOT 0.6 12/03/2022 1508   GFRNONAA 78.90 01/04/2010 0813   GFRAA 84 10/26/2008 0949   Lab Results  Component Value Date   CHOL 132 06/03/2022   HDL 47.20 06/03/2022   LDLCALC 69 06/03/2022   LDLDIRECT 63.0 11/09/2018   TRIG 83.0 06/03/2022   CHOLHDL 3 06/03/2022   Lab Results  Component Value Date   HGBA1C 5.4 12/09/2022   No results found for: "VITAMINB12" Lab Results  Component Value Date   TSH 2.26 04/07/2016  ASSESSMENT AND PLAN  Multiple sclerosis (HCC) - Plan: CBC with Differential/Platelet, Comprehensive metabolic panel  High risk medication use - Plan: CBC with Differential/Platelet, Comprehensive metabolic panel  Numbness  Gait disturbance  Vitamin D deficiency  Has completed Mavenclad year 2.  Check labs today Stay active and exercise.  She eats well.     Continue Vit D suppl.   Rtc 4 months, will recheck labs then     Zadok Holaway A. Epimenio Foot, MD, [facility] 03/11/2023, 2:15 PM Certified in Neurology, Clinical Neurophysiology, Sleep Medicine and Neuroimaging  [facility] [address] [place], [place] [address] [phone]

## 2023-03-12 LAB — CBC WITH DIFFERENTIAL/PLATELET
Basophils Absolute: 0 10*3/uL (ref 0.0–0.2)
Basos: 0 %
EOS (ABSOLUTE): 0.2 10*3/uL (ref 0.0–0.4)
Eos: 3 %
Hematocrit: 39.9 % (ref 34.0–46.6)
Hemoglobin: 13.1 g/dL (ref 11.1–15.9)
Immature Grans (Abs): 0 10*3/uL (ref 0.0–0.1)
Immature Granulocytes: 0 %
Lymphocytes Absolute: 0.3 10*3/uL — ABNORMAL LOW (ref 0.7–3.1)
Lymphs: 6 %
MCH: 30.1 pg (ref 26.6–33.0)
MCHC: 32.8 g/dL (ref 31.5–35.7)
MCV: 92 fL (ref 79–97)
Monocytes Absolute: 0.6 10*3/uL (ref 0.1–0.9)
Monocytes: 12 %
Neutrophils Absolute: 3.9 10*3/uL (ref 1.4–7.0)
Neutrophils: 79 %
Platelets: 201 10*3/uL (ref 150–450)
RBC: 4.35 x10E6/uL (ref 3.77–5.28)
RDW: 13.9 % (ref 11.7–15.4)
WBC: 4.9 10*3/uL (ref 3.4–10.8)

## 2023-03-12 LAB — COMPREHENSIVE METABOLIC PANEL
ALT: 19 IU/L (ref 0–32)
AST: 17 IU/L (ref 0–40)
Albumin/Globulin Ratio: 1.8 (ref 1.2–2.2)
Albumin: 4.2 g/dL (ref 3.9–4.9)
Alkaline Phosphatase: 95 IU/L (ref 44–121)
BUN/Creatinine Ratio: 21 (ref 12–28)
BUN: 14 mg/dL (ref 8–27)
Bilirubin Total: 0.7 mg/dL (ref 0.0–1.2)
CO2: 26 mmol/L (ref 20–29)
Calcium: 9.3 mg/dL (ref 8.7–10.3)
Chloride: 100 mmol/L (ref 96–106)
Creatinine, Ser: 0.66 mg/dL (ref 0.57–1.00)
Globulin, Total: 2.3 g/dL (ref 1.5–4.5)
Glucose: 86 mg/dL (ref 70–99)
Potassium: 4.1 mmol/L (ref 3.5–5.2)
Sodium: 140 mmol/L (ref 134–144)
Total Protein: 6.5 g/dL (ref 6.0–8.5)
eGFR: 95 mL/min/{1.73_m2} (ref >59)

## 2023-03-17 ENCOUNTER — Encounter: Payer: Self-pay | Admitting: Neurology

## 2023-04-09 ENCOUNTER — Other Ambulatory Visit (HOSPITAL_COMMUNITY): Payer: Self-pay

## 2023-04-22 DIAGNOSIS — G35 Multiple sclerosis: Secondary | ICD-10-CM | POA: Diagnosis not present

## 2023-04-22 DIAGNOSIS — T1512XA Foreign body in conjunctival sac, left eye, initial encounter: Secondary | ICD-10-CM | POA: Diagnosis not present

## 2023-04-22 DIAGNOSIS — D492 Neoplasm of unspecified behavior of bone, soft tissue, and skin: Secondary | ICD-10-CM | POA: Diagnosis not present

## 2023-04-22 DIAGNOSIS — Z961 Presence of intraocular lens: Secondary | ICD-10-CM | POA: Diagnosis not present

## 2023-05-03 ENCOUNTER — Other Ambulatory Visit: Payer: Self-pay | Admitting: Family Medicine

## 2023-05-03 ENCOUNTER — Encounter: Payer: Self-pay | Admitting: Family Medicine

## 2023-05-04 ENCOUNTER — Other Ambulatory Visit (HOSPITAL_COMMUNITY): Payer: Self-pay

## 2023-05-04 ENCOUNTER — Other Ambulatory Visit: Payer: Self-pay

## 2023-05-04 MED ORDER — ATENOLOL-CHLORTHALIDONE 50-25 MG PO TABS
1.0000 | ORAL_TABLET | Freq: Every day | ORAL | 3 refills | Status: DC
  Filled 2023-05-04 (×2): qty 90, 90d supply, fill #0

## 2023-05-04 MED ORDER — ATORVASTATIN CALCIUM 20 MG PO TABS
20.0000 mg | ORAL_TABLET | Freq: Every day | ORAL | 3 refills | Status: DC
  Filled 2023-05-04: qty 90, 90d supply, fill #0
  Filled 2023-07-29: qty 90, 90d supply, fill #1
  Filled 2023-10-28: qty 90, 90d supply, fill #2
  Filled 2024-01-26: qty 90, 90d supply, fill #3

## 2023-05-04 MED ORDER — ATENOLOL-CHLORTHALIDONE 50-25 MG PO TABS
1.0000 | ORAL_TABLET | Freq: Every day | ORAL | 3 refills | Status: DC
  Filled 2023-05-04 (×2): qty 90, 90d supply, fill #0
  Filled 2023-07-29: qty 90, 90d supply, fill #1
  Filled 2023-10-28: qty 90, 90d supply, fill #2
  Filled 2024-01-26: qty 90, 90d supply, fill #3

## 2023-05-04 MED ORDER — ATORVASTATIN CALCIUM 20 MG PO TABS
20.0000 mg | ORAL_TABLET | Freq: Every day | ORAL | 3 refills | Status: DC
  Filled 2023-05-04 (×2): qty 90, 90d supply, fill #0

## 2023-05-18 ENCOUNTER — Ambulatory Visit: Payer: Medicare Other

## 2023-05-18 VITALS — Wt 217.0 lb

## 2023-05-18 DIAGNOSIS — Z0000 Encounter for general adult medical examination without abnormal findings: Secondary | ICD-10-CM

## 2023-05-18 DIAGNOSIS — Z1211 Encounter for screening for malignant neoplasm of colon: Secondary | ICD-10-CM | POA: Diagnosis not present

## 2023-05-18 DIAGNOSIS — Z86010 Personal history of colonic polyps: Secondary | ICD-10-CM

## 2023-05-18 NOTE — Progress Notes (Signed)
Subjective:   Savannah Morrow is a 67 y.o. female who presents for Medicare Annual (Subsequent) preventive examination.  Vital Signs: Unable to obtain new vitals due to this being a telehealth visit.    Visit Complete: Virtual  I connected with  [name] on 05/18/23 by a audio enabled telemedicine application and verified that I am speaking with the correct person using two identifiers.  Patient Location: Home  Provider Location: Office/Clinic  I discussed the limitations of evaluation and management by telemedicine. The patient expressed understanding and agreed to proceed.  Patient Medicare AWV questionnaire was completed by the patient on 05/14/23; I have confirmed that all information answered by patient is correct and no changes since this date.  Review of Systems     Cardiac Risk Factors include: advanced age (>80men, >18 women);dyslipidemia;hypertension;obesity (BMI >30kg/m2)     Objective:    Today's Vitals   05/18/23 0959  Weight: 217 lb (98.4 kg)   Body mass index is 39.69 kg/m.     05/18/2023   10:07 AM 06/02/2022    1:59 PM 01/03/2022    9:15 PM 01/03/2022    8:21 PM 05/23/2021    1:53 PM 01/28/2021   11:48 AM 05/17/2020    2:01 PM  Advanced Directives  Does Patient Have a Medical Advance Directive? Yes Yes No No Yes No Yes  Type of Estate agent of [place];Living will Living will;Healthcare Power of Attorney   Living will;Healthcare Power of [employer] Power of [place];Living will  Copy of Healthcare Power of Attorney in Chart? No - copy requested No - copy requested   Yes - validated most recent copy scanned in chart (See row information)  No - copy requested  Would patient like information on creating a medical advance directive?      No - Patient declined     Current Medications (verified) Outpatient Encounter Medications as of 05/18/2023  Medication Sig   atenolol-chlorthalidone (TENORETIC) 50-25 MG tablet Take 1 tablet  by mouth daily.   atorvastatin (LIPITOR) 20 MG tablet Take 1 tablet (20 mg total) by mouth daily.   Cholecalciferol (VITAMIN D3) 50 MCG (2000 UT) capsule Take by mouth daily.   fluticasone (FLONASE) 50 MCG/ACT nasal spray Place 1 spray into both nostrils daily.   loratadine (CLARITIN) 10 MG tablet Take 10 mg by mouth daily as needed.   Multiple Vitamins-Minerals (MULTIVITAMIN ADULTS 50+ PO) Take 1 tablet by mouth daily.   potassium chloride SA (KLOR-CON M) 20 MEQ tablet Take 1 tablet (20 mEq total) by mouth daily.   [DISCONTINUED] Cladribine, 9 Tabs, (MAVENCLAD, 9 TABS,) 10 MG TBPK Take 10 mg by mouth as directed. Month 1= 2 tablets PO on day 1-4. 1 tablet on day 5. Month 2= 2 tablets PO on day 1-3. 1 tablet on day 4-5. First dose of first cycle on 01/13/22.   No facility-administered encounter medications on file as of 05/18/2023.    Allergies (verified) Meperidine hcl, Morphine, Percocet [oxycodone-acetaminophen], Mupirocin, and Sulfamethoxazole-trimethoprim   History: Past Medical History:  Diagnosis Date   Allergy    Arthritis    knees   Crohn's disease (HCC)    colon resection in 20. Dr. Leone Payor unsure of diagnosis- no evidence on colonoscopy   Heart murmur    Hx of adenomatous colonic polyps 03/17/2018   Hyperlipidemia    Hypertension    Multiple sclerosis (HCC)    Uterine cancer (HCC) 1989   s/p hysterectomy   Past Surgical  History:  Procedure Laterality Date   APPENDECTOMY     BREAST BIOPSY Left 03/2019   fatty tissue   COLON SURGERY  1978-79   COLONOSCOPY     OOPHORECTOMY     x1   total hysterectomy     WISDOM TOOTH EXTRACTION     Family History  Problem Relation Age of Onset   Uterine cancer Mother    Alzheimer's disease Mother        died 11/09/2019  Diabetes Mother    Heart disease Father    Hypertension Father    Cancer Father        b cell lymphoma   CVA Father        passed from stroke 62   AVM Sister        8 years younger. brain. significant  in hospital 6 weeks. Ltach/trach feeding tube   Breast cancer Maternal Grandmother    Diabetes Brother    Colon cancer Neg Hx    Esophageal cancer Neg Hx    Stomach cancer Neg Hx    Social History   Socioeconomic History   Marital status: Married    Spouse name: [name]   Number of children: 3   Years of education: Not on file   Highest education level: Bachelor's degree (e.g., BA, AB, BS)  Occupational History   Occupation: retired  Tobacco Use   Smoking status: Never   Smokeless tobacco: Never  Vaping Use   Vaping status: Never Used  Substance and Sexual Activity   Alcohol use: No   Drug use: No   Sexual activity: Yes  Other Topics Concern   Not on file  Social History Narrative   Married 47 years in 2020. 3 children. 7 grandchildren. 4 greatgrandchildren.    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, multimedia work 2010, last worked with [employer]   Hobbies: working in garden, camping in RV   Right handed   Caffeine: zero   Social Determinants of Health   Financial Resource Strain: Low Risk  (05/14/2023)   Overall Financial Resource Strain (CARDIA)    Difficulty of Paying Living Expenses: Not hard at all  Food Insecurity: No Food Insecurity (05/14/2023)   Hunger Vital Sign    Worried About Running Out of Food in the Last Year: Never true    Ran Out of Food in the Last Year: Never true  Transportation Needs: No Transportation Needs (05/14/2023)   PRAPARE - Administrator, Civil Service (Medical): No    Lack of Transportation (Non-Medical): No  Physical Activity: Sufficiently Active (05/14/2023)   Exercise Vital Sign    Days of Exercise per Week: 6 days    Minutes of Exercise per Session: 40 min  Stress: No Stress Concern Present (05/14/2023)   [employer] of Occupational Health - Occupational Stress Questionnaire    Feeling of Stress : Not at all  Social Connections: Socially Integrated (05/14/2023)   Social Connection and Isolation Panel [NHANES]    Frequency of  Communication with Friends and Family: Three times a week    Frequency of Social Gatherings with Friends and Family: Twice a week    Attends Religious Services: More than 4 times per year    Active Member of [employer] or Organizations: Yes    Attends Engineer, structural: More than 4 times per year    Marital Status: Married    Tobacco Counseling Counseling given: Not Answered   Clinical Intake:  Pre-visit preparation completed: Yes  Pain : No/denies pain     BMI - recorded: 39.69 Nutritional Status: BMI > 30  Obese Nutritional Risks: None Diabetes: No  How often do you need to have someone help you when you read instructions, pamphlets, or other written materials from your doctor or pharmacy?: 1 - Never  Interpreter Needed?: No  Information entered by :: Lanier Ensign, LPN   Activities of Daily Living    05/14/2023    1:30 PM 06/02/2022    2:02 PM  In your present state of health, do you have any difficulty performing the following activities:  Hearing? 0 1  Comment  wera hearing aid left ear  Vision? 0 0  Difficulty concentrating or making decisions? 0 0  Walking or climbing stairs? 1 1  Comment  take time  Dressing or bathing? 0 0  Doing errands, shopping? 0 0  Preparing Food and eating ? N N  Using the Toilet? N N  In the past six months, have you accidently leaked urine? Malvin Johns  Comment  wears a pad  Do you have problems with loss of bowel control? N N  Managing your Medications? N N  Managing your Finances? N N  Housekeeping or managing your Housekeeping? N N    Patient Care Team: Shelva Majestic, MD as PCP - General (Family Medicine)  Indicate any recent Medical Services you may have received from other than [facility] providers in the past year (date may be approximate).     Assessment:   This is a routine wellness examination for Savannah Morrow.  Hearing/Vision screen Hearing Screening - Comments:: Pt wears hearing aids in left ear  Vision Screening -  Comments:: Pt follows up with Dr Massie Kluver and Dr Nelva Nay for eye exams   Dietary issues and exercise activities discussed:     Goals Addressed             This Visit's Progress    Patient Stated       Get weight down below 200 lbs        Depression Screen    05/18/2023   10:06 AM 12/09/2022    8:17 AM 06/02/2022    1:57 PM 05/23/2021    1:51 PM 11/12/2020   11:49 AM 05/21/2020    7:58 AM 05/17/2020    1:58 PM  PHQ 2/9 Scores  PHQ - 2 Score 0 0 0 0 0 0 0  PHQ- 9 Score  1    0     Fall Risk    05/14/2023    1:30 PM 12/09/2022    8:17 AM 06/02/2022    2:02 PM 05/23/2021    1:55 PM 05/21/2020    7:58 AM  Fall Risk   Falls in the past year? 0 1 1 0 0  Number falls in past yr: 0 0 1 0 0  Injury with Fall? 0 0 0 0 0  Risk for fall due to : Impaired vision;Impaired balance/gait History of fall(s) Impaired vision;Impaired balance/gait;Impaired mobility;History of fall(s) Impaired vision   Risk for fall due to: Comment   recent DX of MS    Follow up Falls prevention discussed Falls evaluation completed Falls prevention discussed Falls prevention discussed     MEDICARE RISK AT HOME:   TIMED UP AND GO:  Was the test performed?  No    Cognitive Function:        05/18/2023   10:12 AM 06/02/2022    2:06 PM 05/23/2021    1:58  PM 05/17/2020    2:10 PM  6CIT Screen  What Year? 0 points 0 points 0 points 0 points  What month? 0 points 0 points 0 points 0 points  What time? 0 points 0 points 0 points 0 points  Count back from 20 0 points 0 points 0 points 0 points  Months in reverse 0 points 0 points 0 points 0 points  Repeat phrase 0 points 0 points 0 points 2 points  Total Score 0 points 0 points 0 points 2 points    Immunizations Immunization History  Administered Date(s) Administered   Fluad Quad(high Dose 65+) 07/25/2019   Influenza Split 07/21/2011, 08/06/2012   Influenza Whole 08/24/2009   Influenza, High Dose Seasonal PF 07/24/2020, 07/25/2021, 07/24/2022    Influenza, Seasonal, Injecte, Preservative Fre 07/18/2015   Influenza,inj,Quad PF,6+ Mos 07/27/2013   Influenza-Unspecified 07/27/2014, 08/01/2016, 07/27/2017, 08/09/2018, 07/25/2019   Moderna Sars-Covid-2 Vaccination 01/10/2020, 02/07/2020, 11/27/2020   Pneumococcal Conjugate-13 05/21/2020   Pneumococcal Polysaccharide-23 05/11/2019   Td 10/20/2005   Tdap 04/07/2016   Zoster Recombinant(Shingrix) 04/27/2018, 07/28/2018   Zoster, Live 03/15/2014    TDAP status: Up to date  Flu Vaccine status: Up to date  Pneumococcal vaccine status: Up to date  Covid-19 vaccine status: Information provided on how to obtain vaccines.   Qualifies for Shingles Vaccine? Yes   Zostavax completed Yes   Shingrix Completed?: Yes  Screening Tests Health Maintenance  Topic Date Due   COVID-19 Vaccine (4 - 2023-24 season) 06/20/2022   Colonoscopy  03/10/2023   INFLUENZA VACCINE  05/21/2023   Medicare Annual Wellness (AWV)  05/17/2024   MAMMOGRAM  08/19/2024   DTaP/Tdap/Td (3 - Td or Tdap) 04/07/2026   Pneumonia Vaccine 38+ Years old  Completed   DEXA SCAN  Completed   Hepatitis C Screening  Completed   Zoster Vaccines- Shingrix  Completed   HPV VACCINES  Aged Out    Health Maintenance  Health Maintenance Due  Topic Date Due   COVID-19 Vaccine (4 - 2023-24 season) 06/20/2022   Colonoscopy  03/10/2023    Colorectal cancer screening: Referral to GI placed 05/18/23. Pt aware the office will call re: appt.  Mammogram status: Completed 08/19/22. Repeat every year  Bone Density status: Completed 06/04/22. Results reflect: Bone density results: OSTEOPENIA. Repeat every 2 years.   Additional Screening:  Hepatitis C Screening:  Completed 12/23/21  Vision Screening: Recommended annual ophthalmology exams for early detection of glaucoma and other disorders of the eye. Is the patient up to date with their annual eye exam?  Yes  Who is the provider or what is the name of the office in which the  patient attends annual eye exams? [facility] and Dr Clelia Croft and Dr Dione Booze  If pt is not established with a provider, would they like to be referred to a provider to establish care? No .   Dental Screening: Recommended annual dental exams for proper oral hygiene   Community Resource Referral / Chronic Care Management: CRR required this visit?  No   CCM required this visit?  No     Plan:     I have personally reviewed and noted the following in the patient's chart:   Medical and social history Use of alcohol, tobacco or illicit drugs  Current medications and supplements including opioid prescriptions. Patient is not currently taking opioid prescriptions. Functional ability and status Nutritional status Physical activity Advanced directives List of other physicians Hospitalizations, surgeries, and ER visits in previous 12 months Vitals Screenings to  include cognitive, depression, and falls Referrals and appointments  In addition, I have reviewed and discussed with patient certain preventive protocols, quality metrics, and best practice recommendations. A written personalized care plan for preventive services as well as general preventive health recommendations were provided to patient.     Marzella Schlein, LPN   03/26/3015   After Visit Summary: (MyChart) Due to this being a telephonic visit, the after visit summary with patients personalized plan was offered to patient via MyChart   Nurse Notes: none

## 2023-05-18 NOTE — Patient Instructions (Signed)
Savannah Morrow , Thank you for taking time to come for your Medicare Wellness Visit. I appreciate your ongoing commitment to your health goals. Please review the following plan we discussed and let me know if I can assist you in the future.   Referrals/Orders/Follow-Ups/Clinician Recommendations: get weight down below 200 lbs   This is a list of the screening recommended for you and due dates:  Health Maintenance  Topic Date Due   COVID-19 Vaccine (4 - 2023-24 season) 06/20/2022   Colon Cancer Screening  03/10/2023   Flu Shot  05/21/2023   Medicare Annual Wellness Visit  05/17/2024   Mammogram  08/19/2024   DTaP/Tdap/Td vaccine (3 - Td or Tdap) 04/07/2026   Pneumonia Vaccine  Completed   DEXA scan (bone density measurement)  Completed   Hepatitis C Screening  Completed   Zoster (Shingles) Vaccine  Completed   HPV Vaccine  Aged Out    Advanced directives: (Copy Requested) Please bring a copy of your health care power of attorney and living will to the office to be added to your chart at your convenience.  Next Medicare Annual Wellness Visit scheduled for next year: Yes  Preventive Care 9 Years and Older, Female Preventive care refers to lifestyle choices and visits with your health care provider that can promote health and wellness. What does preventive care include? A yearly physical exam. This is also called an annual well check. Dental exams once or twice a year. Routine eye exams. Ask your health care provider how often you should have your eyes checked. Personal lifestyle choices, including: Daily care of your teeth and gums. Regular physical activity. Eating a healthy diet. Avoiding tobacco and drug use. Limiting alcohol use. Practicing safe sex. Taking low-dose aspirin every day. Taking vitamin and mineral supplements as recommended by your health care provider. What happens during an annual well check? The services and screenings done by your health care provider during  your annual well check will depend on your age, overall health, lifestyle risk factors, and family history of disease. Counseling  Your health care provider may ask you questions about your: Alcohol use. Tobacco use. Drug use. Emotional well-being. Home and relationship well-being. Sexual activity. Eating habits. History of falls. Memory and ability to understand (cognition). Work and work Astronomer. Reproductive health. Screening  You may have the following tests or measurements: Height, weight, and BMI. Blood pressure. Lipid and cholesterol levels. These may be checked every 5 years, or more frequently if you are over 24 years old. Skin check. Lung cancer screening. You may have this screening every year starting at age 71 if you have a 30-pack-year history of smoking and currently smoke or have quit within the past 15 years. Fecal occult blood test (FOBT) of the stool. You may have this test every year starting at age 71. Flexible sigmoidoscopy or colonoscopy. You may have a sigmoidoscopy every 5 years or a colonoscopy every 10 years starting at age 72. Hepatitis C blood test. Hepatitis B blood test. Sexually transmitted disease (STD) testing. Diabetes screening. This is done by checking your blood sugar (glucose) after you have not eaten for a while (fasting). You may have this done every 1-3 years. Bone density scan. This is done to screen for osteoporosis. You may have this done starting at age 70. Mammogram. This may be done every 1-2 years. Talk to your health care provider about how often you should have regular mammograms. Talk with your health care provider about your test results, treatment  options, and if necessary, the need for more tests. Vaccines  Your health care provider may recommend certain vaccines, such as: Influenza vaccine. This is recommended every year. Tetanus, diphtheria, and acellular pertussis (Tdap, Td) vaccine. You may need a Td booster every 10  years. Zoster vaccine. You may need this after age 3. Pneumococcal 13-valent conjugate (PCV13) vaccine. One dose is recommended after age 52. Pneumococcal polysaccharide (PPSV23) vaccine. One dose is recommended after age 26. Talk to your health care provider about which screenings and vaccines you need and how often you need them. This information is not intended to replace advice given to you by your health care provider. Make sure you discuss any questions you have with your health care provider. Document Released: 11/02/2015 Document Revised: 06/25/2016 Document Reviewed: 08/07/2015 Elsevier Interactive Patient Education  2017 [employer].  Fall Prevention in the Home Falls can cause injuries. They can happen to people of all ages. There are many things you can do to make your home safe and to help prevent falls. What can I do on the outside of my home? Regularly fix the edges of walkways and driveways and fix any cracks. Remove anything that might make you trip as you walk through a door, such as a raised step or threshold. Trim any bushes or trees on the path to your home. Use bright outdoor lighting. Clear any walking paths of anything that might make someone trip, such as rocks or tools. Regularly check to see if handrails are loose or broken. Make sure that both sides of any steps have handrails. Any raised decks and porches should have guardrails on the edges. Have any leaves, snow, or ice cleared regularly. Use sand or salt on walking paths during winter. Clean up any spills in your garage right away. This includes oil or grease spills. What can I do in the bathroom? Use night lights. Install grab bars by the toilet and in the tub and shower. Do not use towel bars as grab bars. Use non-skid mats or decals in the tub or shower. If you need to sit down in the shower, use a plastic, non-slip stool. Keep the floor dry. Clean up any water that spills on the floor as soon as it  happens. Remove soap buildup in the tub or shower regularly. Attach bath mats securely with double-sided non-slip rug tape. Do not have throw rugs and other things on the floor that can make you trip. What can I do in the bedroom? Use night lights. Make sure that you have a light by your bed that is easy to reach. Do not use any sheets or blankets that are too big for your bed. They should not hang down onto the floor. Have a firm chair that has side arms. You can use this for support while you get dressed. Do not have throw rugs and other things on the floor that can make you trip. What can I do in the kitchen? Clean up any spills right away. Avoid walking on wet floors. Keep items that you use a lot in easy-to-reach places. If you need to reach something above you, use a strong step stool that has a grab bar. Keep electrical cords out of the way. Do not use floor polish or wax that makes floors slippery. If you must use wax, use non-skid floor wax. Do not have throw rugs and other things on the floor that can make you trip. What can I do with my stairs? Do not  leave any items on the stairs. Make sure that there are handrails on both sides of the stairs and use them. Fix handrails that are broken or loose. Make sure that handrails are as long as the stairways. Check any carpeting to make sure that it is firmly attached to the stairs. Fix any carpet that is loose or worn. Avoid having throw rugs at the top or bottom of the stairs. If you do have throw rugs, attach them to the floor with carpet tape. Make sure that you have a light switch at the top of the stairs and the bottom of the stairs. If you do not have them, ask someone to add them for you. What else can I do to help prevent falls? Wear shoes that: Do not have high heels. Have rubber bottoms. Are comfortable and fit you well. Are closed at the toe. Do not wear sandals. If you use a stepladder: Make sure that it is fully opened.  Do not climb a closed stepladder. Make sure that both sides of the stepladder are locked into place. Ask someone to hold it for you, if possible. Clearly mark and make sure that you can see: Any grab bars or handrails. First and last steps. Where the edge of each step is. Use tools that help you move around (mobility aids) if they are needed. These include: Canes. Walkers. Scooters. Crutches. Turn on the lights when you go into a dark area. Replace any light bulbs as soon as they burn out. Set up your furniture so you have a clear path. Avoid moving your furniture around. If any of your floors are uneven, fix them. If there are any pets around you, be aware of where they are. Review your medicines with your doctor. Some medicines can make you feel dizzy. This can increase your chance of falling. Ask your doctor what other things that you can do to help prevent falls. This information is not intended to replace advice given to you by your health care provider. Make sure you discuss any questions you have with your health care provider. Document Released: 08/02/2009 Document Revised: 03/13/2016 Document Reviewed: 11/10/2014 Elsevier Interactive Patient Education  2017 [employer].

## 2023-05-20 ENCOUNTER — Encounter (INDEPENDENT_AMBULATORY_CARE_PROVIDER_SITE_OTHER): Payer: Self-pay

## 2023-05-25 ENCOUNTER — Encounter: Payer: Self-pay | Admitting: Internal Medicine

## 2023-06-09 ENCOUNTER — Other Ambulatory Visit (HOSPITAL_COMMUNITY): Payer: Self-pay

## 2023-06-09 ENCOUNTER — Ambulatory Visit (INDEPENDENT_AMBULATORY_CARE_PROVIDER_SITE_OTHER): Payer: Medicare Other | Admitting: Family Medicine

## 2023-06-09 ENCOUNTER — Encounter: Payer: Self-pay | Admitting: Family Medicine

## 2023-06-09 VITALS — BP 120/72 | HR 61 | Temp 97.2°F | Ht 62.0 in | Wt 210.6 lb

## 2023-06-09 DIAGNOSIS — G35 Multiple sclerosis: Secondary | ICD-10-CM

## 2023-06-09 DIAGNOSIS — R739 Hyperglycemia, unspecified: Secondary | ICD-10-CM

## 2023-06-09 DIAGNOSIS — E785 Hyperlipidemia, unspecified: Secondary | ICD-10-CM | POA: Diagnosis not present

## 2023-06-09 DIAGNOSIS — K50919 Crohn's disease, unspecified, with unspecified complications: Secondary | ICD-10-CM

## 2023-06-09 DIAGNOSIS — Z131 Encounter for screening for diabetes mellitus: Secondary | ICD-10-CM

## 2023-06-09 DIAGNOSIS — E559 Vitamin D deficiency, unspecified: Secondary | ICD-10-CM | POA: Diagnosis not present

## 2023-06-09 DIAGNOSIS — Z0000 Encounter for general adult medical examination without abnormal findings: Secondary | ICD-10-CM | POA: Diagnosis not present

## 2023-06-09 DIAGNOSIS — I10 Essential (primary) hypertension: Secondary | ICD-10-CM | POA: Diagnosis not present

## 2023-06-09 LAB — CBC WITH DIFFERENTIAL/PLATELET
Basophils Absolute: 0 10*3/uL (ref 0.0–0.1)
Basophils Relative: 0.5 % (ref 0.0–3.0)
Eosinophils Absolute: 0.1 10*3/uL (ref 0.0–0.7)
Eosinophils Relative: 2.8 % (ref 0.0–5.0)
HCT: 40.4 % (ref 36.0–46.0)
Hemoglobin: 13.1 g/dL (ref 12.0–15.0)
Lymphocytes Relative: 8.1 % — ABNORMAL LOW (ref 12.0–46.0)
Lymphs Abs: 0.3 10*3/uL — ABNORMAL LOW (ref 0.7–4.0)
MCHC: 32.4 g/dL (ref 30.0–36.0)
MCV: 92.1 fl (ref 78.0–100.0)
Monocytes Absolute: 0.5 10*3/uL (ref 0.1–1.0)
Monocytes Relative: 11.2 % (ref 3.0–12.0)
Neutro Abs: 3.3 10*3/uL (ref 1.4–7.7)
Neutrophils Relative %: 77.4 % — ABNORMAL HIGH (ref 43.0–77.0)
Platelets: 194 10*3/uL (ref 150.0–400.0)
RBC: 4.38 Mil/uL (ref 3.87–5.11)
RDW: 13.8 % (ref 11.5–15.5)
WBC: 4.3 10*3/uL (ref 4.0–10.5)

## 2023-06-09 LAB — COMPREHENSIVE METABOLIC PANEL
ALT: 13 U/L (ref 0–35)
AST: 16 U/L (ref 0–37)
Albumin: 4.2 g/dL (ref 3.5–5.2)
Alkaline Phosphatase: 75 U/L (ref 39–117)
BUN: 16 mg/dL (ref 6–23)
CO2: 32 mEq/L (ref 19–32)
Calcium: 9.8 mg/dL (ref 8.4–10.5)
Chloride: 101 mEq/L (ref 96–112)
Creatinine, Ser: 0.72 mg/dL (ref 0.40–1.20)
GFR: 85.38 mL/min (ref >60.00)
Glucose, Bld: 99 mg/dL (ref 70–99)
Potassium: 4.3 mEq/L (ref 3.5–5.1)
Sodium: 140 mEq/L (ref 135–145)
Total Bilirubin: 0.8 mg/dL (ref 0.2–1.2)
Total Protein: 7.3 g/dL (ref 6.0–8.3)

## 2023-06-09 LAB — HEMOGLOBIN A1C: Hgb A1c MFr Bld: 5.5 % (ref 4.6–6.5)

## 2023-06-09 LAB — LIPID PANEL
Cholesterol: 138 mg/dL (ref 0–200)
HDL: 43 mg/dL (ref >39.00)
LDL Cholesterol: 74 mg/dL (ref 0–99)
NonHDL: 95.03
Total CHOL/HDL Ratio: 3
Triglycerides: 104 mg/dL (ref 0.0–149.0)
VLDL: 20.8 mg/dL (ref 0.0–40.0)

## 2023-06-09 LAB — VITAMIN D 25 HYDROXY (VIT D DEFICIENCY, FRACTURES): VITD: 47.23 ng/mL (ref 30.00–100.00)

## 2023-06-09 MED ORDER — POTASSIUM CHLORIDE CRYS ER 20 MEQ PO TBCR
20.0000 meq | EXTENDED_RELEASE_TABLET | Freq: Every day | ORAL | 3 refills | Status: DC
  Filled 2023-06-09 – 2023-07-09 (×2): qty 90, 90d supply, fill #0
  Filled 2023-10-06: qty 90, 90d supply, fill #1
  Filled 2024-01-02: qty 90, 90d supply, fill #2
  Filled 2024-04-12: qty 90, 90d supply, fill #3

## 2023-06-09 NOTE — Progress Notes (Signed)
Phone [phone]   Subjective:  Patient presents today for their annual physical. Chief complaint-noted.   See problem oriented charting- ROS- full  review of systems was completed and negative Per full ROS sheet completed by patient other than baseline MS issues  The following were reviewed and entered/updated in epic: Past Medical History:  Diagnosis Date   Allergy    Arthritis    knees   Crohn's disease (HCC)    colon resection in 20. Dr. Leone Payor unsure of diagnosis- no evidence on colonoscopy   Heart murmur    Hx of adenomatous colonic polyps 03/17/2018   Hyperlipidemia    Hypertension    Multiple sclerosis (HCC)    Neuromuscular disorder (HCC) 11/2021   Diagnosed with Late Onset Multiple Sclerosis   Uterine cancer (HCC) 1989   s/p hysterectomy   Patient Active Problem List   Diagnosis Date Noted   Multiple sclerosis (HCC) 12/23/2021    Priority: High   Morbid obesity (HCC) 03/07/2011    Priority: High   Vitamin D deficiency 12/23/2021    Priority: Medium    Osteopenia 05/21/2020    Priority: Medium    Hx of adenomatous colonic polyps 03/17/2018    Priority: Medium    Family history of cerebrovascular accident (CVA) in sister 10/06/2017    Priority: Medium    Hyperglycemia 04/07/2016    Priority: Medium    Crohn's disease (HCC)     Priority: Medium    Hyperlipidemia 06/02/2007    Priority: Medium    Essential hypertension 06/02/2007    Priority: Medium    S/P cervical spinal fusion 12/23/2021    Priority: Low   Hearing loss 11/11/2019    Priority: Low   S/P total hysterectomy 04/14/2018    Priority: Low   Chest pain 03/30/2014    Priority: Low   Osteoarthritis of both knees 01/10/2010    Priority: Low   Edema 01/10/2010    Priority: Low   Allergic rhinitis 06/02/2007    Priority: Low   Numbness 03/26/2022   Gait disturbance 03/26/2022   High risk medication use 12/23/2021   Occipital neuralgia of left side 12/23/2021   Spinal stenosis in  cervical region 12/18/2020   Past Surgical History:  Procedure Laterality Date   ABDOMINAL HYSTERECTOMY  1989   Total abdominal, uterine cancer   APPENDECTOMY     BREAST BIOPSY Left 03/2019   fatty tissue   COLON SURGERY  1978-79   COLONOSCOPY     EYE SURGERY  July & Aug 2020   Bilateral Cataracts removed   OOPHORECTOMY     x1   SPINE SURGERY  03/01/2021   Anterior Cervical Discectomy C5,6,7 with Cadeverous Bone Fusion   total hysterectomy     WISDOM TOOTH EXTRACTION      Family History  Problem Relation Age of Onset   Uterine cancer Mother    Alzheimer's disease Mother        died 12/03/19  Diabetes Mother    Arthritis Mother    Cancer Mother    Hypertension Mother    Heart disease Father    Hypertension Father    Cancer Father        b cell lymphoma   CVA Father        passed from stroke 13   AVM Sister        8 years younger. brain. significant in hospital 6 weeks. Ltach/trach feeding tube   Breast cancer Maternal Grandmother    Cancer  Maternal Grandmother    Diabetes Brother    Diabetes Brother    Obesity Brother    Colon cancer Neg Hx    Esophageal cancer Neg Hx    Stomach cancer Neg Hx     Medications- reviewed and updated Current Outpatient Medications  Medication Sig Dispense Refill   atenolol-chlorthalidone (TENORETIC) 50-25 MG tablet Take 1 tablet by mouth daily. 90 tablet 3   atorvastatin (LIPITOR) 20 MG tablet Take 1 tablet (20 mg total) by mouth daily. 90 tablet 3   Cholecalciferol (VITAMIN D3) 50 MCG (2000 UT) capsule Take by mouth daily.     fluticasone (FLONASE) 50 MCG/ACT nasal spray Place 1 spray into both nostrils daily. 48 g 3   loratadine (CLARITIN) 10 MG tablet Take 10 mg by mouth daily as needed.     Multiple Vitamins-Minerals (MULTIVITAMIN ADULTS 50+ PO) Take 1 tablet by mouth daily.     potassium chloride SA (KLOR-CON M) 20 MEQ tablet Take 1 tablet (20 mEq total) by mouth daily. 90 tablet 3   No current facility-administered  medications for this visit.    Allergies-reviewed and updated Allergies  Allergen Reactions   Meperidine Hcl Anaphylaxis   Morphine    Percocet [Oxycodone-Acetaminophen]     rash   Mupirocin Rash   Sulfamethoxazole-Trimethoprim Rash    Social History   Social History Narrative   Married 47 years in 2020. 3 children. 7 grandchildren. 4 greatgrandchildren.    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 Oropharynx normal. Cerumen on left side, normal on right after hearing aide removed Neck: no thyromegaly CV: RRR no murmurs rubs or gallops Lungs: CTAB no crackles, wheeze, rhonchi Abdomen: soft/nontender/nondistended/normal bowel sounds. No rebound or guarding.  Ext: no edema Skin: warm, dry Neuro: grossly normal, moves all extremities, PERRLA   Assessment and Plan   67 y.o. female presenting for annual physical.  Health Maintenance counseling: 1. Anticipatory guidance: Patient counseled regarding regular dental exams -q6 months, eye exams -yearly,  avoiding smoking and second hand smoke , limiting alcohol to 1 beverage per day- doesn't drink , no illicit drugs .   2. Risk factor reduction:  Advised patient of need for regular exercise and diet rich and fruits and vegetables to reduce risk of heart attack and stroke.  Exercise- MS has been barrier- doing virtual classes with senior resources and tries to walk perimeter of yard and gardening- also going to bog garden and enjoys that.  Diet/weight management-up 4 lbs in last year but within her typical variations- down 7 lbs from visit with Dr. Epimenio Foot though- working hard- dietary  adjustments.  -shed like to get BMI under 35 if possible Wt Readings from Last 3 Encounters:  06/09/23 210 lb 9.6 oz (95.5 kg)  05/18/23 217 lb (98.4 kg)  03/11/23 217 lb 8 oz (98.7 kg)  3. Immunizations/screenings/ancillary studies- fall flu and COVID shot- will let us know Immunization History  Administered Date(s) Administered   Fluad Quad(high Dose 65+) 07/25/2019   Influenza Split 07/21/2011, 08/06/2012  Influenza Whole 08/24/2009   Influenza, High Dose Seasonal PF 07/24/2020, 07/25/2021, 07/24/2022   Influenza, Seasonal, Injecte, Preservative Fre 07/18/2015   Influenza,inj,Quad PF,6+ Mos 07/27/2013   Influenza-Unspecified 07/27/2014, 08/01/2016, 07/27/2017, 08/09/2018, 07/25/2019   Moderna Sars-Covid-2 Vaccination 01/10/2020, 02/07/2020, 11/27/2020   Pneumococcal Conjugate-13 05/21/2020   Pneumococcal Polysaccharide-23 05/11/2019   Td 10/20/2005   Tdap 04/07/2016   Zoster Recombinant(Shingrix) 04/27/2018, 07/28/2018   Zoster, Live 03/15/2014  4. Cervical cancer screening- hysterectomy for uterine cancer and removed cervix plus past age based screening recommendations  5. Breast cancer screening-  breast exam = prefers self exams= and mammogram 08/20/23 6. Colon cancer screening - scheduled in September with Dr. Leone Payor  7. Skin cancer screening- Dr. Margo Aye in past- no recent visits - advised regular sunscreen use. Denies worrisome, changing, or new skin lesions.  8. Birth control/STD check- hysterectomy/postmenopausal/only active with husband  9. Osteoporosis screening at 65- slight osteopenia at 63 and 66- likely recheck next year- see below 10. Smoking associated screening - Never smoker  Status of chronic or acute concerns   #Multiple sclerosis-originally diagnosed after MRI every 20 2023 of cervical spine with MRI of the brain with foci consistent with MS.  Following closely with Dr. Epimenio Foot.  On Mavenclad for 2 years through April 2024  #hypertension S: medication:  atenolol-chlorthalidone 50-25Mg  BP Readings from Last 3 Encounters:  06/09/23 120/72  03/11/23 (!) 145/75  12/09/22 122/72  A/P: stable- continue current medicines   #hyperlipidemia S: Medication: Atorvastatin 20Mg  Lab Results  Component Value Date   CHOL 132 06/03/2022   HDL 47.20 06/03/2022   LDLCALC 69 06/03/2022   LDLDIRECT 63.0 11/09/2018   TRIG 83.0 06/03/2022   CHOLHDL 3 06/03/2022   A/P: hopefully stable- update lipid panel today. Continue current meds for now   # Hyperglycemia/insulin resistance/prediabetes- peak a1c of 5.8.  #morbid obesity-peak weight 237- has done well getting down to 210 S:  Medication: none Lab Results  Component Value Date   HGBA1C 5.4 12/09/2022   HGBA1C 5.8 06/03/2022   HGBA1C 5.6 12/02/2021  A/P: hopefully stable- update a1c today. Continue without meds for now   #crohn's disease S:  Colon resection in 20s. Dr. Leone Payor is not certain this is acurate diagnosis. No evidence on repeat colonoscopy. No blood in stool or abdominal cramping recently - some diarrhea on mavenclad even with immodium A/P: still unclear diagnosis but scheduled for colonoscopy  #Right knee pain likely from osteoarthritis- prior injections- sparing voltaren helps some. Tries to do her exercises  #Osteopenia/low bone density-DEXA 06/04/2022 with worst T score -1.5 at right femoral neck but not in range for prescription medication- will recheck in 2025 likely . Still on D3   #Vitamin D deficiency S: Medication: 2000 units Last vitamin D Lab Results  Component Value Date   VD25OH 80.8 12/23/2021  A/P: update levels today- suspect controlled  # allergies- not needing Flonase over summre- claritin holding things at bay  Recommended follow up: Return in about 6 months (around 12/10/2023) for followup or sooner if needed.Schedule b4 you leave. Future Appointments  Date Time Provider Department Center  06/25/2023 10:30 AM Sater, Pearletha Furl, MD [facility] None  06/26/2023 10:30 AM  [facility] PREVISIT RM 51 [facility]  07/13/2023  8:30 AM Iva Boop, MD [facility]  05/23/2024  9:30 AM [facility] ANNUAL WELLNESS VISIT 1 [facility]   Lab/Order associations: fasting   ICD-10-CM   1. Preventative health care  Z00.00     2. Multiple sclerosis (HCC)  G35  3. Essential hypertension  I10     4. Hyperlipidemia, unspecified hyperlipidemia type  E78.5 Comprehensive metabolic panel    CBC with Differential/Platelet    Lipid panel    5. Hyperglycemia  R73.9 Hemoglobin A1c    6. Screening for diabetes mellitus  Z13.1 Hemoglobin A1c    7. Crohn's disease with complication, unspecified gastrointestinal tract location (HCC)  K50.919     8. Vitamin D deficiency  E55.9 VITAMIN D 25 Hydroxy (Vit-D Deficiency, Fractures)     Meds ordered this encounter  Medications   potassium chloride SA (KLOR-CON M) 20 MEQ tablet    Sig: Take 1 tablet (20 mEq total) by mouth daily.    Dispense:  90 tablet    Refill:  3   Return precautions advised.  Tana Conch, MD

## 2023-06-09 NOTE — Patient Instructions (Addendum)
Health Maintenance Due  Topic Date Due   Colonoscopy  03/10/2023  Thanks for getting this done in September  Please stop by lab before you go If you have mychart- we will send your results within 3 business days of [place] receiving them.  If you do not have mychart- we will call you about results within 5 business days of [place] receiving them.  *please also note that you will see labs on mychart as soon as they post. I will later go in and write notes on them- will say "notes from Dr. Durene Cal"   Recommended follow up: Return in about 6 months (around 12/10/2023) for followup or sooner if needed.Schedule b4 you leave.

## 2023-06-15 ENCOUNTER — Encounter: Payer: Self-pay | Admitting: Family Medicine

## 2023-06-15 ENCOUNTER — Telehealth (INDEPENDENT_AMBULATORY_CARE_PROVIDER_SITE_OTHER): Payer: Medicare Other | Admitting: Family Medicine

## 2023-06-15 VITALS — BP 124/76 | Temp 97.4°F | Ht 62.0 in | Wt 210.0 lb

## 2023-06-15 DIAGNOSIS — U071 COVID-19: Secondary | ICD-10-CM

## 2023-06-15 MED ORDER — NIRMATRELVIR/RITONAVIR (PAXLOVID)TABLET: 3.0000 | ORAL_TABLET | Freq: Two times a day (BID) | ORAL | 0 refills | Status: AC

## 2023-06-15 MED ORDER — BENZONATATE 100 MG PO CAPS: 100.0000 mg | ORAL_CAPSULE | Freq: Two times a day (BID) | ORAL | 0 refills | Status: DC | PRN

## 2023-06-15 MED ORDER — GUAIFENESIN ER 600 MG PO TB12: 1200.0000 mg | ORAL_TABLET | Freq: Two times a day (BID) | ORAL | 0 refills | Status: DC

## 2023-06-15 NOTE — Progress Notes (Signed)
Covid Positive - this morning (Husband positive)  Symptoms started last night Scratchy throat Runny nose Cough  Took tylenol 1000 mg this morning, warm salt water gargle   Has MS (has just completed two years of immunosuppressants)

## 2023-06-15 NOTE — Patient Instructions (Addendum)
Adding Paxlovid. Information sheet attached. Stop your cholesterol medicine (atorvastatin) while you are taking Paxlovid and for 5 days after finishing the Paxlovid.   Over the counter medications that may be helpful for symptoms:  Guaifenesin 1200 mg extended release tabs twice daily, with plenty of water For cough and congestion Brand name: Mucinex   Pseudoephedrine 30 mg, one or two tabs every 4 to 6 hours For sinus congestion Brand name: Sudafed You must get this from the pharmacy counter.  Oxymetazoline nasal spray each morning, one spray in each nostril, for NO MORE THAN 3 days  For nasal and sinus congestion Brand name: Afrin Saline nasal spray or Saline Nasal Irrigation 3-5 times a day For nasal and sinus congestion Brand names: Ocean or AYR Fluticasone nasal spray, one spray in each nostril, each morning (after oxymetazoline and saline, if used) For nasal and sinus congestion Brand name: Flonase Warm salt water gargles  For sore throat Every few hours as needed Alternate ibuprofen 400-600 mg and acetaminophen 1000 mg every 4-6 hours For fever, body aches, headache Brand names: Motrin or Advil and Tylenol Dextromethorphan 12-hour cough version 30 mg every 12 hours  For cough Brand name: Delsym Stop all other cold medications for now (Nyquil, Dayquil, Tylenol Cold, Theraflu, etc) and other non-prescription cough/cold preparations. Many of these have the same ingredients listed above and could cause an overdose of medication.   Herbal treatments that have been shown to be helpful in some patients include: Vitamin C 1000mg  per day Vitamin D 4000iU per day Zinc 100mg  per day Quercetin 25-500mg  twice a day Melatonin 5-10mg  at bedtime  General Instructions Allow your body to rest Drink PLENTY of fluids Isolate yourself from everyone, even family, until test results have returned  If your COVID-19 test is positive Then you ARE INFECTED and you can pass the virus to  others You must quarantine from others for a minimum of  5 days since symptoms started AND You are fever free for 24 hours WITHOUT any medication to reduce fever AND Your symptoms are improving Wear mask for the next 5 days If not improved by day 5, quarantine for the full 10 days. Do not go to the store or other public areas Do not go around household members who are not known to be infected with COVID-19 If you MUST leave your area of quarantine (example: go to a bathroom you share with others in your home), you must Wear a mask Wash your hands thoroughly Wipe down any surfaces you touch Do not share food, drinks, towels, or other items with other persons Dispose of your own tissues, food containers, etc  Once you have recovered, please continue good preventive care measures, including:  wearing a mask when in public wash your hands frequently avoid touching your face/nose/eyes cover coughs/sneezes with the inside of your elbow stay out of crowds keep a 6 foot distance from others  If you develop severe shortness of breath, uncontrolled fevers, coughing up blood, confusion, chest pain, or signs of dehydration (such as significantly decreased urine amounts or dizziness with standing) please go to the ER.

## 2023-06-15 NOTE — Telephone Encounter (Signed)
Patient has been scheduled for vv with Hyman Hopes on 8/26 @ 3:20pm.

## 2023-06-15 NOTE — Progress Notes (Signed)
Virtual Video Visit via MyChart Note  I connected with  Savannah Morrow on 06/15/23 at  3:20 PM EDT by the video enabled telemedicine application for MyChart, and verified that I am speaking with the correct person using two identifiers.   I introduced myself as a Publishing rights manager with the practice. We discussed the limitations of evaluation and management by telemedicine and the availability of in person appointments. The patient expressed understanding and agreed to proceed.  Participating parties in this visit include: The patient and the nurse practitioner listed.  The patient is: At home I am: In the office - [facility]  Subjective:    CC:  Chief Complaint  Patient presents with   Covid Positive    HPI: Savannah Morrow is a 67 y.o. year old female presenting today via MyChart today for COVID infection.   Discussed the use of AI scribe software for clinical note transcription with the patient, who gave verbal consent to proceed.  History of Present Illness   The patient, with a history of multiple sclerosis (MS) tested positive for COVID-19. She started experiencing symptoms the previous night, including a scratchy throat, runny nose, and coughing. By the morning, the patient described the nasal discharge as 'like a water faucet.' She also reported generalized body aches, particularly in the legs and feet, which were severe enough to warrant the use of Tylenol, a medication she had not used in over two years. The patient denied fever and difficulty breathing, except when wearing a mask for extended periods.  The patient's husband, who had been in contact with a confirmed COVID-19 case, tested positive for the virus the day before the patient's symptoms began. The patient's husband was prescribed Paxlovid for his symptoms. The patient had a previous COVID-19 infection in November 2020.        Past medical history, Surgical history, Family history not  pertinant except as noted below, Social history, Allergies, and medications have been entered into the medical record, reviewed, and corrections made.   Review of Systems:  All review of systems negative except what is listed in the HPI   Objective:    General:  Speaking clearly in complete sentences. Absent shortness of breath noted.   Alert and oriented x3.   Normal judgment.  Absent acute distress.   Impression and Recommendations:    1. COVID-19 - nirmatrelvir/ritonavir (PAXLOVID) 20 x 150 MG & 10 x 100MG  TABS; Take 3 tablets by mouth 2 (two) times daily for 5 days. (Take nirmatrelvir 150 mg two tablets twice daily for 5 days and ritonavir 100 mg one tablet twice daily for 5 days) Patient GFR is 85  Dispense: 30 tablet; Refill: 0 - guaiFENesin (MUCINEX) 600 MG 12 hr tablet; Take 2 tablets (1,200 mg total) by mouth 2 (two) times daily.  Dispense: 30 tablet; Refill: 0 - benzonatate (TESSALON) 100 MG capsule; Take 1 capsule (100 mg total) by mouth 2 (two) times daily as needed for Morrow.  Dispense: 20 capsule; Refill: 0  Adding Paxlovid. Information sheet attached. Stop your cholesterol medicine (atorvastatin) while you are taking Paxlovid and for 5 days after finishing the Paxlovid.  Adding Mucinex and Tessalon.  Continue supportive measures including rest, hydration, humidifier use, steam showers, warm compresses to sinuses, warm liquids with lemon and honey, and over-the-counter Morrow, cold, and analgesics as needed.  Patient aware of signs/symptoms requiring further/urgent evaluation.    Follow-up if symptoms worsen or fail to improve.  I discussed the assessment and treatment plan with the patient. The patient was provided an opportunity to ask questions and all were answered. The patient agreed with the plan and demonstrated an understanding of the instructions.   The patient was advised to call back or seek an in-person evaluation if the symptoms worsen or if the condition  fails to improve as anticipated.    Clayborne Dana, NP

## 2023-06-25 ENCOUNTER — Ambulatory Visit: Payer: Medicare Other | Admitting: Neurology

## 2023-06-25 ENCOUNTER — Encounter: Payer: Self-pay | Admitting: Neurology

## 2023-06-25 VITALS — BP 144/72 | HR 57 | Ht 62.0 in | Wt 207.5 lb

## 2023-06-25 DIAGNOSIS — Z79899 Other long term (current) drug therapy: Secondary | ICD-10-CM

## 2023-06-25 DIAGNOSIS — R269 Unspecified abnormalities of gait and mobility: Secondary | ICD-10-CM

## 2023-06-25 DIAGNOSIS — R519 Headache, unspecified: Secondary | ICD-10-CM

## 2023-06-25 DIAGNOSIS — E559 Vitamin D deficiency, unspecified: Secondary | ICD-10-CM | POA: Diagnosis not present

## 2023-06-25 DIAGNOSIS — G35 Multiple sclerosis: Secondary | ICD-10-CM | POA: Diagnosis not present

## 2023-06-25 NOTE — Progress Notes (Signed)
[facility]  PATIENT: Savannah Morrow DOB: 19-Nov-1955  REFERRING DOCTOR OR PCP: Coletta Memos MD (neurosurgery); Tana Conch, MD (PCP) SOURCE: Patient, notes from Dr. Franky Macho, imaging and lab reports, MRI images personally reviewed.  _________________________________   HISTORICAL  CHIEF COMPLAINT:  Chief Complaint  Patient presents with   Follow-up    Pt in room 11. Here for MS follow up. Pt said doing well, finished mavenclad in April.     HISTORY OF PRESENT ILLNESS:  Savannah Morrow is a 67 y.o. woman with multiple sclerosis.  Update 06/25/2023 She did her 1st year of Mavenclad late March and late April 2023 and second year March and April 2024 (last pill 02/07/2023). She had stomach pain and nasal blisters with the second year.  This recovered over 2 weeks.   Her fatigue was worse a couple months and now closer to baseline.  She was able to do garden this summer.   No infections or other issues.   CBC/D showed a lymphocyte count of 0.3, 2 weeks ago (06/09/2023)  Balance and gait are doing a little better.    She uses a cane for safety but walks some without it.  She uses a bannister on stairs.   She tries to walk a mile a day as exercise and with regular walking usually does > 5000 steps a day if no rain.    She notes left facial and bilateral leg pins/needles senation but less than last year.    Vision is stable.   No color desaturation.   She saw Dr. Dione Booze recently and there was no evidence of demyelinating change.    She has has mild stress incontinence (cough/laugh) for many years.  Some nocturia, imipramine not well tolerated   She wears a left hearing aid     She has fatigue the past few months.    This is worse with heat.   She sleeps well most nights.   She denies any cognitive issues.   She notes no depression or anxiety.      She takes Vit D supplement  MS History She had lower cervical spina and right muscle spasms and pain and numbness in the right  arm in C6 dermatome.   She also noted right  hip pain and sometimes the leg would almost give out.   She had surgery May 2022.    She developed Covid-19 during 08/2021.  She felt weaker afterwards and had left neck pain.   She noted an electric shock sensation going down the left neck.     She felt better using an ice pack.     In January she began to have reduced balance and she had pain in the left jaw and left facial numbness.   She improved some but still had pain and had a follow-up MRI 12/09/2021.    It showed the ACDF and a focus c/w MS.    MRI of the brain also showed foci c/w MS.     She is referred for further evaluation and treatment  Several of her mother's cousins have MS.  Her sister had an AVM causing a cerebral hemorrhage.      She is fairly healthy with HTN and borderline DM.   She is trying to lose weight.     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  She had multiple CXR afterwards - all fine.   Last one 2010  Imaging review  MRI of  the cervical spine 12/09/2021 shows a T2 hyperintense focus laterally to the right and the spinal cord adjacent to C7 and T1.   Additionally, there is a left posterolateral focus adjacent to C1-C2 that also appeared to be present on the 2022 MRI.   On the 2023 MRI, she is status post C5-C7 ACDF.  This study was compared to the MRI from 12/15/2020.  At that time, she had significant degenerative changes at C5-C6 and C6-C7 and also had spinal stenosis and foraminal narrowing at those levels.  The 2 probable demyelinating foci are present on this MRI.  MRI of the brain 12/09/2021 showed scattered T2/FLAIR hyperintense foci in the periventricular, juxtacortical and deep white matter of the hemispheres in a pattern compatible with multiple sclerosis.  There may be 1 small hyperintensity in the left basis pontis.  This study was done without contrast.    REVIEW OF SYSTEMS: Constitutional: No fevers, chills, sweats, or change in appetite Eyes: No  visual changes, double vision, eye pain Ear, nose and throat: No hearing loss, ear pain, nasal congestion, sore throat Cardiovascular: No chest pain, palpitations Respiratory:  No shortness of breath at rest or with exertion.   No wheezes GastrointestinaI: No nausea, vomiting, diarrhea, abdominal pain, fecal incontinence Genitourinary:  No dysuria, urinary retention or frequency.  No nocturia. Musculoskeletal:  No neck pain, back pain Integumentary: No rash, pruritus, skin lesions Neurological: as above Psychiatric: No depression at this time.  No anxiety Endocrine: No palpitations, diaphoresis, change in appetite, change in weigh or increased thirst Hematologic/Lymphatic:  No anemia, purpura, petechiae. Allergic/Immunologic: No itchy/runny eyes, nasal congestion, recent allergic reactions, rashes  ALLERGIES: Allergies  Allergen Reactions   Meperidine Hcl Anaphylaxis   Morphine    Percocet [Oxycodone-Acetaminophen]     rash   Mupirocin Rash   Sulfamethoxazole-Trimethoprim Rash    HOME MEDICATIONS:  Current Outpatient Medications:    atenolol-chlorthalidone (TENORETIC) 50-25 MG tablet, Take 1 tablet by mouth daily., Disp: 90 tablet, Rfl: 3   atorvastatin (LIPITOR) 20 MG tablet, Take 1 tablet (20 mg total) by mouth daily., Disp: 90 tablet, Rfl: 3   Cholecalciferol (VITAMIN D3) 50 MCG (2000 UT) capsule, Take by mouth daily., Disp: , Rfl:    fluticasone (FLONASE) 50 MCG/ACT nasal spray, Place 1 spray into both nostrils daily., Disp: 48 g, Rfl: 3   loratadine (CLARITIN) 10 MG tablet, Take 10 mg by mouth daily as needed., Disp: , Rfl:    Multiple Vitamins-Minerals (MULTIVITAMIN ADULTS 50+ PO), Take 1 tablet by mouth daily., Disp: , Rfl:    potassium chloride SA (KLOR-CON M) 20 MEQ tablet, Take 1 tablet (20 mEq total) by mouth daily., Disp: 90 tablet, Rfl: 3   benzonatate (TESSALON) 100 MG capsule, Take 1 capsule (100 mg total) by mouth 2 (two) times daily as needed for cough., Disp: 20  capsule, Rfl: 0   guaiFENesin (MUCINEX) 600 MG 12 hr tablet, Take 2 tablets (1,200 mg total) by mouth 2 (two) times daily., Disp: 30 tablet, Rfl: 0  PAST MEDICAL HISTORY: Past Medical History:  Diagnosis Date   Allergy    Arthritis    knees   Crohn's disease (HCC)    colon resection in 20. Dr. Leone Payor unsure of diagnosis- no evidence on colonoscopy   Heart murmur    Hx of adenomatous colonic polyps 03/17/2018   Hyperlipidemia    Hypertension    Multiple sclerosis (HCC)    Neuromuscular disorder (HCC) 11/2021   Diagnosed with Late Onset Multiple Sclerosis  Uterine cancer (HCC) 1989   s/p hysterectomy    PAST SURGICAL HISTORY: Past Surgical History:  Procedure Laterality Date   ABDOMINAL HYSTERECTOMY  1989   Total abdominal, uterine cancer   APPENDECTOMY     BREAST BIOPSY Left 03/2019   fatty tissue   COLON SURGERY  1978-79   COLONOSCOPY     EYE SURGERY  July & Aug 2020   Bilateral Cataracts removed   OOPHORECTOMY     x1   SPINE SURGERY  03/01/2021   Anterior Cervical Discectomy C5,6,7 with Cadeverous Bone Fusion   total hysterectomy     WISDOM TOOTH EXTRACTION      FAMILY HISTORY: Family History  Problem Relation Age of Onset   Uterine cancer Mother    Alzheimer's disease Mother        died 2019/11/17  Diabetes Mother    Arthritis Mother    Cancer Mother    Hypertension Mother    Heart disease Father    Hypertension Father    Cancer Father        b cell lymphoma   CVA Father        passed from stroke 34   AVM Sister        8 years younger. brain. significant in hospital 6 weeks. Ltach/trach feeding tube   Breast cancer Maternal Grandmother    Cancer Maternal Grandmother    Diabetes Brother    Diabetes Brother    Obesity Brother    Colon cancer Neg Hx    Esophageal cancer Neg Hx    Stomach cancer Neg Hx     SOCIAL HISTORY:  Social History   Socioeconomic History   Marital status: Married    Spouse name: [name]   Number of children: 3    Years of education: Not on file   Highest education level: Bachelor's degree (e.g., BA, AB, BS)  Occupational History   Occupation: retired  Tobacco Use   Smoking status: Never   Smokeless tobacco: Never  Vaping Use   Vaping status: Never Used  Substance and Sexual Activity   Alcohol use: No   Drug use: No   Sexual activity: Yes    Birth control/protection: Post-menopausal  Other Topics Concern   Not on file  Social History Narrative   Married 47 years in 2020. 3 children. 7 grandchildren. 4 greatgrandchildren.    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, multimedia work 2010, last worked with [employer]   Hobbies: working in garden, camping in RV   Right handed   Caffeine: zero   Social Determinants of Health   Financial Resource Strain: Low Risk  (05/14/2023)   Overall Financial Resource Strain (CARDIA)    Difficulty of Paying Living Expenses: Not hard at all  Food Insecurity: No Food Insecurity (05/14/2023)   Hunger Vital Sign    Worried About Running Out of Food in the Last Year: Never true    Ran Out of Food in the Last Year: Never true  Transportation Needs: No Transportation Needs (05/14/2023)   PRAPARE - Administrator, Civil Service (Medical): No    Lack of Transportation (Non-Medical): No  Physical Activity: Sufficiently Active (05/14/2023)   Exercise Vital Sign    Days of Exercise per Week: 6 days    Minutes of Exercise per Session: 40 min  Stress: No Stress Concern Present (05/14/2023)   [employer] of Occupational Health - Occupational Stress Questionnaire    Feeling of Stress : Not at all  Social Connections: Socially Integrated (05/14/2023)   Social Connection and Isolation Panel [NHANES]    Frequency of Communication with Friends and Family: Three times a week    Frequency of Social Gatherings with Friends and Family: Twice a week    Attends Religious Services: More than 4 times per year    Active Member of Clubs or Organizations: Yes    Attends Tax inspector Meetings: More than 4 times per year    Marital Status: Married  Catering manager Violence: Not At Risk (05/18/2023)   Humiliation, Afraid, Rape, and Kick questionnaire    Fear of Current or Ex-Partner: No    Emotionally Abused: No    Physically Abused: No    Sexually Abused: No     PHYSICAL EXAM  Vitals:   06/25/23 1329 06/25/23 1334  BP: (!) 143/69 (!) 144/72  Pulse: (!) 53 (!) 57  Weight: 207 lb 8 oz (94.1 kg)   Height: 5\' 2"  (1.575 m)     Body mass index is 37.95 kg/m.   General: The patient is well-developed and well-nourished and in no acute distress  HEENT:  Head is [place]/AT.  Sclera are anicteric.   Skin: Extremities are without rash or  edema.  Musculoskeletal:  Back and neck are nontender  Neurologic Exam  Mental status: The patient is alert and oriented x 3 at the time of the examination. The patient has apparent normal recent and remote memory, with an apparently normal attention span and concentration ability.   Speech is normal.  Cranial nerves: Extraocular movements are full. Pupils are equal, round, and reactive to light and accomodation.  Facial strength and sensation are fine . No obvious hearing deficits are noted.  Motor:  Muscle bulk is normal.   Tone is normal. Strength is  5 / 5 in all 4 extremities except 4+/5 riht iliopsoas and toes.   Sensory: Sensory testing is intact to pinprick, soft touch and vibration sensation in all 4 extremities.  Coordination: Cerebellar testing reveals good finger-nose-finger and reduced right heel-to-shin bilaterally.  Gait and station: Station is normal.   Gait is mildly wide and she turns in 3 steps.  Tandem gait is  mildly wide .    Romberg is negative.   Reflexes: Deep tendon reflexes are symmetric and normal bilaterally in arms.       DIAGNOSTIC DATA (LABS, IMAGING, TESTING) - I reviewed patient records, labs, notes, testing and imaging myself where available.  Lab Results  Component Value Date    WBC 4.3 06/09/2023   HGB 13.1 06/09/2023   HCT 40.4 06/09/2023   MCV 92.1 06/09/2023   PLT 194.0 06/09/2023      Component Value Date/Time   NA 140 06/09/2023 0917   NA 140 03/11/2023 1420   K 4.3 06/09/2023 0917   CL 101 06/09/2023 0917   CO2 32 06/09/2023 0917   GLUCOSE 99 06/09/2023 0917   GLUCOSE 101 (H) 10/22/2006 1217   BUN 16 06/09/2023 0917   BUN 14 03/11/2023 1420   CREATININE 0.72 06/09/2023 0917   CREATININE 0.79 07/31/2020 1031   CALCIUM 9.8 06/09/2023 0917   PROT 7.3 06/09/2023 0917   PROT 6.5 03/11/2023 1420   ALBUMIN 4.2 06/09/2023 0917   ALBUMIN 4.2 03/11/2023 1420   AST 16 06/09/2023 0917   ALT 13 06/09/2023 0917   ALKPHOS 75 06/09/2023 0917   BILITOT 0.8 06/09/2023 0917   BILITOT 0.7 03/11/2023 1420   GFRNONAA 78.90 01/04/2010 0813   GFRAA 84  10/26/2008 0949   Lab Results  Component Value Date   CHOL 138 06/09/2023   HDL 43.00 06/09/2023   LDLCALC 74 06/09/2023   LDLDIRECT 63.0 11/09/2018   TRIG 104.0 06/09/2023   CHOLHDL 3 06/09/2023   Lab Results  Component Value Date   HGBA1C 5.5 06/09/2023   No results found for: "VITAMINB12" Lab Results  Component Value Date   TSH 2.26 04/07/2016       ASSESSMENT AND PLAN  Multiple sclerosis (HCC)  High risk medication use  Gait disturbance  Vitamin D deficiency  Occipital headache  She has completed Mavenclad year Two.  Labs were ok - 0.3 lymphocytes.  She has been vaccinated for shingles (Shingrix) so we will hold off on adding Valtrex..    Stay active and exercise.  She eats well.     Continue Vit D suppl.   HA's have resolved  Rtc 6 months, will recheck labs then   This visit is part of a comprehensive longitudinal care medical relationship regarding the patients primary diagnosis of MS and related concerns.   Laiklyn Pilkenton A. Epimenio Foot, MD, [facility] 06/25/2023, 2:05 PM Certified in Neurology, Clinical Neurophysiology, Sleep Medicine and Neuroimaging  [facility] [address], [address] [place], [place] [address] [phone]

## 2023-06-26 ENCOUNTER — Ambulatory Visit (AMBULATORY_SURGERY_CENTER): Payer: Medicare Other

## 2023-06-26 VITALS — Ht 62.0 in | Wt 207.4 lb

## 2023-06-26 DIAGNOSIS — Z86010 Personal history of colonic polyps: Secondary | ICD-10-CM

## 2023-06-26 NOTE — Progress Notes (Signed)
No egg or soy allergy known to patient  No issues known to pt with past sedation with any surgeries or procedures: PONV + headache after colonoscopy in 2019 Patient denies ever being told they had issues or difficulty with intubation  No FH of Malignant Hyperthermia Pt is not on diet pills Pt is not on  home 02  Pt is not on blood thinners  Pt denies issues with constipation  No A fib or A flutter Have any cardiac testing pending--no  LOA: independent  Prep: spilt dose miralax   Patient's chart reviewed by Cathlyn Parsons CNRA prior to previsit and patient appropriate for the [facility].  Previsit completed and red dot placed by patient's name on their procedure day (on provider's schedule).     PV competed with patient. Prep instructions sent via mychart and home address.

## 2023-06-30 ENCOUNTER — Encounter: Payer: Self-pay | Admitting: Internal Medicine

## 2023-07-04 ENCOUNTER — Encounter: Payer: Self-pay | Admitting: Certified Registered Nurse Anesthetist

## 2023-07-09 ENCOUNTER — Other Ambulatory Visit (HOSPITAL_COMMUNITY): Payer: Self-pay

## 2023-07-13 ENCOUNTER — Encounter: Payer: Self-pay | Admitting: Internal Medicine

## 2023-07-13 ENCOUNTER — Ambulatory Visit (AMBULATORY_SURGERY_CENTER): Payer: Medicare Other | Admitting: Internal Medicine

## 2023-07-13 VITALS — BP 129/75 | HR 60 | Temp 98.9°F | Resp 15 | Ht 62.0 in | Wt 207.0 lb

## 2023-07-13 DIAGNOSIS — Z86010 Personal history of colonic polyps: Secondary | ICD-10-CM | POA: Diagnosis not present

## 2023-07-13 DIAGNOSIS — Z09 Encounter for follow-up examination after completed treatment for conditions other than malignant neoplasm: Secondary | ICD-10-CM

## 2023-07-13 DIAGNOSIS — Z789 Other specified health status: Secondary | ICD-10-CM | POA: Insufficient documentation

## 2023-07-13 HISTORY — DX: Other specified health status: Z78.9

## 2023-07-13 MED ORDER — SODIUM CHLORIDE 0.9 % IV SOLN: 500.0000 mL | Freq: Once | INTRAVENOUS | Status: DC

## 2023-07-13 NOTE — Op Note (Addendum)
[facility] Patient Name: Savannah Morrow Procedure Date: 07/13/2023 8:53 AM MRN: [id number] Endoscopist: Iva Boop , MD, [id number] Age: 67 Referring MD:  Date of Birth: [birth year] Gender: Female Account #: [id number] Procedure:                Colonoscopy Indications:              Surveillance: Personal history of adenomatous                            polyps on last colonoscopy 5 years ago, Last                            colonoscopy: 2019 Medicines:                Monitored Anesthesia Care Procedure:                Pre-Anesthesia Assessment:                           - Prior to the procedure, a History and Physical                            was performed, and patient medications and                            allergies were reviewed. The patient's tolerance of                            previous anesthesia was also reviewed. The risks                            and benefits of the procedure and the sedation                            options and risks were discussed with the patient.                            All questions were answered, and informed consent                            was obtained. Prior Anticoagulants: The patient has                            taken no anticoagulant or antiplatelet agents. ASA                            Grade Assessment: II - A patient with mild systemic                            disease. After reviewing the risks and benefits,                            the patient was deemed in satisfactory condition to  undergo the procedure.                           After obtaining informed consent, the colonoscope                            was passed under direct vision. Throughout the                            procedure, the patient's blood pressure, pulse, and                            oxygen saturations were monitored continuously. The                            Olympus CF-HQ190L [id number]) Colonoscope was                             introduced through the anus and advanced to the the                            cecum, identified by appendiceal orifice and                            ileocecal valve. The colonoscopy was performed                            without difficulty. The patient tolerated the                            procedure well. The quality of the bowel                            preparation was good. The ileocecal valve,                            appendiceal orifice, and rectum were photographed.                            The bowel preparation used was Miralax via split                            dose instruction. Scope In: 8:58:49 AM Scope Out: 9:12:55 AM Scope Withdrawal Time: 0 hours 8 minutes 24 seconds  Total Procedure Duration: 0 hours 14 minutes 6 seconds  Findings:                 The perianal and digital rectal examinations were                            normal.                           Multiple diverticula were found in the sigmoid  colon.                           The exam was otherwise without abnormality on                            direct and retroflexion views. Complications:            No immediate complications. Estimated Blood Loss:     Estimated blood loss: none. Impression:               - Diverticulosis in the sigmoid colon.                           - The examination was otherwise normal on direct                            and retroflexion views.                           - No specimens collected.                           - Personal history of colonic polyps. 6 polyps 2019                            max 6 mm, mix hyperplastic and adenomas Recommendation:           - Patient has a contact number available for                            emergencies. The signs and symptoms of potential                            delayed complications were discussed with the                            patient. Return to normal activities tomorrow.                             Written discharge instructions were provided to the                            patient.                           - Resume previous diet.                           - Continue present medications.                           - No repeat colonoscopy due to age and the absence                            of colonic polyps today.Next routine would be after  age 65 - Note if needs other procedures is                            difficult IV stick. Iva Boop, MD 07/13/2023 9:23:02 AM This report has been signed electronically.

## 2023-07-13 NOTE — Progress Notes (Signed)
Pt's states no medical or surgical changes since previsit or office visit. 

## 2023-07-13 NOTE — Progress Notes (Signed)
Report given to PACU, vss 

## 2023-07-13 NOTE — Progress Notes (Signed)
1610 Patient experiencing nausea.  MD updated and Zofran 4 mg IV given, vss

## 2023-07-13 NOTE — Patient Instructions (Addendum)
Please read handouts provided. Continue present medications.   YOU HAD AN ENDOSCOPIC PROCEDURE TODAY AT THE [facility]:   Refer to the procedure report that was given to you for any specific questions about what was found during the examination.  If the procedure report does not answer your questions, please call your gastroenterologist to clarify.  If you requested that your care partner not be given the details of your procedure findings, then the procedure report has been included in a sealed envelope for you to review at your convenience later.  YOU SHOULD EXPECT: Some feelings of bloating in the abdomen. Passage of more gas than usual.  Walking can help get rid of the air that was put into your GI tract during the procedure and reduce the bloating. If you had a lower endoscopy (such as a colonoscopy or flexible sigmoidoscopy) you may notice spotting of blood in your stool or on the toilet paper. If you underwent a bowel prep for your procedure, you may not have a normal bowel movement for a few days.  Please Note:  You might notice some irritation and congestion in your nose or some drainage.  This is from the oxygen used during your procedure.  There is no need for concern and it should clear up in a day or so.  SYMPTOMS TO REPORT IMMEDIATELY:  Following lower endoscopy (colonoscopy or flexible sigmoidoscopy):  Excessive amounts of blood in the stool  Significant tenderness or worsening of abdominal pains  Swelling of the abdomen that is new, acute  Fever of 100F or higher.  For urgent or emergent issues, a gastroenterologist can be reached at any hour by calling [phone]. Do not use MyChart messaging for urgent concerns.    DIET:  We do recommend a small meal at first, but then you may proceed to your regular diet.  Drink plenty of fluids but you should avoid alcoholic beverages for 24 hours.  ACTIVITY:  You should plan to take it easy for the rest of today and you  should NOT DRIVE or use heavy machinery until tomorrow (because of the sedation medicines used during the test).    FOLLOW UP: Our staff will call the number listed on your records the next business day following your procedure.  We will call around 7:15- 8:00 am to check on you and address any questions or concerns that you may have regarding the information given to you following your procedure. If we do not reach you, we will leave a message.     If any biopsies were taken you will be contacted by phone or by letter within the next 1-3 weeks.  Please call us at [phone] if you have not heard about the biopsies in 3 weeks.    SIGNATURES/CONFIDENTIALITY: You and/or your care partner have signed paperwork which will be entered into your electronic medical record.  These signatures attest to the fact that that the information above on your After Visit Summary has been reviewed and is understood.  Full responsibility of the confidentiality of this discharge information lies with you and/or your care-partner.No polyps today.  You do have diverticulosis - common and not usually a problem. Please read the handout provided.  I am not putting you in for a routine repeat colonoscopy - would investigate signs or symptoms as needed.  I appreciate the opportunity to care for you. Iva Boop, MD, [facility]

## 2023-07-13 NOTE — Progress Notes (Signed)
[facility] History and Physical   Primary Care Physician:  Shelva Majestic, MD   Reason for Procedure:    Encounter Diagnosis  Name Primary?   Personal history of colonic polyps Yes     Plan:    colonoscopy     HPI: Savannah Morrow is a 67 y.o. female here for surveillance colonoscopy   02/2018 - 6 diminutive polyps some adenomas some hyperplastic   Past Surgical History:  Procedure Laterality Date   ABDOMINAL HYSTERECTOMY  1989   Total abdominal, uterine cancer   APPENDECTOMY     BREAST BIOPSY Left 03/2019   fatty tissue   COLON SURGERY  1978-79   COLONOSCOPY     EYE SURGERY  July & Aug 2020   Bilateral Cataracts removed   OOPHORECTOMY     x1   SPINE SURGERY  03/01/2021   Anterior Cervical Discectomy C5,6,7 with Cadeverous Bone Fusion   total hysterectomy     WISDOM TOOTH EXTRACTION      Prior to Admission medications   Medication Sig Start Date End Date Taking? Authorizing Provider  atenolol-chlorthalidone (TENORETIC) 50-25 MG tablet Take 1 tablet by mouth daily. 05/04/23  Yes Shelva Majestic, MD  atorvastatin (LIPITOR) 20 MG tablet Take 1 tablet (20 mg total) by mouth daily. 05/04/23  Yes Shelva Majestic, MD  Cholecalciferol (VITAMIN D3) 50 MCG (2000 UT) capsule Take by mouth daily. 02/18/20  Yes [provider]  fluticasone (FLONASE) 50 MCG/ACT nasal spray Place 1 spray into both nostrils daily. Patient taking differently: Place 1 spray into both nostrils as needed (allergy season). 05/11/19  Yes Shelva Majestic, MD  loratadine (CLARITIN) 10 MG tablet Take 10 mg by mouth daily as needed.   Yes [provider]  Multiple Vitamins-Minerals (MULTIVITAMIN ADULTS 50+ PO) Take 1 tablet by mouth daily.   Yes [provider]  potassium chloride SA (KLOR-CON M) 20 MEQ tablet Take 1 tablet (20 mEq total) by mouth daily. 06/09/23  Yes Shelva Majestic, MD    Current Outpatient Medications  Medication Sig Dispense Refill    atenolol-chlorthalidone (TENORETIC) 50-25 MG tablet Take 1 tablet by mouth daily. 90 tablet 3   atorvastatin (LIPITOR) 20 MG tablet Take 1 tablet (20 mg total) by mouth daily. 90 tablet 3   Cholecalciferol (VITAMIN D3) 50 MCG (2000 UT) capsule Take by mouth daily.     fluticasone (FLONASE) 50 MCG/ACT nasal spray Place 1 spray into both nostrils daily. (Patient taking differently: Place 1 spray into both nostrils as needed (allergy season).) 48 g 3   loratadine (CLARITIN) 10 MG tablet Take 10 mg by mouth daily as needed.     Multiple Vitamins-Minerals (MULTIVITAMIN ADULTS 50+ PO) Take 1 tablet by mouth daily.     potassium chloride SA (KLOR-CON M) 20 MEQ tablet Take 1 tablet (20 mEq total) by mouth daily. 90 tablet 3   Current Facility-Administered Medications  Medication Dose Route Frequency Provider Last Rate Last Admin   0.9 %  sodium chloride infusion  500 mL Intravenous Once Iva Boop, MD        Allergies as of 07/13/2023 - Review Complete 07/13/2023  Allergen Reaction Noted   Meperidine hcl Anaphylaxis    Morphine  02/18/2021   Percocet [oxycodone-acetaminophen]  05/23/2021   Mupirocin Rash 12/16/2022   Sulfamethoxazole-trimethoprim Rash     Family History  Problem Relation Age of Onset   Uterine cancer Mother    Alzheimer's disease Mother  died january 2021   Diabetes Mother    Arthritis Mother    Cancer Mother    Hypertension Mother    Heart disease Father    Hypertension Father    Cancer Father        b cell lymphoma   CVA Father        passed from stroke 20   AVM Sister        8 years younger. brain. significant in hospital 6 weeks. Ltach/trach feeding tube   Diabetes Brother    Diabetes Brother    Obesity Brother    Breast cancer Maternal Grandmother    Cancer Maternal Grandmother    Colon cancer Neg Hx    Esophageal cancer Neg Hx    Stomach cancer Neg Hx    Rectal cancer Neg Hx     Social History   Socioeconomic History   Marital status:  Married    Spouse name: [name]   Number of children: 3   Years of education: Not on file   Highest education level: Bachelor's degree (e.g., BA, AB, BS)  Occupational History   Occupation: retired  Tobacco Use   Smoking status: Never   Smokeless tobacco: Never  Vaping Use   Vaping status: Never Used  Substance and Sexual Activity   Alcohol use: No   Drug use: No   Sexual activity: Yes    Birth control/protection: Post-menopausal  Other Topics Concern   Not on file  Social History Narrative   Married 47 years in 2020. 3 children. 7 grandchildren. 4 greatgrandchildren.    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, multimedia work 2010, last worked with [employer]   Hobbies: working in garden, camping in RV   Right handed   Caffeine: zero   Social Determinants of Health   Financial Resource Strain: Low Risk  (05/14/2023)   Overall Financial Resource Strain (CARDIA)    Difficulty of Paying Living Expenses: Not hard at all  Food Insecurity: No Food Insecurity (05/14/2023)   Hunger Vital Sign    Worried About Running Out of Food in the Last Year: Never true    Ran Out of Food in the Last Year: Never true  Transportation Needs: No Transportation Needs (05/14/2023)   PRAPARE - Administrator, Civil Service (Medical): No    Lack of Transportation (Non-Medical): No  Physical Activity: Sufficiently Active (05/14/2023)   Exercise Vital Sign    Days of Exercise per Week: 6 days    Minutes of Exercise per Session: 40 min  Stress: No Stress Concern Present (05/14/2023)   [employer] of Occupational Health - Occupational Stress Questionnaire    Feeling of Stress : Not at all  Social Connections: Socially Integrated (05/14/2023)   Social Connection and Isolation Panel [NHANES]    Frequency of Communication with Friends and Family: Three times a week    Frequency of Social Gatherings with Friends and Family: Twice a week    Attends Religious Services: More than 4 times per year    Active  Member of [employer] or Organizations: Yes    Attends Engineer, structural: More than 4 times per year    Marital Status: Married  Catering manager Violence: Not At Risk (05/18/2023)   Humiliation, Afraid, Rape, and Kick questionnaire    Fear of Current or Ex-Partner: No    Emotionally Abused: No    Physically Abused: No    Sexually Abused: No    Review of Systems:  All other review  of systems negative except as mentioned in the HPI.  Physical Exam: Vital signs BP 134/70   Pulse 63   Temp 98.9 F (37.2 C)   Ht 5\' 2"  (1.575 m)   Wt 207 lb (93.9 kg)   LMP  (LMP Unknown)   SpO2 98%   BMI 37.86 kg/m   General:   Alert,  Well-developed, well-nourished, pleasant and cooperative in NAD Lungs:  Clear throughout to auscultation.   Heart:  Regular rate and rhythm; no murmurs, clicks, rubs,  or gallops. Abdomen:  Soft, nontender and nondistended. Normal bowel sounds.   Neuro/Psych:  Alert and cooperative. Normal mood and affect. A and O x 3   @Louretta Tantillo  Sena Slate, MD, [facility] [phone] (pager) 07/13/2023 8:38 AM@

## 2023-07-14 ENCOUNTER — Telehealth: Payer: Self-pay | Admitting: *Deleted

## 2023-07-14 NOTE — Telephone Encounter (Signed)
  Follow up Call-     07/13/2023    8:00 AM  Call back number  Post procedure Call Back phone  # [phone]  Permission to leave phone message Yes     Patient questions:  Do you have a fever, pain , or abdominal swelling? No. Pain Score  0 *  Have you tolerated food without any problems? Yes.    Have you been able to return to your normal activities? Yes.    Do you have any questions about your discharge instructions: Diet   No. Medications  No. Follow up visit  No.  Do you have questions or concerns about your Care? No.  Actions: * If pain score is 4 or above: No action needed, pain <4.

## 2023-07-24 ENCOUNTER — Other Ambulatory Visit: Payer: Self-pay | Admitting: Family Medicine

## 2023-07-24 DIAGNOSIS — Z1231 Encounter for screening mammogram for malignant neoplasm of breast: Secondary | ICD-10-CM

## 2023-08-24 DIAGNOSIS — Z1231 Encounter for screening mammogram for malignant neoplasm of breast: Secondary | ICD-10-CM

## 2023-08-31 ENCOUNTER — Encounter: Payer: Self-pay | Admitting: Family Medicine

## 2023-09-09 ENCOUNTER — Ambulatory Visit
Admission: RE | Admit: 2023-09-09 | Discharge: 2023-09-09 | Disposition: A | Discharge status: Home / Self Care | Payer: Medicare Other | Source: Ambulatory Visit | Attending: Family Medicine | Admitting: Family Medicine

## 2023-09-09 DIAGNOSIS — Z1231 Encounter for screening mammogram for malignant neoplasm of breast: Secondary | ICD-10-CM

## 2023-12-10 ENCOUNTER — Ambulatory Visit: Payer: Medicare Other | Admitting: Family Medicine

## 2023-12-30 ENCOUNTER — Encounter: Payer: Self-pay | Admitting: Family Medicine

## 2023-12-30 ENCOUNTER — Ambulatory Visit (INDEPENDENT_AMBULATORY_CARE_PROVIDER_SITE_OTHER): Payer: Medicare Other | Admitting: Family Medicine

## 2023-12-30 VITALS — BP 136/78 | HR 87 | Temp 97.2°F | Ht 62.0 in | Wt 207.0 lb

## 2023-12-30 DIAGNOSIS — I10 Essential (primary) hypertension: Secondary | ICD-10-CM | POA: Diagnosis not present

## 2023-12-30 DIAGNOSIS — G35 Multiple sclerosis: Secondary | ICD-10-CM | POA: Diagnosis not present

## 2023-12-30 DIAGNOSIS — R739 Hyperglycemia, unspecified: Secondary | ICD-10-CM

## 2023-12-30 DIAGNOSIS — K50919 Crohn's disease, unspecified, with unspecified complications: Secondary | ICD-10-CM

## 2023-12-30 DIAGNOSIS — Z131 Encounter for screening for diabetes mellitus: Secondary | ICD-10-CM

## 2023-12-30 DIAGNOSIS — E785 Hyperlipidemia, unspecified: Secondary | ICD-10-CM | POA: Diagnosis not present

## 2023-12-30 LAB — CBC WITH DIFFERENTIAL/PLATELET
Basophils Absolute: 0 10*3/uL (ref 0.0–0.1)
Basophils Relative: 0.5 % (ref 0.0–3.0)
Eosinophils Absolute: 0.1 10*3/uL (ref 0.0–0.7)
Eosinophils Relative: 1.1 % (ref 0.0–5.0)
HCT: 40.6 % (ref 36.0–46.0)
Hemoglobin: 13.9 g/dL (ref 12.0–15.0)
Lymphocytes Relative: 10.2 % — ABNORMAL LOW (ref 12.0–46.0)
Lymphs Abs: 0.5 10*3/uL — ABNORMAL LOW (ref 0.7–4.0)
MCHC: 34.2 g/dL (ref 30.0–36.0)
MCV: 90 fl (ref 78.0–100.0)
Monocytes Absolute: 0.5 10*3/uL (ref 0.1–1.0)
Monocytes Relative: 10.2 % (ref 3.0–12.0)
Neutro Abs: 4 10*3/uL (ref 1.4–7.7)
Neutrophils Relative %: 78 % — ABNORMAL HIGH (ref 43.0–77.0)
Platelets: 206 10*3/uL (ref 150.0–400.0)
RBC: 4.51 Mil/uL (ref 3.87–5.11)
RDW: 13.4 % (ref 11.5–15.5)
WBC: 5.2 10*3/uL (ref 4.0–10.5)

## 2023-12-30 LAB — COMPREHENSIVE METABOLIC PANEL
ALT: 18 U/L (ref 0–35)
AST: 19 U/L (ref 0–37)
Albumin: 4.6 g/dL (ref 3.5–5.2)
Alkaline Phosphatase: 84 U/L (ref 39–117)
BUN: 18 mg/dL (ref 6–23)
CO2: 32 meq/L (ref 19–32)
Calcium: 9.9 mg/dL (ref 8.4–10.5)
Chloride: 97 meq/L (ref 96–112)
Creatinine, Ser: 0.77 mg/dL (ref 0.40–1.20)
GFR: 78.47 mL/min (ref >60.00)
Glucose, Bld: 96 mg/dL (ref 70–99)
Potassium: 3.8 meq/L (ref 3.5–5.1)
Sodium: 138 meq/L (ref 135–145)
Total Bilirubin: 0.9 mg/dL (ref 0.2–1.2)
Total Protein: 7.9 g/dL (ref 6.0–8.3)

## 2023-12-30 LAB — HEMOGLOBIN A1C: Hgb A1c MFr Bld: 5.6 % (ref 4.6–6.5)

## 2023-12-30 NOTE — Progress Notes (Signed)
 Phone [phone] In person visit   Subjective:   Savannah Morrow is a 68 y.o. year old very pleasant female patient who presents for/with See problem oriented charting Chief Complaint  Patient presents with   Medical Management of Chronic Issues   Hypertension    Past Medical History-  Patient Active Problem List   Diagnosis Date Noted   Multiple sclerosis (HCC) 12/23/2021    Priority: High   Morbid obesity (HCC) 03/07/2011    Priority: High   Vitamin D deficiency 12/23/2021    Priority: Medium    Osteopenia 05/21/2020    Priority: Medium    Hx of adenomatous colonic polyps 03/17/2018    Priority: Medium    Family history of cerebrovascular accident (CVA) in sister 10/06/2017    Priority: Medium    Hyperglycemia 04/07/2016    Priority: Medium    Crohn's disease (HCC)     Priority: Medium    Hyperlipidemia 06/02/2007    Priority: Medium    Essential hypertension 06/02/2007    Priority: Medium    S/P cervical spinal fusion 12/23/2021    Priority: Low   Hearing loss 11/11/2019    Priority: Low   S/P total hysterectomy 04/14/2018    Priority: Low   Chest pain 03/30/2014    Priority: Low   Osteoarthritis of both knees 01/10/2010    Priority: Low   Edema 01/10/2010    Priority: Low   Allergic rhinitis 06/02/2007    Priority: Low   Difficult intravenous access 07/13/2023   Numbness 03/26/2022   Gait disturbance 03/26/2022   High risk medication use 12/23/2021   Occipital neuralgia of left side 12/23/2021   Spinal stenosis in cervical region 12/18/2020    Medications- reviewed and updated Current Outpatient Medications  Medication Sig Dispense Refill   atenolol-chlorthalidone (TENORETIC) 50-25 MG tablet Take 1 tablet by mouth daily. 90 tablet 3   atorvastatin (LIPITOR) 20 MG tablet Take 1 tablet (20 mg total) by mouth daily. 90 tablet 3   Cholecalciferol (VITAMIN D3) 50 MCG (2000 UT) capsule Take by mouth daily.     fluticasone (FLONASE) 50 MCG/ACT nasal spray  Place 1 spray into both nostrils daily. (Patient taking differently: Place 1 spray into both nostrils as needed (allergy season).) 48 g 3   loratadine (CLARITIN) 10 MG tablet Take 10 mg by mouth daily as needed.     Multiple Vitamins-Minerals (MULTIVITAMIN ADULTS 50+ PO) Take 1 tablet by mouth daily.     potassium chloride SA (KLOR-CON M) 20 MEQ tablet Take 1 tablet (20 mEq total) by mouth daily. 90 tablet 3   No current facility-administered medications for this visit.     Objective:  BP 136/78   Pulse 87   Temp (!) 97.2 F (36.2 C)   Ht 5\' 2"  (1.575 m)   Wt 207 lb (93.9 kg)   LMP  (LMP Unknown)   SpO2 96%   BMI 37.86 kg/m  Gen: NAD, resting comfortably CV: RRR no murmurs rubs or gallops Lungs: CTAB no crackles, wheeze, rhonchi Abdomen: soft/nontender/nondistended/normal bowel sounds. No rebound or guarding.  Ext: trace edema- not in compression today Skin: warm, dry     Assessment and Plan   #social update- daughter with breast cancer at 64 -on keytruda right now  #COVID in August- did well with Paxlovid- reports bad tasting- also gave metallic taste- got from her husband.   #Multiple sclerosis-originally diagnosed after MRI every 20 2023 of cervical spine with MRI of the brain with foci  consistent with MS.  Following closely with Dr. Epimenio Foot.  On Mavenclad x 2 years- completed April 2024 and monitoring- upcoming visit on Monday - some right upper quadrant of abdomen pain on mavenclad - that has recurred afterwards - using cane but not four pronged anymore  #hypertension S: medication: atenolol-chlorthalidone 50-25Mg . Also continue potassium Home readings #s: 130s for most part over high 70s to 80 BP Readings from Last 3 Encounters:  12/30/23 136/78  07/13/23 129/75  06/25/23 (!) 144/72  A/P: well controlled continue current medications   #hyperlipidemia S: Medication: Atorvastatin 20Mg  -doing mediterranean diet- feels low on protein though- tries to get  lentils -getting around 5000 steps per day Lab Results  Component Value Date   CHOL 138 06/09/2023   HDL 43.00 06/09/2023   LDLCALC 74 06/09/2023   LDLDIRECT 63.0 11/09/2018   TRIG 104.0 06/09/2023   CHOLHDL 3 06/09/2023   A/P:  LDL just hair above goal- continue current medications and continue to work on healthy eating and regular exercise   # Hyperglycemia/insulin resistance/prediabetes- peak a1c of 5.8.  #morbid obesity-peak weight 237- goal of 195  S:  Medication: none  A/P: hopefully stable- update a1c today. Continue without meds for now   #crohn's disease-  S:  Colon resection in 20s. Dr. Leone Payor is not certain this is acurate diagnosis. No evidence on repeat colonoscopy. No blood in stool or abdominal cramping recently A/P: has done well- including updated colonoscopy and was released! Doing so well- stay off as long as no blood in stool or abdominal cramping  #Osteopenia/low bone density-DEXA 06/04/2022 with worst T score -1.5 at right femoral neck but not in range for prescription medication- update next visit   #morbid obesity with hypertension and hyperlipidemia and BMI over 35- continue to work on healthy eating and regular exercise- shed like to get under 35  Recommended follow up: Return in about 6 months (around 07/01/2024) for physical or sooner if needed.Schedule b4 you leave. Future Appointments  Date Time Provider Department Center  01/04/2024 11:30 AM Sater, Pearletha Furl, MD [facility] None  05/23/2024  9:30 AM [facility] ANNUAL WELLNESS VISIT 1 [facility]   Lab/Order associations: fasting   ICD-10-CM   1. Essential hypertension  I10 Comprehensive metabolic panel    CBC with Differential/Platelet    2. Hyperlipidemia, unspecified hyperlipidemia type  E78.5 Comprehensive metabolic panel    CBC with Differential/Platelet    3. Hyperglycemia  R73.9 Hemoglobin A1c    4. Screening for diabetes mellitus  Z13.1 Hemoglobin A1c    5. Multiple sclerosis (HCC) Chronic G35      6. Crohn's disease with complication, unspecified gastrointestinal tract location [facility]) Chronic K50.919     7. Morbid obesity (HCC) Chronic E66.01       No orders of the defined types were placed in this encounter.   Return precautions advised.  Tana Conch, MD

## 2023-12-30 NOTE — Patient Instructions (Addendum)
 Please stop by lab before you go If you have mychart- we will send your results within 3 business days of [place] receiving them.  If you do not have mychart- we will call you about results within 5 business days of [place] receiving them.  *please also note that you will see labs on mychart as soon as they post. I will later go in and write notes on them- will say "notes from Dr. Durene Cal"   No changes today unless labs lead [place] to make changes  Recommended follow up: Return in about 6 months (around 07/01/2024) for physical or sooner if needed.Schedule b4 you leave.

## 2024-01-04 ENCOUNTER — Ambulatory Visit: Payer: Medicare Other | Admitting: Neurology

## 2024-01-04 ENCOUNTER — Encounter: Payer: Self-pay | Admitting: Neurology

## 2024-01-04 VITALS — BP 146/80 | HR 80 | Ht 63.0 in | Wt 217.5 lb

## 2024-01-04 DIAGNOSIS — G35 Multiple sclerosis: Secondary | ICD-10-CM | POA: Diagnosis not present

## 2024-01-04 DIAGNOSIS — R519 Headache, unspecified: Secondary | ICD-10-CM | POA: Diagnosis not present

## 2024-01-04 DIAGNOSIS — R200 Anesthesia of skin: Secondary | ICD-10-CM | POA: Diagnosis not present

## 2024-01-04 DIAGNOSIS — Z79899 Other long term (current) drug therapy: Secondary | ICD-10-CM | POA: Diagnosis not present

## 2024-01-04 DIAGNOSIS — R269 Unspecified abnormalities of gait and mobility: Secondary | ICD-10-CM | POA: Diagnosis not present

## 2024-01-04 DIAGNOSIS — E559 Vitamin D deficiency, unspecified: Secondary | ICD-10-CM | POA: Diagnosis not present

## 2024-01-04 NOTE — Progress Notes (Signed)
 [facility]  PATIENT: Savannah Morrow DOB: [birth year]  REFERRING DOCTOR OR PCP: Coletta Memos MD (neurosurgery); Tana Conch, MD (PCP) SOURCE: Patient, notes from Dr. Franky Macho, imaging and lab reports, MRI images personally reviewed.  _________________________________   HISTORICAL  CHIEF COMPLAINT:  Chief Complaint  Patient presents with   Follow-up    Rm10, alone, Multiple sclerosis:doing well. Numbness:denied numbness in left face. Gait disturbance:uses walker to help with ambulation. No concerns to report doing well on mavenclad. Needs bp recheck prior to departure    HISTORY OF PRESENT ILLNESS:  Savannah Morrow is a 68 y.o. woman with multiple sclerosis.  Update 01/04/2024 She did her 1st year of Mavenclad late March and late April 2023 and second year March and April 2024 (last pill 02/07/2023). She had stomach pain and nasal blisters with the second year but this recovered over 2 weeks.  No infections or other issues.   CBC/D showed a lymphocyte count of 0.5 last week (12/30/23)  She is using a standard cane and not needing the quad cane.   She feels balance is slightly better ompared to last year.   She can walk some without it.  She uses a bannister on stairs.   She tries to walk a mile a day as exercise and with regular walking often does > 5000 steps a day if no rain.    She notes left facial and bilateral leg pins/needles senation but less than last year.     Vision is stable.   No color desaturation.    She has stable  mild stress incontinence (cough/laugh) for many years.  Most nights has nocturia x 1.  She wears pads.     She wears a left hearing aid     She has fatigue the past few months.    This is worse with heat.   She sleeps well most nights.   She denies any cognitive issues.   She notes no depression or anxiety.      She takes Vit D supplement  MS History She had lower cervical spina and right muscle spasms and pain and numbness in the right arm  in C6 dermatome.   She also noted right  hip pain and sometimes the leg would almost give out.   She had surgery May 2022.    She developed Covid-19 during 08/2021.  She felt weaker afterwards and had left neck pain.   She noted an electric shock sensation going down the left neck.     She felt better using an ice pack.     In January she began to have reduced balance and she had pain in the left jaw and left facial numbness.   She improved some but still had pain and had a follow-up MRI 12/09/2021.    It showed the ACDF and a focus c/w MS.    MRI of the brain also showed foci c/w MS.     She is referred for further evaluation and treatment  Several of her mother's cousins have MS.  Her sister had an AVM causing a cerebral hemorrhage.      She is fairly healthy with HTN and borderline DM.   She is trying to lose weight.     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  She had multiple CXR afterwards - all fine.   Last one 2010  Imaging review  MRI of the cervical spine 12/09/2021 shows a T2 hyperintense focus  laterally to the right and the spinal cord adjacent to C7 and T1.   Additionally, there is a left posterolateral focus adjacent to C1-C2 that also appeared to be present on the 2022 MRI.   On the 2023 MRI, she is status post C5-C7 ACDF.  This study was compared to the MRI from 12/15/2020.  At that time, she had significant degenerative changes at C5-C6 and C6-C7 and also had spinal stenosis and foraminal narrowing at those levels.  The 2 probable demyelinating foci are present on this MRI.  MRI of the brain 12/09/2021 showed scattered T2/FLAIR hyperintense foci in the periventricular, juxtacortical and deep white matter of the hemispheres in a pattern compatible with multiple sclerosis.  There may be 1 small hyperintensity in the left basis pontis.  This study was done without contrast.    REVIEW OF SYSTEMS: Constitutional: No fevers, chills, sweats, or change in appetite Eyes: No visual  changes, double vision, eye pain Ear, nose and throat: No hearing loss, ear pain, nasal congestion, sore throat Cardiovascular: No chest pain, palpitations Respiratory:  No shortness of breath at rest or with exertion.   No wheezes GastrointestinaI: No nausea, vomiting, diarrhea, abdominal pain, fecal incontinence Genitourinary:  No dysuria, urinary retention or frequency.  No nocturia. Musculoskeletal:  No neck pain, back pain Integumentary: No rash, pruritus, skin lesions Neurological: as above Psychiatric: No depression at this time.  No anxiety Endocrine: No palpitations, diaphoresis, change in appetite, change in weigh or increased thirst Hematologic/Lymphatic:  No anemia, purpura, petechiae. Allergic/Immunologic: No itchy/runny eyes, nasal congestion, recent allergic reactions, rashes  ALLERGIES: Allergies  Allergen Reactions   Meperidine Hcl Anaphylaxis   Morphine    Percocet [Oxycodone-Acetaminophen]     rash   Mupirocin Rash   Sulfamethoxazole-Trimethoprim Rash    HOME MEDICATIONS:  Current Outpatient Medications:    atenolol-chlorthalidone (TENORETIC) 50-25 MG tablet, Take 1 tablet by mouth daily., Disp: 90 tablet, Rfl: 3   atorvastatin (LIPITOR) 20 MG tablet, Take 1 tablet (20 mg total) by mouth daily., Disp: 90 tablet, Rfl: 3   Cholecalciferol (VITAMIN D3) 50 MCG (2000 UT) capsule, Take by mouth daily., Disp: , Rfl:    fluticasone (FLONASE) 50 MCG/ACT nasal spray, Place 1 spray into both nostrils daily. (Patient taking differently: Place 1 spray into both nostrils as needed (allergy season).), Disp: 48 g, Rfl: 3   loratadine (CLARITIN) 10 MG tablet, Take 10 mg by mouth daily as needed., Disp: , Rfl:    Multiple Vitamins-Minerals (MULTIVITAMIN ADULTS 50+ PO), Take 1 tablet by mouth daily., Disp: , Rfl:    potassium chloride SA (KLOR-CON M) 20 MEQ tablet, Take 1 tablet (20 mEq total) by mouth daily., Disp: 90 tablet, Rfl: 3  PAST MEDICAL HISTORY: Past Medical History:   Diagnosis Date   Allergy    Arthritis    knees   Crohn's disease (HCC)    colon resection in 20. Dr. Leone Payor unsure of diagnosis- no evidence on colonoscopy   Difficult intravenous access 07/13/2023   Heart murmur    Hx of adenomatous colonic polyps 03/17/2018   Hyperlipidemia    Hypertension    Multiple sclerosis (HCC)    Neuromuscular disorder (HCC) 11/2021   Diagnosed with Late Onset Multiple Sclerosis   Uterine cancer (HCC) 1989   s/p hysterectomy    PAST SURGICAL HISTORY: Past Surgical History:  Procedure Laterality Date   ABDOMINAL HYSTERECTOMY  1989   Total abdominal, uterine cancer   APPENDECTOMY     BREAST BIOPSY Left 03/2019  fatty tissue   COLON SURGERY  1978-79   COLONOSCOPY     EYE SURGERY  July & Aug 2020   Bilateral Cataracts removed   OOPHORECTOMY     x1   SPINE SURGERY  03/01/2021   Anterior Cervical Discectomy C5,6,7 with Cadeverous Bone Fusion   total hysterectomy     WISDOM TOOTH EXTRACTION      FAMILY HISTORY: Family History  Problem Relation Age of Onset   Uterine cancer Mother    Alzheimer's disease Mother        died 04-Dec-2019  Diabetes Mother    Arthritis Mother    Cancer Mother    Hypertension Mother    Heart disease Father    Hypertension Father    Cancer Father        b cell lymphoma   CVA Father        passed from stroke 69   AVM Sister        8 years younger. brain. significant in hospital 6 weeks. Ltach/trach feeding tube   Breast cancer Daughter 78   Breast cancer Maternal Grandmother    Cancer Maternal Grandmother    Diabetes Brother    Diabetes Brother    Obesity Brother    Colon cancer Neg Hx    Esophageal cancer Neg Hx    Stomach cancer Neg Hx    Rectal cancer Neg Hx     SOCIAL HISTORY:  Social History   Socioeconomic History   Marital status: Married    Spouse name: [name]   Number of children: 3   Years of education: Not on file   Highest education level: Bachelor's degree (e.g., BA, AB, BS)   Occupational History   Occupation: retired  Tobacco Use   Smoking status: Never   Smokeless tobacco: Never  Vaping Use   Vaping status: Never Used  Substance and Sexual Activity   Alcohol use: No   Drug use: No   Sexual activity: Yes    Birth control/protection: Post-menopausal  Other Topics Concern   Not on file  Social History Narrative   Married 47 years in 2020. 3 children. 7 grandchildren. 4 greatgrandchildren.    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, multimedia work 2010, last worked with [employer]   Hobbies: working in garden, camping in RV   Right handed   Caffeine: zero   Social Drivers of Corporate investment banker Strain: Low Risk  (12/26/2023)   Overall Financial Resource Strain (CARDIA)    Difficulty of Paying Living Expenses: Not hard at all  Food Insecurity: No Food Insecurity (12/26/2023)   Hunger Vital Sign    Worried About Running Out of Food in the Last Year: Never true    Ran Out of Food in the Last Year: Never true  Transportation Needs: No Transportation Needs (12/26/2023)   PRAPARE - Administrator, Civil Service (Medical): No    Lack of Transportation (Non-Medical): No  Physical Activity: Sufficiently Active (12/26/2023)   Exercise Vital Sign    Days of Exercise per Week: 6 days    Minutes of Exercise per Session: 60 min  Stress: No Stress Concern Present (12/26/2023)   [employer] of Occupational Health - Occupational Stress Questionnaire    Feeling of Stress : Not at all  Social Connections: Socially Integrated (12/26/2023)   Social Connection and Isolation Panel [NHANES]    Frequency of Communication with Friends and Family: More than three times a week    Frequency  of Social Gatherings with Friends and Family: More than three times a week    Attends Religious Services: More than 4 times per year    Active Member of [employer] or Organizations: Yes    Attends Engineer, structural: More than 4 times per year    Marital Status: Married  Careers information officer Violence: Not At Risk (05/18/2023)   Humiliation, Afraid, Rape, and Kick questionnaire    Fear of Current or Ex-Partner: No    Emotionally Abused: No    Physically Abused: No    Sexually Abused: No     PHYSICAL EXAM  Vitals:   01/04/24 1131  BP: (!) 146/80  Pulse: 80  Weight: 217 lb 8 oz (98.7 kg)  Height: 5\' 3"  (1.6 m)    Body mass index is 38.53 kg/m.   General: The patient is well-developed and well-nourished and in no acute distress  HEENT:  Head is [place]/AT.  Sclera are anicteric.   Skin: Extremities are without rash or  edema.  Musculoskeletal:  Back and neck are nontender  Neurologic Exam  Mental status: The patient is alert and oriented x 3 at the time of the examination. The patient has apparent normal recent and remote memory, with an apparently normal attention span and concentration ability.   Speech is normal.  Cranial nerves: Extraocular movements are full. Pupils are equal, round, and reactive to light and accomodation.  Facial strength and sensation are fine . No obvious hearing deficits are noted.  Motor:  Muscle bulk is normal.   Tone is normal. Strength is  5 / 5 in all 4 extremities except 4+/5 riht iliopsoas and toes.   Sensory: Sensory testing is intact to pinprick, soft touch and vibration sensation in all 4 extremities.  Coordination: Cerebellar testing reveals good finger-nose-finger and reduced right heel-to-shin bilaterally.  Gait and station: Station is normal.   Gait is mildly wide but she walks well without the cane ankle turned in 3 steps..  Tandem gait is  mildly wide .    Romberg is negative.   Reflexes: Deep tendon reflexes are symmetric and normal bilaterally in arms.       DIAGNOSTIC DATA (LABS, IMAGING, TESTING) - I reviewed patient records, labs, notes, testing and imaging myself where available.  Lab Results  Component Value Date   WBC 5.2 12/30/2023   HGB 13.9 12/30/2023   HCT 40.6 12/30/2023   MCV 90.0 12/30/2023    PLT 206.0 12/30/2023      Component Value Date/Time   NA 138 12/30/2023 1002   NA 140 03/11/2023 1420   K 3.8 12/30/2023 1002   CL 97 12/30/2023 1002   CO2 32 12/30/2023 1002   GLUCOSE 96 12/30/2023 1002   GLUCOSE 101 (H) 10/22/2006 1217   BUN 18 12/30/2023 1002   BUN 14 03/11/2023 1420   CREATININE 0.77 12/30/2023 1002   CREATININE 0.79 07/31/2020 1031   CALCIUM 9.9 12/30/2023 1002   PROT 7.9 12/30/2023 1002   PROT 6.5 03/11/2023 1420   ALBUMIN 4.6 12/30/2023 1002   ALBUMIN 4.2 03/11/2023 1420   AST 19 12/30/2023 1002   ALT 18 12/30/2023 1002   ALKPHOS 84 12/30/2023 1002   BILITOT 0.9 12/30/2023 1002   BILITOT 0.7 03/11/2023 1420   GFRNONAA 78.90 01/04/2010 0813   GFRAA 84 10/26/2008 0949   Lab Results  Component Value Date   CHOL 138 06/09/2023   HDL 43.00 06/09/2023   LDLCALC 74 06/09/2023   LDLDIRECT 63.0 11/09/2018  TRIG 104.0 06/09/2023   CHOLHDL 3 06/09/2023   Lab Results  Component Value Date   HGBA1C 5.6 12/30/2023   No results found for: "VITAMINB12" Lab Results  Component Value Date   TSH 2.26 04/07/2016       ASSESSMENT AND PLAN  Multiple sclerosis (HCC)  High risk medication use  Gait disturbance  Occipital headache  Vitamin D deficiency  Numbness The second year of Mavenclad in April 2024.  Labs were ok - 0.5 lymphocytes (better than 6 months ago at 0.3).  We discussed some people can take > 12 months to get back to LLN.   Stay active and exercise.  She eats well.       Continue Vit D suppl.  Rtc 6 months, will recheck labs then  This visit is part of a comprehensive longitudinal care medical relationship regarding the patients primary diagnosis of MS and related concerns.   Negar Sieler A. Epimenio Foot, MD, [facility] 01/04/2024, 11:47 AM Certified in Neurology, Clinical Neurophysiology, Sleep Medicine and Neuroimaging  [facility] [address], [address] [place], [place] [address] [phone]

## 2024-01-26 ENCOUNTER — Other Ambulatory Visit (HOSPITAL_COMMUNITY): Payer: Self-pay

## 2024-05-05 ENCOUNTER — Encounter: Payer: Self-pay | Admitting: Family Medicine

## 2024-05-05 ENCOUNTER — Other Ambulatory Visit: Payer: Self-pay | Admitting: Family Medicine

## 2024-05-06 ENCOUNTER — Other Ambulatory Visit (HOSPITAL_COMMUNITY): Payer: Self-pay

## 2024-05-06 MED ORDER — ATORVASTATIN CALCIUM 20 MG PO TABS
20.0000 mg | ORAL_TABLET | Freq: Every day | ORAL | 3 refills | Status: AC
  Filled 2024-05-06: qty 90, 90d supply, fill #0
  Filled 2024-08-07: qty 90, 90d supply, fill #1
  Filled 2024-11-01: qty 90, 90d supply, fill #2

## 2024-05-06 MED ORDER — ATENOLOL-CHLORTHALIDONE 50-25 MG PO TABS
1.0000 | ORAL_TABLET | Freq: Every day | ORAL | 3 refills | Status: AC
  Filled 2024-05-06: qty 90, 90d supply, fill #0
  Filled 2024-08-07: qty 90, 90d supply, fill #1
  Filled 2024-11-01: qty 90, 90d supply, fill #2

## 2024-05-23 ENCOUNTER — Ambulatory Visit (INDEPENDENT_AMBULATORY_CARE_PROVIDER_SITE_OTHER): Payer: Medicare Other

## 2024-05-23 VITALS — Ht 62.0 in | Wt 211.0 lb

## 2024-05-23 DIAGNOSIS — Z0000 Encounter for general adult medical examination without abnormal findings: Secondary | ICD-10-CM | POA: Diagnosis not present

## 2024-05-23 NOTE — Patient Instructions (Signed)
 Savannah Morrow , Thank you for taking time out of your busy schedule to complete your Annual Wellness Visit with me. I enjoyed our conversation and look forward to speaking with you again next year. I, as well as your care team,  appreciate your ongoing commitment to your health goals. Please review the following plan we discussed and let me know if I can assist you in the future. Your Game plan/ To Do List    Referrals: If you haven't heard from the office you've been referred to, please reach out to them at the phone provided.   Follow up Visits: We will see or speak with you next year for your Next Medicare AWV with our clinical staff Have you seen your provider in the last 6 months (3 months if uncontrolled diabetes)? Yes  Clinician Recommendations:  Aim for 30 minutes of exercise or brisk walking, 6-8 glasses of water, and 5 servings of fruits and vegetables each day.       This is a list of the screenings recommended for you:  Health Maintenance  Topic Date Due   COVID-19 Vaccine (5 - Moderna risk 2024-25 season) 02/28/2024   Medicare Annual Wellness Visit  05/17/2024   Flu Shot  05/20/2024   Mammogram  09/08/2025   DTaP/Tdap/Td vaccine (3 - Td or Tdap) 04/07/2026   Pneumococcal Vaccine for age over 59  Completed   DEXA scan (bone density measurement)  Completed   Hepatitis C Screening  Completed   Zoster (Shingles) Vaccine  Completed   Hepatitis B Vaccine  Aged Out   HPV Vaccine  Aged Out   Meningitis B Vaccine  Aged Out   Colon Cancer Screening  Discontinued    Advanced directives: (In Chart) A copy of your advanced directives are scanned into your chart should your provider ever need it. Advance Care Planning is important because it:  [x]  Makes sure you receive the medical care that is consistent with your values, goals, and preferences  [x]  It provides guidance to your family and loved ones and reduces their decisional burden about whether or not they are making the right  decisions based on your wishes.  Follow the link provided in your after visit summary or read over the paperwork we have mailed to you to help you started getting your Advance Directives in place. If you need assistance in completing these, please reach out to us  so that we can help you!  See attachments for Preventive Care and Fall Prevention Tips.

## 2024-05-23 NOTE — Progress Notes (Signed)
 Subjective:   Savannah Morrow is a 68 y.o. who presents for a Medicare Wellness preventive visit.  As a reminder, Annual Wellness Visits don't include a physical exam, and some assessments may be limited, especially if this visit is performed virtually. We may recommend an in-person follow-up visit with your provider if needed.  Visit Complete: Virtual I connected with  Savannah Morrow on 05/23/24 by a audio enabled telemedicine application and verified that I am speaking with the correct person using two identifiers.  Patient Location: Home  Provider Location: Office/Clinic  I discussed the limitations of evaluation and management by telemedicine. The patient expressed understanding and agreed to proceed.  Vital Signs: Because this visit was a virtual/telehealth visit, some criteria may be missing or patient reported. Any vitals not documented were not able to be obtained and vitals that have been documented are patient reported.  VideoDeclined- This patient declined Librarian, academic. Therefore the visit was completed with audio only.  Persons Participating in Visit: Patient.  AWV Questionnaire: Yes: Patient Medicare AWV questionnaire was completed by the patient on 05/22/24; I have confirmed that all information answered by patient is correct and no changes since this date.  Cardiac Risk Factors include: advanced age (>39men, >74 women);dyslipidemia;diabetes mellitus;obesity (BMI >30kg/m2);female gender     Objective:    Today's Vitals   05/23/24 0923  Weight: 211 lb (95.7 kg)  Height: 5' 2 (1.575 m)   Body mass index is 38.59 kg/m.     05/23/2024    9:29 AM 05/18/2023   10:07 AM 06/02/2022    1:59 PM 01/03/2022    9:15 PM 01/03/2022    8:21 PM 05/23/2021    1:53 PM 01/28/2021   11:48 AM  Advanced Directives  Does Patient Have a Medical Advance Directive? Yes Yes Yes No No Yes No  Type of Estate agent of [place];Living will  Healthcare Power of [place];Living will Living will;Healthcare Power of Attorney   Living will;Healthcare Power of Attorney   Does patient want to make changes to medical advance directive? No - Patient declined        Copy of Healthcare Power of Attorney in Chart? Yes - validated most recent copy scanned in chart (See row information) No - copy requested No - copy requested   Yes - validated most recent copy scanned in chart (See row information)   Would patient like information on creating a medical advance directive?       No - Patient declined    Current Medications (verified) Outpatient Encounter Medications as of 05/23/2024  Medication Sig   atenolol -chlorthalidone  (TENORETIC ) 50-25 MG tablet Take 1 tablet by mouth daily.   atorvastatin  (LIPITOR) 20 MG tablet Take 1 tablet (20 mg total) by mouth daily.   Cholecalciferol (VITAMIN D3) 50 MCG (2000 UT) capsule Take by mouth daily.   fluticasone  (FLONASE ) 50 MCG/ACT nasal spray Place 1 spray into both nostrils daily.   loratadine (CLARITIN) 10 MG tablet Take 10 mg by mouth daily as needed.   Multiple Vitamins-Minerals (MULTIVITAMIN ADULTS 50+ PO) Take 1 tablet by mouth daily.   potassium chloride  SA (KLOR-CON  M) 20 MEQ tablet Take 1 tablet (20 mEq total) by mouth daily.   No facility-administered encounter medications on file as of 05/23/2024.    Allergies (verified) Meperidine hcl, Morphine, Percocet [oxycodone -acetaminophen ], Mupirocin , and Sulfamethoxazole-trimethoprim   History: Past Medical History:  Diagnosis Date   Allergy    Arthritis    knees  Crohn's disease (HCC)    colon resection in 20. Dr. Avram unsure of diagnosis- no evidence on colonoscopy   Difficult intravenous access 07/13/2023   Heart murmur    Hx of adenomatous colonic polyps 03/17/2018   Hyperlipidemia    Hypertension    Multiple sclerosis (HCC)    Neuromuscular disorder (HCC) 11/2021   Diagnosed with Late Onset Multiple Sclerosis   Uterine cancer (HCC)  1989   s/p hysterectomy   Past Surgical History:  Procedure Laterality Date   ABDOMINAL HYSTERECTOMY  1989   Total abdominal, uterine cancer   APPENDECTOMY     BREAST BIOPSY Left 03/2019   fatty tissue   COLON SURGERY  1978-79   COLONOSCOPY     EYE SURGERY  July & Aug 2020   Bilateral Cataracts removed   OOPHORECTOMY     x1   SPINE SURGERY  03/01/2021   Anterior Cervical Discectomy C5,6,7 with Cadeverous Bone Fusion   total hysterectomy     WISDOM TOOTH EXTRACTION     Family History  Problem Relation Age of Onset   Uterine cancer Mother    Alzheimer's disease Mother        died 12/05/19  Diabetes Mother    Arthritis Mother    Cancer Mother    Hypertension Mother    Heart disease Father    Hypertension Father    Cancer Father        b cell lymphoma   CVA Father        passed from stroke 66   AVM Sister        8 years younger. brain. significant in hospital 6 weeks. Ltach/trach feeding tube   Breast cancer Daughter 75   Breast cancer Maternal Grandmother    Cancer Maternal Grandmother    Diabetes Brother    Diabetes Brother    Obesity Brother    Colon cancer Neg Hx    Esophageal cancer Neg Hx    Stomach cancer Neg Hx    Rectal cancer Neg Hx    Social History   Socioeconomic History   Marital status: Married    Spouse name: Tanda   Number of children: 3   Years of education: Not on file   Highest education level: Bachelor's degree (e.g., BA, AB, BS)  Occupational History   Occupation: retired  Tobacco Use   Smoking status: Never   Smokeless tobacco: Never  Vaping Use   Vaping status: Never Used  Substance and Sexual Activity   Alcohol use: No   Drug use: No   Sexual activity: Yes    Birth control/protection: Post-menopausal  Other Topics Concern   Not on file  Social History Narrative   Married 47 years in 2020. 3 children. 7 grandchildren. 4 greatgrandchildren.    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, multimedia work 2010, last worked with [employer]    Hobbies: working in garden, camping in RV   Right handed   Caffeine: zero   Social Drivers of Corporate investment banker Strain: Low Risk  (05/22/2024)   Overall Financial Resource Strain (CARDIA)    Difficulty of Paying Living Expenses: Not hard at all  Food Insecurity: No Food Insecurity (05/22/2024)   Hunger Vital Sign    Worried About Running Out of Food in the Last Year: Never true    Ran Out of Food in the Last Year: Never true  Transportation Needs: No Transportation Needs (05/22/2024)   PRAPARE - Transportation    Lack of  Transportation (Medical): No    Lack of Transportation (Non-Medical): No  Physical Activity: Sufficiently Active (05/22/2024)   Exercise Vital Sign    Days of Exercise per Week: 5 days    Minutes of Exercise per Session: 30 min  Stress: No Stress Concern Present (05/22/2024)   [employer] of Occupational Health - Occupational Stress Questionnaire    Feeling of Stress: Not at all  Social Connections: Socially Integrated (05/22/2024)   Social Connection and Isolation Panel    Frequency of Communication with Friends and Family: More than three times a week    Frequency of Social Gatherings with Friends and Family: More than three times a week    Attends Religious Services: More than 4 times per year    Active Member of [employer] or Organizations: Yes    Attends Engineer, structural: More than 4 times per year    Marital Status: Married    Tobacco Counseling Counseling given: Not Answered    Clinical Intake:  Pre-visit preparation completed: Yes  Pain : No/denies pain     BMI - recorded: 38.59 Nutritional Status: BMI > 30  Obese Nutritional Risks: None Diabetes: Yes CBG done?: No Did pt. bring in CBG monitor from home?: No  Lab Results  Component Value Date   HGBA1C 5.6 12/30/2023   HGBA1C 5.5 06/09/2023   HGBA1C 5.4 12/09/2022     How often do you need to have someone help you when you read instructions, pamphlets, or other written  materials from your doctor or pharmacy?: 1 - Never  Interpreter Needed?: No  Information entered by :: Savannah Haws, LPN   Activities of Daily Living     05/22/2024    8:21 PM  In your present state of health, do you have any difficulty performing the following activities:  Hearing? 1  Comment hearing aid left ear  Vision? 0  Difficulty concentrating or making decisions? 0  Walking or climbing stairs? 1  Dressing or bathing? 0  Doing errands, shopping? 1  Preparing Food and eating ? N  Using the Toilet? N  In the past six months, have you accidently leaked urine? Y  Do you have problems with loss of bowel control? N  Managing your Medications? N  Managing your Finances? N  Housekeeping or managing your Housekeeping? N    Patient Care Team: Katrinka Garnette KIDD, MD as PCP - General (Family Medicine)  I have updated your Care Teams any recent Medical Services you may have received from other providers in the past year.     Assessment:   This is a routine wellness examination for Savannah Morrow.  Hearing/Vision screen Hearing Screening - Comments:: Left ear hearing aids  Vision Screening - Comments:: Wears rx glasses - up to date with routine eye exams with Dr Octavia &amp; Dr Lonni Gentry at [facility]    Goals Addressed             This Visit's Progress    Patient Stated       Work on balance        Depression Screen     05/23/2024    9:25 AM 06/09/2023    8:22 AM 05/18/2023   10:06 AM 12/09/2022    8:17 AM 06/02/2022    1:57 PM 05/23/2021    1:51 PM 11/12/2020   11:49 AM  PHQ 2/9 Scores  PHQ - 2 Score 0 0 0 0 0 0 0  PHQ- 9 Score  0  1  Fall Risk     05/22/2024    8:21 PM 06/09/2023    8:13 AM 05/14/2023    1:30 PM 12/09/2022    8:17 AM 06/02/2022    2:02 PM  Fall Risk   Falls in the past year? 0 0 0 1 1  Number falls in past yr: 0 0 0 0 1  Injury with Fall? 0 0 0 0 0  Risk for fall due to : Impaired balance/gait;Impaired mobility No Fall Risks Impaired  vision;Impaired balance/gait History of fall(s) Impaired vision;Impaired balance/gait;Impaired mobility;History of fall(s)  Risk for fall due to: Comment     recent DX of MS  Follow up Falls prevention discussed Falls evaluation completed Falls prevention discussed Falls evaluation completed Falls prevention discussed      Data saved with a previous flowsheet row definition    MEDICARE RISK AT HOME:  Medicare Risk at Home Any stairs in or around the home?: (Patient-Rptd) Yes If so, are there any without handrails?: (Patient-Rptd) Yes Home free of loose throw rugs in walkways, pet beds, electrical cords, etc?: (Patient-Rptd) Yes Adequate lighting in your home to reduce risk of falls?: (Patient-Rptd) Yes Life alert?: (Patient-Rptd) No Use of a cane, walker or w/c?: (Patient-Rptd) Yes Grab bars in the bathroom?: (Patient-Rptd) Yes Shower chair or bench in shower?: (Patient-Rptd) Yes Elevated toilet seat or a handicapped toilet?: (Patient-Rptd) No  TIMED UP AND GO:  Was the test performed?  No  Cognitive Function: 6CIT completed        05/23/2024    9:30 AM 05/18/2023   10:12 AM 06/02/2022    2:06 PM 05/23/2021    1:58 PM 05/17/2020    2:10 PM  6CIT Screen  What Year? 0 points 0 points 0 points 0 points 0 points  What month? 0 points 0 points 0 points 0 points 0 points  What time? 0 points 0 points 0 points 0 points 0 points  Count back from 20 0 points 0 points 0 points 0 points 0 points  Months in reverse 0 points 0 points 0 points 0 points 0 points  Repeat phrase 0 points 0 points 0 points 0 points 2 points  Total Score 0 points 0 points 0 points 0 points 2 points    Immunizations Immunization History  Administered Date(s) Administered   Fluad Quad(high Dose 65+) 07/25/2019   Influenza Split 07/21/2011, 08/06/2012   Influenza Whole 08/24/2009   Influenza, High Dose Seasonal PF 07/24/2020, 07/25/2021, 07/24/2022, 08/31/2023   Influenza, Seasonal, Injecte, Preservative Fre  07/18/2015   Influenza,inj,Quad PF,6+ Mos 07/27/2013   Influenza-Unspecified 07/27/2014, 08/01/2016, 07/27/2017, 08/09/2018, 07/25/2019   Moderna Sars-Covid-2 Vaccination 01/10/2020, 02/07/2020, 11/27/2020   Pfizer(Comirnaty)Fall Seasonal Vaccine 12 years and older 08/31/2023   Pneumococcal Conjugate-13 05/21/2020   Pneumococcal Polysaccharide-23 05/11/2019   Td 10/20/2005   Tdap 04/07/2016   Zoster Recombinant(Shingrix ) 04/27/2018, 07/28/2018   Zoster, Live 03/15/2014    Screening Tests Health Maintenance  Topic Date Due   COVID-19 Vaccine (5 - Moderna risk 2024-25 season) 02/28/2024   INFLUENZA VACCINE  05/20/2024   Medicare Annual Wellness (AWV)  05/23/2025   MAMMOGRAM  09/08/2025   DTaP/Tdap/Td (3 - Td or Tdap) 04/07/2026   Pneumococcal Vaccine: 50+ Years  Completed   DEXA SCAN  Completed   Hepatitis C Screening  Completed   Zoster Vaccines- Shingrix   Completed   Hepatitis B Vaccines  Aged Out   HPV VACCINES  Aged Out   Meningococcal B Vaccine  Aged Out   Colonoscopy  Discontinued    Health Maintenance  Health Maintenance Due  Topic Date Due   COVID-19 Vaccine (5 - Moderna risk 2024-25 season) 02/28/2024   INFLUENZA VACCINE  05/20/2024   Health Maintenance Items Addressed: See Nurse Notes at the end of this note  Additional Screening:  Vision Screening: Recommended annual ophthalmology exams for early detection of glaucoma and other disorders of the eye. Would you like a referral to an eye doctor? No    Dental Screening: Recommended annual dental exams for proper oral hygiene  Community Resource Referral / Chronic Care Management: CRR required this visit?  No   CCM required this visit?  No   Plan:    I have personally reviewed and noted the following in the patient's chart:   Medical and social history Use of alcohol, tobacco or illicit drugs  Current medications and supplements including opioid prescriptions. Patient is not currently taking opioid  prescriptions. Functional ability and status Nutritional status Physical activity Advanced directives List of other physicians Hospitalizations, surgeries, and ER visits in previous 12 months Vitals Screenings to include cognitive, depression, and falls Referrals and appointments  In addition, I have reviewed and discussed with patient certain preventive protocols, quality metrics, and best practice recommendations. A written personalized care plan for preventive services as well as general preventive health recommendations were provided to patient.   Savannah VEAR Haws, LPN   10/24/7972   After Visit Summary: (MyChart) Due to this being a telephonic visit, the after visit summary with patients personalized plan was offered to patient via MyChart   Notes: Nothing significant to report at this time.

## 2024-06-23 DIAGNOSIS — H52223 Regular astigmatism, bilateral: Secondary | ICD-10-CM | POA: Diagnosis not present

## 2024-06-23 DIAGNOSIS — H43813 Vitreous degeneration, bilateral: Secondary | ICD-10-CM | POA: Diagnosis not present

## 2024-06-23 DIAGNOSIS — H04123 Dry eye syndrome of bilateral lacrimal glands: Secondary | ICD-10-CM | POA: Diagnosis not present

## 2024-06-23 DIAGNOSIS — H5201 Hypermetropia, right eye: Secondary | ICD-10-CM | POA: Diagnosis not present

## 2024-07-09 ENCOUNTER — Encounter: Payer: Self-pay | Admitting: Family Medicine

## 2024-07-11 NOTE — Telephone Encounter (Signed)
 Updated patients chart with updated Flu and RSV given on 07/09/24 per patient at [facility] [address]

## 2024-07-13 ENCOUNTER — Other Ambulatory Visit (HOSPITAL_COMMUNITY): Payer: Self-pay

## 2024-07-13 ENCOUNTER — Encounter: Payer: Self-pay | Admitting: Family Medicine

## 2024-07-13 ENCOUNTER — Other Ambulatory Visit: Payer: Self-pay

## 2024-07-13 ENCOUNTER — Ambulatory Visit (INDEPENDENT_AMBULATORY_CARE_PROVIDER_SITE_OTHER): Admitting: Family Medicine

## 2024-07-13 VITALS — BP 118/68 | HR 62 | Temp 98.4°F | Ht 62.0 in | Wt 212.4 lb

## 2024-07-13 DIAGNOSIS — M85851 Other specified disorders of bone density and structure, right thigh: Secondary | ICD-10-CM

## 2024-07-13 DIAGNOSIS — Z0000 Encounter for general adult medical examination without abnormal findings: Secondary | ICD-10-CM | POA: Diagnosis not present

## 2024-07-13 DIAGNOSIS — E559 Vitamin D deficiency, unspecified: Secondary | ICD-10-CM

## 2024-07-13 DIAGNOSIS — E785 Hyperlipidemia, unspecified: Secondary | ICD-10-CM

## 2024-07-13 DIAGNOSIS — R29898 Other symptoms and signs involving the musculoskeletal system: Secondary | ICD-10-CM | POA: Diagnosis not present

## 2024-07-13 DIAGNOSIS — R739 Hyperglycemia, unspecified: Secondary | ICD-10-CM | POA: Diagnosis not present

## 2024-07-13 DIAGNOSIS — G35 Multiple sclerosis: Secondary | ICD-10-CM

## 2024-07-13 LAB — COMPREHENSIVE METABOLIC PANEL WITH GFR
ALT: 18 U/L (ref 0–35)
AST: 17 U/L (ref 0–37)
Albumin: 4.4 g/dL (ref 3.5–5.2)
Alkaline Phosphatase: 80 U/L (ref 39–117)
BUN: 19 mg/dL (ref 6–23)
CO2: 33 meq/L — ABNORMAL HIGH (ref 19–32)
Calcium: 10.2 mg/dL (ref 8.4–10.5)
Chloride: 98 meq/L (ref 96–112)
Creatinine, Ser: 0.79 mg/dL (ref 0.40–1.20)
GFR: 75.8 mL/min (ref >60.00)
Glucose, Bld: 99 mg/dL (ref 70–99)
Potassium: 4.3 meq/L (ref 3.5–5.1)
Sodium: 138 meq/L (ref 135–145)
Total Bilirubin: 0.8 mg/dL (ref 0.2–1.2)
Total Protein: 7.5 g/dL (ref 6.0–8.3)

## 2024-07-13 LAB — CBC WITH DIFFERENTIAL/PLATELET
Basophils Absolute: 0 K/uL (ref 0.0–0.1)
Basophils Relative: 0.4 % (ref 0.0–3.0)
Eosinophils Absolute: 0.2 K/uL (ref 0.0–0.7)
Eosinophils Relative: 3.4 % (ref 0.0–5.0)
HCT: 40.1 % (ref 36.0–46.0)
Hemoglobin: 13.5 g/dL (ref 12.0–15.0)
Lymphocytes Relative: 14.1 % (ref 12.0–46.0)
Lymphs Abs: 0.7 K/uL (ref 0.7–4.0)
MCHC: 33.6 g/dL (ref 30.0–36.0)
MCV: 88.2 fl (ref 78.0–100.0)
Monocytes Absolute: 0.5 K/uL (ref 0.1–1.0)
Monocytes Relative: 10.9 % (ref 3.0–12.0)
Neutro Abs: 3.4 K/uL (ref 1.4–7.7)
Neutrophils Relative %: 71.2 % (ref 43.0–77.0)
Platelets: 198 K/uL (ref 150.0–400.0)
RBC: 4.55 Mil/uL (ref 3.87–5.11)
RDW: 13.9 % (ref 11.5–15.5)
WBC: 4.8 K/uL (ref 4.0–10.5)

## 2024-07-13 LAB — LIPID PANEL
Cholesterol: 119 mg/dL (ref 0–200)
HDL: 41.8 mg/dL (ref >39.00)
LDL Cholesterol: 57 mg/dL (ref 0–99)
NonHDL: 77.58
Total CHOL/HDL Ratio: 3
Triglycerides: 101 mg/dL (ref 0.0–149.0)
VLDL: 20.2 mg/dL (ref 0.0–40.0)

## 2024-07-13 LAB — HEMOGLOBIN A1C: Hgb A1c MFr Bld: 5.9 % (ref 4.6–6.5)

## 2024-07-13 LAB — VITAMIN D 25 HYDROXY (VIT D DEFICIENCY, FRACTURES): VITD: 60.66 ng/mL (ref 30.00–100.00)

## 2024-07-13 MED ORDER — POTASSIUM CHLORIDE CRYS ER 20 MEQ PO TBCR
20.0000 meq | EXTENDED_RELEASE_TABLET | Freq: Every day | ORAL | 3 refills | Status: AC
  Filled 2024-07-13: qty 90, 90d supply, fill #0
  Filled 2024-10-31: qty 90, 90d supply, fill #1

## 2024-07-13 NOTE — Progress Notes (Signed)
 Phone [phone]   Subjective:  Patient presents today for their annual physical. Chief complaint-noted.   See problem oriented charting- ROS- full  review of systems was completed and negative except for topics noted under acute/chronic concerns   The following were reviewed and entered/updated in epic: Past Medical History:  Diagnosis Date   Allergy    Arthritis    knees   Cataract 2018   Bilateral surgery Jul/Aug 2020   Crohn's disease (HCC)    colon resection in 20. Dr. Avram unsure of diagnosis- no evidence on colonoscopy   Difficult intravenous access 07/13/2023   Heart murmur    Hx of adenomatous colonic polyps 03/17/2018   Hyperlipidemia    Hypertension    Multiple sclerosis    Neuromuscular disorder (HCC) 11/2021   Diagnosed with Late Onset Multiple Sclerosis   Uterine cancer (HCC) 1989   s/p hysterectomy   Patient Active Problem List   Diagnosis Date Noted   Multiple sclerosis 12/23/2021    Priority: High   Morbid obesity (HCC) 03/07/2011    Priority: High   Vitamin D  deficiency 12/23/2021    Priority: Medium    Osteopenia 05/21/2020    Priority: Medium    Hx of adenomatous colonic polyps 03/17/2018    Priority: Medium    Family history of cerebrovascular accident (CVA) in sister 10/06/2017    Priority: Medium    Hyperglycemia 04/07/2016    Priority: Medium    Crohn's disease (HCC)     Priority: Medium    Hyperlipidemia 06/02/2007    Priority: Medium    Essential hypertension 06/02/2007    Priority: Medium    S/P cervical spinal fusion 12/23/2021    Priority: Low   Hearing loss 11/11/2019    Priority: Low   S/P total hysterectomy 04/14/2018    Priority: Low   Chest pain 03/30/2014    Priority: Low   Osteoarthritis of both knees 01/10/2010    Priority: Low   Edema 01/10/2010    Priority: Low   Allergic rhinitis 06/02/2007    Priority: Low   Difficult intravenous access 07/13/2023   Numbness 03/26/2022   Gait disturbance 03/26/2022    High risk medication use 12/23/2021   Occipital neuralgia of left side 12/23/2021   Spinal stenosis in cervical region 12/18/2020   Past Surgical History:  Procedure Laterality Date   ABDOMINAL HYSTERECTOMY  1989   Total abdominal, uterine cancer   APPENDECTOMY     BREAST BIOPSY Left 03/2019   fatty tissue   COLON SURGERY  1978-79   COLONOSCOPY     EYE SURGERY  July & Aug 2020   Bilateral Cataracts removed   OOPHORECTOMY     x1   SPINE SURGERY  03/01/2021   Anterior Cervical Discectomy C5,6,7 with Cadeverous Bone Fusion   total hysterectomy     WISDOM TOOTH EXTRACTION      Family History  Problem Relation Age of Onset   Uterine cancer Mother    Alzheimer's disease Mother        died 11/28/2019  Diabetes Mother    Arthritis Mother    Cancer Mother    Hypertension Mother    Heart disease Father    Hypertension Father    Cancer Father        b cell lymphoma   CVA Father        passed from stroke 34   AVM Sister        8 years younger. brain. significant in  hospital 6 weeks. Ltach/trach feeding tube   Breast cancer Daughter 62   Breast cancer Maternal Grandmother    Cancer Maternal Grandmother    Diabetes Brother    Diabetes Brother    Obesity Brother    Colon cancer Neg Hx    Esophageal cancer Neg Hx    Stomach cancer Neg Hx    Rectal cancer Neg Hx     Medications- reviewed and updated Current Outpatient Medications  Medication Sig Dispense Refill   atenolol -chlorthalidone  (TENORETIC ) 50-25 MG tablet Take 1 tablet by mouth daily. 90 tablet 3   atorvastatin  (LIPITOR) 20 MG tablet Take 1 tablet (20 mg total) by mouth daily. 90 tablet 3   Cholecalciferol (VITAMIN D3) 50 MCG (2000 UT) capsule Take by mouth daily.     fluticasone  (FLONASE ) 50 MCG/ACT nasal spray Place 1 spray into both nostrils daily. 48 g 3   loratadine (CLARITIN) 10 MG tablet Take 10 mg by mouth daily as needed.     Multiple Vitamins-Minerals (MULTIVITAMIN ADULTS 50+ PO) Take 1 tablet by mouth  daily.     potassium chloride  SA (KLOR-CON  M) 20 MEQ tablet Take 1 tablet (20 mEq total) by mouth daily. 90 tablet 3   No current facility-administered medications for this visit.    Allergies-reviewed and updated Allergies  Allergen Reactions   Meperidine Hcl Anaphylaxis   Morphine    Percocet [Oxycodone -Acetaminophen ]     rash   Mupirocin  Rash   Sulfamethoxazole-Trimethoprim Rash    Social History   Social History Narrative   Married 47 years in 2020. 3 children. 7 grandchildren. 4 greatgrandchildren.    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 Oropharynx normal Neck: no thyromegaly CV: RRR no murmurs rubs or gallops Lungs: CTAB no crackles, wheeze, rhonchi Abdomen: soft/nontender/nondistended/normal bowel sounds. No rebound or guarding.  Ext: no edema Skin: warm, dry Neuro: grossly normal, moves all extremities, PERRLA   Assessment and Plan   68 y.o. female presenting for annual physical.  Health Maintenance counseling: 1. Anticipatory guidance: Patient counseled regarding regular dental exams -q6 months, eye exams - yearly,  avoiding smoking and second hand smoke , limiting alcohol to 1 beverage per day- none , no illicit drugs .   2. Risk factor reduction:  Advised patient of need for regular exercise and diet rich and fruits and vegetables to reduce risk of heart attack and stroke.  Exercise- was not feeling safe on her version of treadmill- may get sturdier one. Still doing virtual classes with senior resources with [employer]. Summer was tough  walking perimeter of yard and gardening. Hse is really tryign to avoid falls  Diet/weight management-weight largely stable in last year but down 6 pounds from last visit- she had hoped for more as more active in summer. Morbid obesity with BMI over 35 and hypertension and hyperlipidemia  Wt Readings from Last 3 Encounters:  07/13/24 212 lb 6.4 oz (96.3 kg)  05/23/24 211 lb (95.7 kg)  01/04/24 217 lb 8 oz (98.7 kg)  3. Immunizations/screenings/ancillary studies-she is up-to-date other than COVID vaccination- she had a tough reaction a week of illness after last year immunization and is undecided  Immunization History  Administered Date(s) Administered   Fluad Quad(high Dose 65+) 07/25/2019   INFLUENZA, HIGH DOSE SEASONAL PF 07/24/2020, 07/25/2021, 07/24/2022, 08/31/2023, 07/09/2024   Influenza Split 07/21/2011, 08/06/2012   Influenza Whole 08/24/2009   Influenza, Seasonal, Injecte, Preservative Fre 07/18/2015   Influenza,inj,Quad PF,6+ Mos 07/27/2013   Influenza-Unspecified 07/27/2014, 08/01/2016, 07/27/2017, 08/09/2018, 07/25/2019   Moderna Sars-Covid-2 Vaccination 01/10/2020, 02/07/2020, 11/27/2020   Pfizer(Comirnaty)Fall Seasonal Vaccine 12 years and older 08/31/2023   Pneumococcal Conjugate-13 05/21/2020   Pneumococcal Polysaccharide-23 05/11/2019   RSV,unspecified 07/09/2024   Td 10/20/2005   Tdap 04/07/2016   Zoster Recombinant(Shingrix ) 04/27/2018, 07/28/2018   Zoster, Live 03/15/2014  4. Cervical cancer screening- hysterectomy for uterine cancer and removed cervix plus past age based screening recommendations   5. Breast cancer screening-  breast exam = prefers self exams= and mammogram 08/20/23- due in november 6. Colon cancer screening - 07/13/23 and was released by Dr. Avram 7. Skin cancer screening- Dr. Shona in past- no recent visits - advised regular sunscreen use. Denies worrisome, changing, or new skin lesions.  8. Birth control/STD check- hysterectomy/postmenopausal/only  active with husband  9. Osteoporosis screening at 65- slight osteopenia at 63 and 66- prefers to move forward 10. Smoking associated screening - Never smoker   Status of chronic or acute concerns   #social update- daughter with breast cancer in 69's- not doing well after 8 months of chemotherapy- 3 more tumors despite treatment - may be adjusting chemotherapy- tough  #Multiple sclerosis-originally diagnosed after MRI every 20 2023 of cervical spine with MRI of the brain with foci consistent with MS.  Following closely with Dr. Vear.  On Mavenclad x 2 years- completed April 2024 and monitoring (hoping at least 4 years if not 8 years) -potential for side effects as well  - has been a tougher summer with a lot of rain. Tougher to get out in garden.  -more muscle spasms- has considered magnesium glycinate around 300 mg- reasonable  #hypertension S: medication: atenolol -chlorthalidone  50-25Mg .  She also requires potassium A/P: well controlled continue current medications   #hyperlipidemia S: Medication: Atorvastatin  20Mg   Lab Results  Component Value Date   CHOL 138 06/09/2023   HDL 43.00 06/09/2023   LDLCALC 74 06/09/2023   LDLDIRECT 63.0 11/09/2018   TRIG 104.0 06/09/2023   CHOLHDL 3 06/09/2023   A/P: very close to ideal goal- update today- continue current medications   # Hyperglycemia/insulin resistance/prediabetes- peak a1c of 5.8.  #morbid obesity-peak weight 237 S:  Medication: none Lab Results  Component Value Date   HGBA1C 5.6 12/30/2023   HGBA1C 5.5 06/09/2023   HGBA1C 5.4 12/09/2022   A/P: has been looking good- recheck today  #crohn's disease-  S:  Colon resection in 20s. Dr. Avram is not certain this is acurate diagnosis. No evidence on repeat colonoscopy. No blood in stool or abdominal cramping recently  A/P: no ongoing need for evaluation per Dr. Avram  #Right knee pain likely from osteoarthritis- prior injections- Doing her exercises  regularly  #Osteopenia/low bone density-DEXA 06/04/2022 with worst T score -1.5 at right femoral neck but not in range for prescription medication  - vitamin D  2000 units a day still  Recommended follow up: Return in about 6 months (around 01/10/2025) for followup or sooner if needed.Schedule b4 you leave. Future Appointments  Date Time Provider Department Center  08/08/2024  8:30 AM Sater, Charlie LABOR, MD [facility] None  05/25/2025  8:40 AM [facility] ANNUAL WELLNESS VISIT 1 [facility] [place]   Lab/Order associations: fasting   ICD-10-CM   1. Preventative health care  Z00.00     2. Hyperlipidemia, unspecified hyperlipidemia type  E78.5 Comprehensive metabolic panel with GFR    CBC with Differential/Platelet    Lipid panel    3. Hyperglycemia  R73.9 Hemoglobin A1c    4. Morbid obesity (HCC)  E66.01 Hemoglobin A1c    5. Vitamin D  deficiency  E55.9 VITAMIN D  25 Hydroxy (Vit-D Deficiency, Fractures)    6. Osteopenia of neck of right femur  M85.851 DG Bone Density    7. Multiple sclerosis  G35 Ambulatory referral to Physical Therapy    8. Muscular deconditioning  R29.898 Ambulatory referral to Physical Therapy      Meds ordered this encounter  Medications   potassium chloride  SA (KLOR-CON  M) 20 MEQ tablet    Sig: Take 1 tablet (20 mEq total) by mouth daily.    Dispense:  90 tablet    Refill:  3    Return precautions advised.  Garnette Lukes, MD

## 2024-07-13 NOTE — Patient Instructions (Addendum)
 Please stop by lab before you go If you have mychart- we will send your results within 3 business days of us  receiving them.  If you do not have mychart- we will call you about results within 5 business days of us  receiving them.  *please also note that you will see labs on mychart as soon as they post. I will later go in and write notes on them- will say notes from Dr. Katrinka   Schedule your bone density test at check out desk.  - located [address] across the street from [place] - in the basement - you DO NEED an appointment for the bone density tests.   Schedule physical therapy at desk before you leave  Recommended follow up: Return in about 6 months (around 01/10/2025) for followup or sooner if needed.Schedule b4 you leave.

## 2024-07-15 ENCOUNTER — Ambulatory Visit: Payer: Self-pay | Admitting: Family Medicine

## 2024-07-27 ENCOUNTER — Other Ambulatory Visit: Payer: Self-pay | Admitting: Family Medicine

## 2024-07-27 DIAGNOSIS — Z1231 Encounter for screening mammogram for malignant neoplasm of breast: Secondary | ICD-10-CM

## 2024-08-08 ENCOUNTER — Ambulatory Visit: Admitting: Neurology

## 2024-08-08 ENCOUNTER — Encounter: Payer: Self-pay | Admitting: Neurology

## 2024-08-08 VITALS — BP 135/83 | HR 75 | Ht 62.0 in | Wt 219.0 lb

## 2024-08-08 DIAGNOSIS — R200 Anesthesia of skin: Secondary | ICD-10-CM | POA: Diagnosis not present

## 2024-08-08 DIAGNOSIS — G35D Multiple sclerosis, unspecified: Secondary | ICD-10-CM

## 2024-08-08 DIAGNOSIS — R269 Unspecified abnormalities of gait and mobility: Secondary | ICD-10-CM

## 2024-08-08 DIAGNOSIS — E559 Vitamin D deficiency, unspecified: Secondary | ICD-10-CM | POA: Diagnosis not present

## 2024-08-08 NOTE — Progress Notes (Signed)
 [facility]  PATIENT: Savannah Morrow DOB: December 29, 1955  REFERRING DOCTOR OR PCP: Rockey Peru MD (neurosurgery); Garnette Lukes, MD (PCP) SOURCE: Patient, notes from Dr. Peru, imaging and lab reports, MRI images personally reviewed.  _________________________________   HISTORICAL  CHIEF COMPLAINT:  Chief Complaint  Patient presents with   Follow-up    Pt in room 10. Alone. Here for MS follow up.    HISTORY OF PRESENT ILLNESS:  Savannah Morrow is a 68 y.o. woman with multiple sclerosis.  Update 08/08/2024 She did her 1st year of Mavenclad March / April 2023 and second year March / April 2024 (last pill 02/07/2023). She had stomach pain and nasal blisters with the second year but this recovered over 2 weeks.  No infections or other issues.   CBC/D showed a lymphocyte count of 0.5 last week (12/30/23).    Lymphocyte count was back to 0.7 (normal) 07/13/2024.  Vit D was 60 last month.    Gait is her major issue.   She uses a cane and has no falls.  She will use a walking tick outdoors.   All in all, gait is similar to last year.      She does some Tai Chi..  She uses a bannister on stairs.   She tries to walk a 1/2 to 3/4 mile a day as exercise and with regular walking often does > 5000 steps.  She does some virtual exercises on bad weather days (Tai Chi and other)..    She notes left facial and bilateral leg pins/needles senation in hea but not otherwise.   She also has cramps in her legs and R>L arm at time at night.  Walking around helps.   We discussed baclofen or tizanidine if this worsens.    Vision is stable.   No color desaturation.   She wears a left hearing aid     She has stable  mild stress incontinence (cough/laugh) for many years.  Most nights has nocturia x 1.  She wears pads.      She has fatigue the past few months.    This is worse with heat.   She sleeps well most nights and now has a more rgular bedtime..   She denies any cognitive issues.   She notes no  depression or anxiety.      She takes Vit D supplement  MS History She had lower cervical spina and right muscle spasms and pain and numbness in the right arm in C6 dermatome.   She also noted right  hip pain and sometimes the leg would almost give out.   She had surgery May 2022.    She developed Covid-19 during 08/2021.  She felt weaker afterwards and had left neck pain.   She noted an electric shock sensation going down the left neck.     She felt better using an ice pack.     In January she began to have reduced balance and she had pain in the left jaw and left facial numbness.   She improved some but still had pain and had a follow-up MRI 12/09/2021.    It showed the ACDF and a focus c/w MS.    MRI of the brain also showed foci c/w MS.     She is referred for further evaluation and treatment  Several of her mother's cousins have MS.  Her sister had an AVM causing a cerebral hemorrhage.      She is fairly healthy with  HTN and borderline DM.    OTHER She had a TB exposure and had a positive skin TB test.  She had multiple CXR afterwards - all fine.   Last one 2010  Imaging review  MRI of the cervical spine 12/09/2021 shows a T2 hyperintense focus laterally to the right and the spinal cord adjacent to C7 and T1.   Additionally, there is a left posterolateral focus adjacent to C1-C2 that also appeared to be present on the 2022 MRI.   On the 2023 MRI, she is status post C5-C7 ACDF.  This study was compared to the MRI from 12/15/2020.  At that time, she had significant degenerative changes at C5-C6 and C6-C7 and also had spinal stenosis and foraminal narrowing at those levels.  The 2 probable demyelinating foci are present on this MRI.  MRI of the brain 12/09/2021 showed scattered T2/FLAIR hyperintense foci in the periventricular, juxtacortical and deep white matter of the hemispheres in a pattern compatible with multiple sclerosis.  There may be 1 small hyperintensity in the left basis pontis.  This  study was done without contrast.  MRI of the brain 12/07/2022 showed no changes   REVIEW OF SYSTEMS: Constitutional: No fevers, chills, sweats, or change in appetite Eyes: No visual changes, double vision, eye pain Ear, nose and throat: No hearing loss, ear pain, nasal congestion, sore throat Cardiovascular: No chest pain, palpitations Respiratory:  No shortness of breath at rest or with exertion.   No wheezes GastrointestinaI: No nausea, vomiting, diarrhea, abdominal pain, fecal incontinence Genitourinary:  No dysuria, urinary retention or frequency.  No nocturia. Musculoskeletal:  No neck pain, back pain Integumentary: No rash, pruritus, skin lesions Neurological: as above Psychiatric: No depression at this time.  No anxiety Endocrine: No palpitations, diaphoresis, change in appetite, change in weigh or increased thirst Hematologic/Lymphatic:  No anemia, purpura, petechiae. Allergic/Immunologic: No itchy/runny eyes, nasal congestion, recent allergic reactions, rashes  ALLERGIES: Allergies  Allergen Reactions   Meperidine Hcl Anaphylaxis   Morphine    Percocet [Oxycodone -Acetaminophen ]     rash   Mupirocin  Rash   Sulfamethoxazole-Trimethoprim Rash    HOME MEDICATIONS:  Current Outpatient Medications:    atenolol -chlorthalidone  (TENORETIC ) 50-25 MG tablet, Take 1 tablet by mouth daily., Disp: 90 tablet, Rfl: 3   atorvastatin  (LIPITOR) 20 MG tablet, Take 1 tablet (20 mg total) by mouth daily., Disp: 90 tablet, Rfl: 3   Cholecalciferol (VITAMIN D3) 50 MCG (2000 UT) capsule, Take by mouth daily., Disp: , Rfl:    fluticasone  (FLONASE ) 50 MCG/ACT nasal spray, Place 1 spray into both nostrils daily., Disp: 48 g, Rfl: 3   loratadine (CLARITIN) 10 MG tablet, Take 10 mg by mouth daily as needed., Disp: , Rfl:    Multiple Vitamins-Minerals (MULTIVITAMIN ADULTS 50+ PO), Take 1 tablet by mouth daily., Disp: , Rfl:    potassium chloride  SA (KLOR-CON  M) 20 MEQ tablet, Take 1 tablet (20 mEq  total) by mouth daily., Disp: 90 tablet, Rfl: 3  PAST MEDICAL HISTORY: Past Medical History:  Diagnosis Date   Allergy    Arthritis    knees   Cataract 2018   Bilateral surgery Jul/Aug 2020   Crohn's disease (HCC)    colon resection in 20. Dr. Avram unsure of diagnosis- no evidence on colonoscopy   Difficult intravenous access 07/13/2023   Heart murmur    Hx of adenomatous colonic polyps 03/17/2018   Hyperlipidemia    Hypertension    Multiple sclerosis    Neuromuscular disorder (HCC) 11/2021  Diagnosed with Late Onset Multiple Sclerosis   Uterine cancer (HCC) 1989   s/p hysterectomy    PAST SURGICAL HISTORY: Past Surgical History:  Procedure Laterality Date   ABDOMINAL HYSTERECTOMY  1989   Total abdominal, uterine cancer   APPENDECTOMY     BREAST BIOPSY Left 03/2019   fatty tissue   COLON SURGERY  1978-79   COLONOSCOPY     EYE SURGERY  July & Aug 2020   Bilateral Cataracts removed   OOPHORECTOMY     x1   SPINE SURGERY  03/01/2021   Anterior Cervical Discectomy C5,6,7 with Cadeverous Bone Fusion   total hysterectomy     WISDOM TOOTH EXTRACTION      FAMILY HISTORY: Family History  Problem Relation Age of Onset   Uterine cancer Mother    Alzheimer's disease Mother        died 11-17-19  Diabetes Mother    Arthritis Mother    Cancer Mother    Hypertension Mother    Heart disease Father    Hypertension Father    Cancer Father        b cell lymphoma   CVA Father        passed from stroke 63   AVM Sister        8 years younger. brain. significant in hospital 6 weeks. Ltach/trach feeding tube   Breast cancer Daughter 61   Breast cancer Maternal Grandmother    Cancer Maternal Grandmother    Diabetes Brother    Diabetes Brother    Obesity Brother    Colon cancer Neg Hx    Esophageal cancer Neg Hx    Stomach cancer Neg Hx    Rectal cancer Neg Hx     SOCIAL HISTORY:  Social History   Socioeconomic History   Marital status: Married    Spouse  name: Tanda   Number of children: 3   Years of education: Not on file   Highest education level: Bachelor's degree (e.g., BA, AB, BS)  Occupational History   Occupation: retired  Tobacco Use   Smoking status: Never   Smokeless tobacco: Never  Vaping Use   Vaping status: Never Used  Substance and Sexual Activity   Alcohol use: No   Drug use: No   Sexual activity: Yes    Birth control/protection: Post-menopausal  Other Topics Concern   Not on file  Social History Narrative   Married 47 years in 2020. 3 children. 7 grandchildren. 4 greatgrandchildren.    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, multimedia work 2010, last worked with [employer]   Hobbies: working in garden, camping in RV   Right handed   Caffeine: zero   Social Drivers of Corporate investment banker Strain: Low Risk  (05/22/2024)   Overall Financial Resource Strain (CARDIA)    Difficulty of Paying Living Expenses: Not hard at all  Food Insecurity: No Food Insecurity (05/22/2024)   Hunger Vital Sign    Worried About Running Out of Food in the Last Year: Never true    Ran Out of Food in the Last Year: Never true  Transportation Needs: No Transportation Needs (05/22/2024)   PRAPARE - Administrator, Civil Service (Medical): No    Lack of Transportation (Non-Medical): No  Physical Activity: Sufficiently Active (05/22/2024)   Exercise Vital Sign    Days of Exercise per Week: 5 days    Minutes of Exercise per Session: 30 min  Stress: No Stress Concern Present (05/22/2024)  [employer] of Occupational Health - Occupational Stress Questionnaire    Feeling of Stress: Not at all  Social Connections: Socially Integrated (05/22/2024)   Social Connection and Isolation Panel    Frequency of Communication with Friends and Family: More than three times a week    Frequency of Social Gatherings with Friends and Family: More than three times a week    Attends Religious Services: More than 4 times per year    Active Member of [employer] or  Organizations: Yes    Attends Banker Meetings: More than 4 times per year    Marital Status: Married  Catering manager Violence: Not At Risk (05/23/2024)   Humiliation, Afraid, Rape, and Kick questionnaire    Fear of Current or Ex-Partner: No    Emotionally Abused: No    Physically Abused: No    Sexually Abused: No     PHYSICAL EXAM  Vitals:   08/08/24 0828 08/08/24 0831  BP: (!) 145/86 135/83  Pulse: 75   Weight: 219 lb (99.3 kg)   Height: 5' 2 (1.575 m)     Body mass index is 40.06 kg/m.   General: The patient is well-developed and well-nourished and in no acute distress  HEENT:  Head is [place]/AT.  Sclera are anicteric.   Skin: Extremities are without rash or  edema.    Neurologic Exam  Mental status: The patient is alert and oriented x 3 at the time of the examination. The patient has apparent normal recent and remote memory, with an apparently normal attention span and concentration ability.   Speech is normal.  Cranial nerves: Extraocular movements are full. Pupils are equal, round, and reactive to light and accomodation.  Facial strength and sensation are fine . No obvious hearing deficits are noted.  Motor:  Muscle bulk is normal.   Tone is normal. Strength is  5 / 5 in all 4 extremities except 4+/5 riht iliopsoas and toes.   Sensory: Sensory testing is intact to pinprick, soft touch and vibration sensation in all 4 extremities.  Coordination: Cerebellar testing reveals good finger-nose-finger and reduced right heel-to-shin bilaterally.  Gait and station: Station is normal.   Gait is mildly wide but swith mild reduced stride,  without the cane ankle turned in 3 steps..  Tandem gait is  wide .    Romberg is negative.   Reflexes: Deep tendon reflexes are symmetric and normal bilaterally in arms and legs.       DIAGNOSTIC DATA (LABS, IMAGING, TESTING) - I reviewed patient records, labs, notes, testing and imaging myself where available.  Lab Results   Component Value Date   WBC 4.8 07/13/2024   HGB 13.5 07/13/2024   HCT 40.1 07/13/2024   MCV 88.2 07/13/2024   PLT 198.0 07/13/2024      Component Value Date/Time   NA 138 07/13/2024 0940   NA 140 03/11/2023 1420   K 4.3 07/13/2024 0940   CL 98 07/13/2024 0940   CO2 33 (H) 07/13/2024 0940   GLUCOSE 99 07/13/2024 0940   GLUCOSE 101 (H) 10/22/2006 1217   BUN 19 07/13/2024 0940   BUN 14 03/11/2023 1420   CREATININE 0.79 07/13/2024 0940   CREATININE 0.79 07/31/2020 1031   CALCIUM  10.2 07/13/2024 0940   PROT 7.5 07/13/2024 0940   PROT 6.5 03/11/2023 1420   ALBUMIN 4.4 07/13/2024 0940   ALBUMIN 4.2 03/11/2023 1420   AST 17 07/13/2024 0940   ALT 18 07/13/2024 0940   ALKPHOS 80 07/13/2024 0940  BILITOT 0.8 07/13/2024 0940   BILITOT 0.7 03/11/2023 1420   GFRNONAA 78.90 01/04/2010 0813   GFRAA 84 10/26/2008 0949   Lab Results  Component Value Date   CHOL 119 07/13/2024   HDL 41.80 07/13/2024   LDLCALC 57 07/13/2024   LDLDIRECT 63.0 11/09/2018   TRIG 101.0 07/13/2024   CHOLHDL 3 07/13/2024   Lab Results  Component Value Date   HGBA1C 5.9 07/13/2024   No results found for: VITAMINB12 Lab Results  Component Value Date   TSH 2.26 04/07/2016       ASSESSMENT AND PLAN  Multiple sclerosis  Gait disturbance  Vitamin D  deficiency  Numbness   Her MS is stable after two courses of Mavenclad.   Gait is about the same.   May do PT   Around time of next visit, check MRI brain.   Will get labs +/- twice a year at PCP office.    Lymphocytes back in normal range now. Stay active and exercise.  She eats well.       Continue Vit D suppl.  Rtc 12 months, will recheck labs then  This visit is part of a comprehensive longitudinal care medical relationship regarding the patients primary diagnosis of MS and related concerns.   Bodi Palmeri A. Vear, MD, [facility] 08/08/2024, 8:52 AM Certified in Neurology, Clinical Neurophysiology, Sleep Medicine and Neuroimaging  [facility]  [facility] [address], [address] [place], [place] [address] [phone]

## 2024-08-09 ENCOUNTER — Ambulatory Visit: Admitting: Physical Therapy

## 2024-08-09 ENCOUNTER — Encounter: Payer: Self-pay | Admitting: Physical Therapy

## 2024-08-09 DIAGNOSIS — M6281 Muscle weakness (generalized): Secondary | ICD-10-CM

## 2024-08-09 DIAGNOSIS — R2689 Other abnormalities of gait and mobility: Secondary | ICD-10-CM | POA: Diagnosis not present

## 2024-08-09 NOTE — Therapy (Signed)
 - OUTPATIENT PHYSICAL THERAPY LOWER EXTREMITY EVALUATION   Patient Name: DAKIYAH HEINKE MRN: [id number] DOB:1956/08/14, 68 y.o., female Today's Date: 08/09/2024  END OF SESSION:  PT End of Session - 08/09/24 1032     Visit Number 1    Number of Visits 16    Date for Recertification  10/04/24    Authorization Type UHC Medicare    PT Start Time 1015    PT Stop Time 1055    PT Time Calculation (min) 40 min    Activity Tolerance Patient tolerated treatment well    Behavior During Therapy [facility] for tasks assessed/performed          Past Medical History:  Diagnosis Date   Allergy    Arthritis    knees   Cataract 2018   Bilateral surgery Jul/Aug 2020   Crohn's disease (HCC)    colon resection in 20. Dr. Avram unsure of diagnosis- no evidence on colonoscopy   Difficult intravenous access 07/13/2023   Heart murmur    Hx of adenomatous colonic polyps 03/17/2018   Hyperlipidemia    Hypertension    Multiple sclerosis    Neuromuscular disorder (HCC) 11/2021   Diagnosed with Late Onset Multiple Sclerosis   Uterine cancer (HCC) 1989   s/p hysterectomy   Past Surgical History:  Procedure Laterality Date   ABDOMINAL HYSTERECTOMY  1989   Total abdominal, uterine cancer   APPENDECTOMY     BREAST BIOPSY Left 03/2019   fatty tissue   COLON SURGERY  1978-79   COLONOSCOPY     EYE SURGERY  July & Aug 2020   Bilateral Cataracts removed   OOPHORECTOMY     x1   SPINE SURGERY  03/01/2021   Anterior Cervical Discectomy C5,6,7 with Cadeverous Bone Fusion   total hysterectomy     WISDOM TOOTH EXTRACTION     Patient Active Problem List   Diagnosis Date Noted   Difficult intravenous access 07/13/2023   Numbness 03/26/2022   Gait disturbance 03/26/2022   Multiple sclerosis 12/23/2021   High risk medication use 12/23/2021   Vitamin D  deficiency 12/23/2021   Occipital neuralgia of left side 12/23/2021   S/P cervical spinal fusion 12/23/2021   Spinal stenosis in cervical region  12/18/2020   Osteopenia 05/21/2020   Hearing loss 11/11/2019   S/P total hysterectomy 04/14/2018   Hx of adenomatous colonic polyps 03/17/2018   Family history of cerebrovascular accident (CVA) in sister 10/06/2017   Hyperglycemia 04/07/2016   Crohn's disease (HCC)    Chest pain 03/30/2014   Morbid obesity (HCC) 03/07/2011   Osteoarthritis of both knees 01/10/2010   Edema 01/10/2010   Hyperlipidemia 06/02/2007   Essential hypertension 06/02/2007   Allergic rhinitis 06/02/2007     PCP: Garnette Lukes  REFERRING PROVIDER: Garnette Lukes  REFERRING DIAG: MS, muscular deconditioning   THERAPY DIAG:  Other abnormalities of gait and mobility  Muscle weakness (generalized)  Rationale for Evaluation and Treatment: Rehabilitation  ONSET DATE:     SUBJECTIVE:   SUBJECTIVE STATEMENT: Pt Sees neurologist, states that she wants to improve balance. She was diagnosed with MS in 2023. States she doesn't feel as comfortable with her walking and balance.  In house, does not use cane, but feels like she needs to hold on to furniture, feels like she has lost strength overall, more in arms.  Uses small base quad cane (likes it) . Also has walking stick.  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 Well healed-  mild pain with heavy lifting.  Lives with husband  Stairs: 1 step to enter with railing. (3 steps into RV in summer) Grab bars in bathroom. Walk in shower with seat.  Has some arm weights that she uses. Does class at senior center- 2x/wk. But does virtual, does a class every day.  Main complaint: walking   PERTINENT HISTORY: MS, ACDF, OA R knee,   PAIN:  Are you having pain? Yes: NPRS scale: 2-3/10 Pain location: R knee Pain description: sore, stiff, weak Aggravating factors: bending, squat Relieving factors: rest   PRECAUTIONS: Fall  WEIGHT BEARING RESTRICTIONS: No  FALLS:  Has patient fallen in last 6 months? No  PLOF: Independent  PATIENT GOALS:   Improve balance   NEXT MD VISIT:   OBJECTIVE:   DIAGNOSTIC FINDINGS:   PATIENT SURVEYS:    COGNITION: Overall cognitive status: Within functional limits for tasks assessed     SENSATION: WFL  EDEMA:    POSTURE:     PALPATION:   LOWER EXTREMITY ROM: Hips: WFL Knees: mild limitation for full flexion/pain at end range.   LOWER EXTREMITY MMT:  MMT Left eval Right  eval  Hip flexion 4 4-  Hip extension    Hip abduction    Hip adduction    Hip internal rotation    Hip external rotation    Knee flexion 4+ 4  Knee extension 4+ 4  Ankle dorsiflexion    Ankle plantarflexion    Ankle inversion    Ankle eversion     (Blank rows = not tested)  LOWER EXTREMITY SPECIAL TESTS:    FUNCTIONAL TESTS:    5 time sit to stand:   19. 38 sec DGI:  11    GAIT: Distance walked: 150 ft Assistive device utilized: Quad cane small base Level of assistance: Modified independence Comments: slow, cautious gait,    TODAY'S TREATMENT:                                                                                                                              DATE:   Eval:   PATIENT EDUCATION:  Education details: PT POC, Exam findings, HEP Person educated: Patient Education method: Explanation, Demonstration, Tactile cues, Verbal cues, and Handouts Education comprehension: verbalized understanding, returned demonstration, verbal cues required, tactile cues required, and needs further education   HOME EXERCISE PROGRAM: Will issue next visit   ASSESSMENT:  CLINICAL IMPRESSION: Patient presents with primary complaint of Balance and gait issues from MS. She has decreased dynamic balance with gait and DGI testing. She has decreased confidence and safety with functional activity. She will benefit from education on best use of AD for gait efficiency and safety.  Pt with decreased ability for full functional activities. Pt will  benefit from skilled PT to improve deficits and  pain and to return to PLOF.   OBJECTIVE IMPAIRMENTS: Abnormal gait, decreased activity tolerance, decreased balance, decreased knowledge of use of DME, decreased mobility,  difficulty walking, decreased strength, increased muscle spasms, impaired UE functional use, improper body mechanics, and pain.   ACTIVITY LIMITATIONS: carrying, lifting, bending, squatting, stairs, reach over head, and locomotion level  PARTICIPATION LIMITATIONS: meal prep, cleaning, laundry, shopping, community activity, and yard work  PERSONAL FACTORS: 1 comorbidity: MS are also affecting patient's functional outcome.   REHAB POTENTIAL: Good  CLINICAL DECISION MAKING: Stable/uncomplicated  EVALUATION COMPLEXITY: Low   GOALS: Goals reviewed with patient? Yes  SHORT TERM GOALS: Target date: 08/30/2024   Pt to be independent with initial HEP  Goal status: INITIAL  2.  Pt to trial cane use in L hand, for improved support of R LE.   Goal status: INITIAL    LONG TERM GOALS: Target date: 10/04/2024   Pt to be independent with final HEP  Goal status: INITIAL  2.  Pt to demo improved score on DGI by at least 4 points.   Goal status: INITIAL  3.  Pt to demo optimal mechanics with ambulation with cane, for community distances, to improve safety and ability for activity   Goal status: INITIAL  4.  Pt to demo optimal mechanics and balance with bend, lift, squat, to improve balance with IADLs.   Goal status: INITIAL   PLAN:  PT FREQUENCY: 1-2x/week  PT DURATION: 8 weeks  PLANNED INTERVENTIONS: Therapeutic exercises, Therapeutic activity, Neuromuscular re-education, Patient/Family education, Self Care, Joint mobilization, Joint manipulation, Stair training, Orthotic/Fit training, DME instructions, Aquatic Therapy, Dry Needling, Electrical stimulation, Cryotherapy, Moist heat, Taping, Ultrasound, Ionotophoresis 4mg /ml Dexamethasone, Manual therapy,  Vasopneumatic device, Traction, Spinal manipulation,  Spinal mobilization,Balance training, Gait training,   PLAN FOR NEXT SESSION: issue HEP - lumbar, hip, knee mobility, strength for R knee, hips, standing strength.      Tinnie Don, PT, DPT 7:16 PM  08/09/24

## 2024-08-17 ENCOUNTER — Encounter: Payer: Self-pay | Admitting: Physical Therapy

## 2024-08-17 ENCOUNTER — Ambulatory Visit: Admitting: Physical Therapy

## 2024-08-17 DIAGNOSIS — R2689 Other abnormalities of gait and mobility: Secondary | ICD-10-CM | POA: Diagnosis not present

## 2024-08-17 DIAGNOSIS — M6281 Muscle weakness (generalized): Secondary | ICD-10-CM

## 2024-08-17 NOTE — Therapy (Signed)
 - OUTPATIENT PHYSICAL THERAPY LOWER EXTREMITY TREATMENT   Patient Name: Savannah Morrow MRN: [id number] DOB:12-22-1955, 68 y.o., female Today's Date: 08/17/2024  END OF SESSION:  PT End of Session - 08/17/24 1019     Visit Number 3    Number of Visits 16    Date for Recertification  10/04/24    Authorization Type [employer] Medicare -  12  through 12/30    PT Start Time 0935    PT Stop Time 1015    PT Time Calculation (min) 40 min    Activity Tolerance Patient tolerated treatment well    Behavior During Therapy [facility] for tasks assessed/performed           Past Medical History:  Diagnosis Date   Allergy    Arthritis    knees   Cataract 2018   Bilateral surgery Jul/Aug 2020   Crohn's disease (HCC)    colon resection in 20. Dr. Avram unsure of diagnosis- no evidence on colonoscopy   Difficult intravenous access 07/13/2023   Heart murmur    Hx of adenomatous colonic polyps 03/17/2018   Hyperlipidemia    Hypertension    Multiple sclerosis    Neuromuscular disorder (HCC) 11/2021   Diagnosed with Late Onset Multiple Sclerosis   Uterine cancer (HCC) 1989   s/p hysterectomy   Past Surgical History:  Procedure Laterality Date   ABDOMINAL HYSTERECTOMY  1989   Total abdominal, uterine cancer   APPENDECTOMY     BREAST BIOPSY Left 03/2019   fatty tissue   COLON SURGERY  1978-79   COLONOSCOPY     EYE SURGERY  July & Aug 2020   Bilateral Cataracts removed   OOPHORECTOMY     x1   SPINE SURGERY  03/01/2021   Anterior Cervical Discectomy C5,6,7 with Cadeverous Bone Fusion   total hysterectomy     WISDOM TOOTH EXTRACTION     Patient Active Problem List   Diagnosis Date Noted   Difficult intravenous access 07/13/2023   Numbness 03/26/2022   Gait disturbance 03/26/2022   Multiple sclerosis 12/23/2021   High risk medication use 12/23/2021   Vitamin D  deficiency 12/23/2021   Occipital neuralgia of left side 12/23/2021   S/P cervical spinal fusion 12/23/2021   Spinal stenosis in  cervical region 12/18/2020   Osteopenia 05/21/2020   Hearing loss 11/11/2019   S/P total hysterectomy 04/14/2018   Hx of adenomatous colonic polyps 03/17/2018   Family history of cerebrovascular accident (CVA) in sister 10/06/2017   Hyperglycemia 04/07/2016   Crohn's disease (HCC)    Chest pain 03/30/2014   Morbid obesity (HCC) 03/07/2011   Osteoarthritis of both knees 01/10/2010   Edema 01/10/2010   Hyperlipidemia 06/02/2007   Essential hypertension 06/02/2007   Allergic rhinitis 06/02/2007     PCP: Garnette Lukes  REFERRING PROVIDER: Garnette Lukes  REFERRING DIAG: MS, muscular deconditioning   THERAPY DIAG:  Other abnormalities of gait and mobility  Muscle weakness (generalized)  Rationale for Evaluation and Treatment: Rehabilitation  ONSET DATE:     SUBJECTIVE:   SUBJECTIVE STATEMENT: Pt with no new complaints. Brought straight cane today. Feeling stiff all over due to weather.   Eval: Pt Sees neurologist, states that she wants to improve balance. She was diagnosed with MS in 2023. States she doesn't feel as comfortable with her walking and balance.  In house, does not use cane, but feels like she needs to hold on to furniture, feels like she has lost strength overall, more in arms.  Uses  small base quad cane (likes it) . Also has walking stick.  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 Well healed- mild pain with heavy lifting.  Lives with husband  Stairs: 1 step to enter with railing. (3 steps into RV in summer) Grab bars in bathroom. Walk in shower with seat.  Has some arm weights that she uses. [facility]- 2x[facility]. But does virtual, does a class every day.  Main complaint: walking   PERTINENT HISTORY: MS, ACDF, OA R knee,   PAIN:  Are you having pain? Yes: NPRS scale: 2-3/10 Pain location: R knee Pain description: sore, stiff, weak Aggravating factors: bending, squat Relieving factors: rest   PRECAUTIONS: Fall  WEIGHT  BEARING RESTRICTIONS: No  FALLS:  Has patient fallen in last 6 months? No  PLOF: Independent  PATIENT GOALS:  Improve balance   NEXT MD VISIT:   OBJECTIVE:   DIAGNOSTIC FINDINGS:  R knee xray:   IMPRESSION: Severe degenerative joint disease of medial joint space. No acute abnormality seen in the right knee.  PATIENT SURVEYS:    COGNITION: Overall cognitive status: Within functional limits for tasks assessed     SENSATION: WFL  EDEMA:    POSTURE:     PALPATION:   LOWER EXTREMITY ROM: Hips: WFL Knees: mild limitation for full flexion/pain at end range.   LOWER EXTREMITY MMT:  MMT Left eval Right  eval  Hip flexion 4 4-  Hip extension    Hip abduction    Hip adduction    Hip internal rotation    Hip external rotation    Knee flexion 4+ 4  Knee extension 4+ 4  Ankle dorsiflexion    Ankle plantarflexion    Ankle inversion    Ankle eversion     (Blank rows = not tested)  LOWER EXTREMITY SPECIAL TESTS:    FUNCTIONAL TESTS:    5 time sit to stand:   19. 38 sec DGI:  11    GAIT: Distance walked: 150 ft Assistive device utilized: Quad cane small base Level of assistance: Modified independence Comments: slow, cautious gait,  OA in R knee, likes cane in R hand.    TODAY'S TREATMENT:                                                                                                                              DATE:   08/17/2024 Therapeutic Exercise: Aerobic: Supine: Seated:  LAQ 2 x 10 bil;  sit to stand 2 x 5;  Standing:  hip abd 2 x 10, Marching x 20/slow  Stretches:  Neuromuscular Re-education: Gait:  Education and practice for use of SPC, with sequencing. Pt using in R hand comfortably at this time.   45 ft x 2,  3 bouts  Manual Therapy: Therapeutic Activity: Self Care:   PATIENT EDUCATION:  Education details: Education on initial HEP Person educated: Patient Education method: Explanation, Demonstration, Tactile cues,  Verbal cues,  and Handouts Education comprehension: verbalized understanding, returned demonstration, verbal cues required, tactile cues required, and needs further education   HOME EXERCISE PROGRAM: Access Code: [email or web address] URL: [email or web address] Date: 08/17/2024 Prepared by: Tinnie Don  Exercises - Seated Knee Extension AROM  - 1-2 x daily - 2 sets - 10 reps - Seated Ankle Pumps on Table  - 1 x daily - 2 sets - 10 reps - Standing March with Counter Support  - 1 x daily - 2 sets - 10 reps - Standing Hip Abduction with Counter Support  - 1 x daily - 1-2 sets - 5- 10 reps - Sit to Stand  - 1 x daily - 1-2 sets - 10 reps   ASSESSMENT:  CLINICAL IMPRESSION: Educated on initial HEP for LE mobility and strength. Pt with good ability to perform. She is doing well with her weekly exercise classes as well. Focus on education for sequencing and efficiency with SPC today. Pt with improved ability. Will benefit from continued practice with this to improve speed and mechanics, she has R foot crossing in front of L , which increases fall risk.   Eval: Patient presents with primary complaint of Balance and gait issues from MS. She has decreased dynamic balance with gait and DGI testing. She has decreased confidence and safety with functional activity. She will benefit from education on best use of AD for gait efficiency and safety.  Pt with decreased ability for full functional activities. Pt will  benefit from skilled PT to improve deficits and pain and to return to PLOF.   OBJECTIVE IMPAIRMENTS: Abnormal gait, decreased activity tolerance, decreased balance, decreased knowledge of use of DME, decreased mobility, difficulty walking, decreased strength, increased muscle spasms, impaired UE functional use, improper body mechanics, and pain.   ACTIVITY LIMITATIONS: carrying, lifting, bending, squatting, stairs, reach over head, and locomotion level  PARTICIPATION LIMITATIONS: meal prep, cleaning,  laundry, shopping, community activity, and yard work  PERSONAL FACTORS: 1 comorbidity: MS are also affecting patient's functional outcome.   REHAB POTENTIAL: Good  CLINICAL DECISION MAKING: Stable/uncomplicated  EVALUATION COMPLEXITY: Low   GOALS: Goals reviewed with patient? Yes  SHORT TERM GOALS: Target date: 08/30/2024   Pt to be independent with initial HEP  Goal status: INITIAL  2.  Pt to trial cane use in L hand, for improved support of R LE.   Goal status: INITIAL    LONG TERM GOALS: Target date: 10/04/2024   Pt to be independent with final HEP  Goal status: INITIAL  2.  Pt to demo improved score on DGI by at least 4 points.   Goal status: INITIAL  3.  Pt to demo optimal mechanics with ambulation with cane, for community distances, to improve safety and ability for activity   Goal status: INITIAL  4.  Pt to demo optimal mechanics and balance with bend, lift, squat, to improve balance with IADLs.   Goal status: INITIAL   PLAN:  PT FREQUENCY: 1-2x/week  PT DURATION: 8 weeks  PLANNED INTERVENTIONS: Therapeutic exercises, Therapeutic activity, Neuromuscular re-education, Patient/Family education, Self Care, Joint mobilization, Joint manipulation, Stair training, Orthotic/Fit training, DME instructions, Aquatic Therapy, Dry Needling, Electrical stimulation, Cryotherapy, Moist heat, Taping, Ultrasound, Ionotophoresis 4mg /ml Dexamethasone, Manual therapy,  Vasopneumatic device, Traction, Spinal manipulation, Spinal mobilization,Balance training, Gait training,   PLAN FOR NEXT SESSION:   lumbar, hip, knee mobility, strength for R knee, hips, standing strength.   Gait training- R foot crossing in front of L.    Tinnie Don,  PT, DPT 11:30 AM  08/17/24

## 2024-08-19 ENCOUNTER — Ambulatory Visit: Admitting: Physical Therapy

## 2024-08-19 ENCOUNTER — Encounter: Payer: Self-pay | Admitting: Physical Therapy

## 2024-08-19 DIAGNOSIS — R2689 Other abnormalities of gait and mobility: Secondary | ICD-10-CM

## 2024-08-19 DIAGNOSIS — M6281 Muscle weakness (generalized): Secondary | ICD-10-CM

## 2024-08-19 NOTE — Therapy (Signed)
 - OUTPATIENT PHYSICAL THERAPY LOWER EXTREMITY TREATMENT   Patient Name: Savannah Morrow MRN: [id number] DOB:Dec 24, 1955, 68 y.o., female Today's Date: 08/19/2024  END OF SESSION:  PT End of Session - 08/19/24 0929     Visit Number 4    Number of Visits 16    Date for Recertification  10/04/24    Authorization Type [employer] Medicare -  12  through 12/30    PT Start Time 0931    PT Stop Time 1013    PT Time Calculation (min) 42 min    Activity Tolerance Patient tolerated treatment well    Behavior During Therapy [facility] for tasks assessed/performed           Past Medical History:  Diagnosis Date   Allergy    Arthritis    knees   Cataract 2018   Bilateral surgery Jul/Aug 2020   Crohn's disease (HCC)    colon resection in 20. Dr. Avram unsure of diagnosis- no evidence on colonoscopy   Difficult intravenous access 07/13/2023   Heart murmur    Hx of adenomatous colonic polyps 03/17/2018   Hyperlipidemia    Hypertension    Multiple sclerosis    Neuromuscular disorder (HCC) 11/2021   Diagnosed with Late Onset Multiple Sclerosis   Uterine cancer (HCC) 1989   s/p hysterectomy   Past Surgical History:  Procedure Laterality Date   ABDOMINAL HYSTERECTOMY  1989   Total abdominal, uterine cancer   APPENDECTOMY     BREAST BIOPSY Left 03/2019   fatty tissue   COLON SURGERY  1978-79   COLONOSCOPY     EYE SURGERY  July & Aug 2020   Bilateral Cataracts removed   OOPHORECTOMY     x1   SPINE SURGERY  03/01/2021   Anterior Cervical Discectomy C5,6,7 with Cadeverous Bone Fusion   total hysterectomy     WISDOM TOOTH EXTRACTION     Patient Active Problem List   Diagnosis Date Noted   Difficult intravenous access 07/13/2023   Numbness 03/26/2022   Gait disturbance 03/26/2022   Multiple sclerosis 12/23/2021   High risk medication use 12/23/2021   Vitamin D  deficiency 12/23/2021   Occipital neuralgia of left side 12/23/2021   S/P cervical spinal fusion 12/23/2021   Spinal stenosis in  cervical region 12/18/2020   Osteopenia 05/21/2020   Hearing loss 11/11/2019   S/P total hysterectomy 04/14/2018   Hx of adenomatous colonic polyps 03/17/2018   Family history of cerebrovascular accident (CVA) in sister 10/06/2017   Hyperglycemia 04/07/2016   Crohn's disease (HCC)    Chest pain 03/30/2014   Morbid obesity (HCC) 03/07/2011   Osteoarthritis of both knees 01/10/2010   Edema 01/10/2010   Hyperlipidemia 06/02/2007   Essential hypertension 06/02/2007   Allergic rhinitis 06/02/2007     PCP: Garnette Lukes  REFERRING PROVIDER: Garnette Lukes  REFERRING DIAG: MS, muscular deconditioning   THERAPY DIAG:  Other abnormalities of gait and mobility  Muscle weakness (generalized)  Rationale for Evaluation and Treatment: Rehabilitation  ONSET DATE:     SUBJECTIVE:   SUBJECTIVE STATEMENT: Pt with no new complaints. Brought straight cane today. Feeling stiff all over due to weather.   Eval: Pt Sees neurologist, states that she wants to improve balance. She was diagnosed with MS in 2023. States she doesn't feel as comfortable with her walking and balance.  In house, does not use cane, but feels like she needs to hold on to furniture, feels like she has lost strength overall, more in arms.  Uses  small base quad cane (likes it) . Also has walking stick.  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 Well healed- mild pain with heavy lifting.  Lives with husband  Stairs: 1 step to enter with railing. (3 steps into RV in summer) Grab bars in bathroom. Walk in shower with seat.  Has some arm weights that she uses. Does class at senior center- 2x/wk. But does virtual, does a class every day.  Main complaint: walking   PERTINENT HISTORY: MS, ACDF, OA R knee,   PAIN:  Are you having pain? Yes: NPRS scale: 2-3/10 Pain location: R knee Pain description: sore, stiff, weak Aggravating factors: bending, squat Relieving factors: rest   PRECAUTIONS: Fall  WEIGHT  BEARING RESTRICTIONS: No  FALLS:  Has patient fallen in last 6 months? No  PLOF: Independent  PATIENT GOALS:  Improve balance   NEXT MD VISIT:   OBJECTIVE:   DIAGNOSTIC FINDINGS:  R knee xray:   IMPRESSION: Severe degenerative joint disease of medial joint space. No acute abnormality seen in the right knee.  PATIENT SURVEYS:    COGNITION: Overall cognitive status: Within functional limits for tasks assessed     SENSATION: WFL  EDEMA:    POSTURE:     PALPATION:   LOWER EXTREMITY ROM: Hips: WFL Knees: mild limitation for full flexion/pain at end range.   LOWER EXTREMITY MMT:  MMT Left eval Right  eval  Hip flexion 4 4-  Hip extension    Hip abduction    Hip adduction    Hip internal rotation    Hip external rotation    Knee flexion 4+ 4  Knee extension 4+ 4  Ankle dorsiflexion    Ankle plantarflexion    Ankle inversion    Ankle eversion     (Blank rows = not tested)  LOWER EXTREMITY SPECIAL TESTS:    FUNCTIONAL TESTS:    5 time sit to stand:   19. 38 sec DGI:  11    GAIT: Distance walked: 150 ft Assistive device utilized: Quad cane small base Level of assistance: Modified independence Comments: slow, cautious gait,  OA in R knee, likes cane in R hand.    TODAY'S TREATMENT:                                                                                                                              DATE:   08/19/2024 Therapeutic Exercise: Aerobic: Supine: Seated:  LAQ 3lb ,  2 x 10 bil;   Standing:  hip abd 2 x 5 bil (pain in R knee with standing)   Marching x 20/slow ;  Heel raises x 15;  Stretches:  Neuromuscular Re-education: Gait:   Pre-gait:  fwd stepping with visual tape markers x 20;  Step throughs on R x 20 with visual tape markers;    Gait: SPC,  45 ft x 8   Manual Therapy: Therapeutic Activity: Sit to stand from (  slightly higher )  mat table 2 x 10;   Step up 4 in no UE support x 10 bil;   Stairs: 4in, reciprocol  up/down x 4, no/light UE support Self Care:    Therapeutic Exercise: Aerobic: Supine: Seated:  LAQ 2 x 10 bil;  sit to stand 2 x 5;  Standing:  hip abd 2 x 10, Marching x 20/slow  Stretches:  Neuromuscular Re-education: Gait:  Education and practice for use of SPC, with sequencing. Pt using in R hand comfortably at this time.   45 ft x 2,  3 bouts  Manual Therapy: Therapeutic Activity: Self Care:   PATIENT EDUCATION:  Education details: Education on initial HEP Person educated: Patient Education method: Explanation, Demonstration, Actor cues, Verbal cues, and Handouts Education comprehension: verbalized understanding, returned demonstration, verbal cues required, tactile cues required, and needs further education   HOME EXERCISE PROGRAM: Access Code: [email or web address] URL: [email or web address] Date: 08/17/2024 Prepared by: Tinnie Don  Exercises - Seated Knee Extension AROM  - 1-2 x daily - 2 sets - 10 reps - Seated Ankle Pumps on Table  - 1 x daily - 2 sets - 10 reps - Standing March with Counter Support  - 1 x daily - 2 sets - 10 reps - Standing Hip Abduction with Counter Support  - 1 x daily - 1-2 sets - 5- 10 reps - Sit to Stand  - 1 x daily - 1-2 sets - 10 reps   ASSESSMENT:  CLINICAL IMPRESSION: Practice for using visual cues for fwd stepping/anterior propulsion with pre-gait. Pt with improving gait mechanics using SPC. Less crossover seen with walking today on R. Less UE support needed on 4 in step.   Eval: Patient presents with primary complaint of Balance and gait issues from MS. She has decreased dynamic balance with gait and DGI testing. She has decreased confidence and safety with functional activity. She will benefit from education on best use of AD for gait efficiency and safety.  Pt with decreased ability for full functional activities. Pt will  benefit from skilled PT to improve deficits and pain and to return to PLOF.   OBJECTIVE IMPAIRMENTS:  Abnormal gait, decreased activity tolerance, decreased balance, decreased knowledge of use of DME, decreased mobility, difficulty walking, decreased strength, increased muscle spasms, impaired UE functional use, improper body mechanics, and pain.   ACTIVITY LIMITATIONS: carrying, lifting, bending, squatting, stairs, reach over head, and locomotion level  PARTICIPATION LIMITATIONS: meal prep, cleaning, laundry, shopping, community activity, and yard work  PERSONAL FACTORS: 1 comorbidity: MS are also affecting patient's functional outcome.   REHAB POTENTIAL: Good  CLINICAL DECISION MAKING: Stable/uncomplicated  EVALUATION COMPLEXITY: Low   GOALS: Goals reviewed with patient? Yes  SHORT TERM GOALS: Target date: 08/30/2024   Pt to be independent with initial HEP  Goal status: INITIAL  2.  Pt to trial cane use in L hand, for improved support of R LE.   Goal status: INITIAL    LONG TERM GOALS: Target date: 10/04/2024   Pt to be independent with final HEP  Goal status: INITIAL  2.  Pt to demo improved score on DGI by at least 4 points.   Goal status: INITIAL  3.  Pt to demo optimal mechanics with ambulation with cane, for community distances, to improve safety and ability for activity   Goal status: INITIAL  4.  Pt to demo optimal mechanics and balance with bend, lift, squat, to improve balance with IADLs.   Goal status: INITIAL  PLAN:  PT FREQUENCY: 1-2x/week  PT DURATION: 8 weeks  PLANNED INTERVENTIONS: Therapeutic exercises, Therapeutic activity, Neuromuscular re-education, Patient/Family education, Self Care, Joint mobilization, Joint manipulation, Stair training, Orthotic/Fit training, DME instructions, Aquatic Therapy, Dry Needling, Electrical stimulation, Cryotherapy, Moist heat, Taping, Ultrasound, Ionotophoresis 4mg /ml Dexamethasone, Manual therapy,  Vasopneumatic device, Traction, Spinal manipulation, Spinal mobilization,Balance training, Gait  training,   PLAN FOR NEXT SESSION:   lumbar, hip, knee mobility, strength for R knee, hips, standing strength.   Gait training- R foot crossing in front of L.    Tinnie Don, PT, DPT 9:30 AM  08/19/24

## 2024-08-22 ENCOUNTER — Encounter: Payer: Self-pay | Admitting: Physical Therapy

## 2024-08-22 ENCOUNTER — Ambulatory Visit: Admitting: Physical Therapy

## 2024-08-22 DIAGNOSIS — R2689 Other abnormalities of gait and mobility: Secondary | ICD-10-CM

## 2024-08-22 DIAGNOSIS — M6281 Muscle weakness (generalized): Secondary | ICD-10-CM | POA: Diagnosis not present

## 2024-08-22 NOTE — Therapy (Signed)
 - OUTPATIENT PHYSICAL THERAPY LOWER EXTREMITY TREATMENT   Patient Name: Savannah Morrow MRN: [id number] DOB:[birth year], 68 y.o., female Today's Date: 08/22/2024  END OF SESSION:  PT End of Session - 08/22/24 0846     Visit Number 5    Number of Visits 16    Date for Recertification  10/04/24    Authorization Type [employer] Medicare -  12  through 12/30    PT Start Time 0848    PT Stop Time 0930    PT Time Calculation (min) 42 min    Activity Tolerance Patient tolerated treatment well    Behavior During Therapy [facility] for tasks assessed/performed           Past Medical History:  Diagnosis Date   Allergy    Arthritis    knees   Cataract 2018   Bilateral surgery Jul/Aug 2020   Crohn's disease (HCC)    colon resection in 20. Dr. Avram unsure of diagnosis- no evidence on colonoscopy   Difficult intravenous access 07/13/2023   Heart murmur    Hx of adenomatous colonic polyps 03/17/2018   Hyperlipidemia    Hypertension    Multiple sclerosis    Neuromuscular disorder (HCC) 11/2021   Diagnosed with Late Onset Multiple Sclerosis   Uterine cancer (HCC) 1989   s/p hysterectomy   Past Surgical History:  Procedure Laterality Date   ABDOMINAL HYSTERECTOMY  1989   Total abdominal, uterine cancer   APPENDECTOMY     BREAST BIOPSY Left 03/2019   fatty tissue   COLON SURGERY  1978-79   COLONOSCOPY     EYE SURGERY  July & Aug 2020   Bilateral Cataracts removed   OOPHORECTOMY     x1   SPINE SURGERY  03/01/2021   Anterior Cervical Discectomy C5,6,7 with Cadeverous Bone Fusion   total hysterectomy     WISDOM TOOTH EXTRACTION     Patient Active Problem List   Diagnosis Date Noted   Difficult intravenous access 07/13/2023   Numbness 03/26/2022   Gait disturbance 03/26/2022   Multiple sclerosis 12/23/2021   High risk medication use 12/23/2021   Vitamin D  deficiency 12/23/2021   Occipital neuralgia of left side 12/23/2021   S/P cervical spinal fusion 12/23/2021   Spinal stenosis in  cervical region 12/18/2020   Osteopenia 05/21/2020   Hearing loss 11/11/2019   S/P total hysterectomy 04/14/2018   Hx of adenomatous colonic polyps 03/17/2018   Family history of cerebrovascular accident (CVA) in sister 10/06/2017   Hyperglycemia 04/07/2016   Crohn's disease (HCC)    Chest pain 03/30/2014   Morbid obesity (HCC) 03/07/2011   Osteoarthritis of both knees 01/10/2010   Edema 01/10/2010   Hyperlipidemia 06/02/2007   Essential hypertension 06/02/2007   Allergic rhinitis 06/02/2007     PCP: Garnette Lukes  REFERRING PROVIDER: Garnette Lukes  REFERRING DIAG: MS, muscular deconditioning   THERAPY DIAG:  Other abnormalities of gait and mobility  Muscle weakness (generalized)  Rationale for Evaluation and Treatment: Rehabilitation  ONSET DATE:     SUBJECTIVE:   SUBJECTIVE STATEMENT: Pt with no new complaints.   Eval: Pt Sees neurologist, states that she wants to improve balance. She was diagnosed with MS in 2023. States she doesn't feel as comfortable with her walking and balance.  In house, does not use cane, but feels like she needs to hold on to furniture, feels like she has lost strength overall, more in arms.  Uses small base quad cane (likes it) . Also has walking stick.  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 Well healed- mild pain with heavy lifting.  Lives with husband  Stairs: 1 step to enter with railing. (3 steps into RV in summer) Grab bars in bathroom. Walk in shower with seat.  Has some arm weights that she uses. [facility]. But does virtual, does a class every day.  Main complaint: walking   PERTINENT HISTORY: MS, ACDF, OA R knee,   PAIN:  Are you having pain? Yes: NPRS scale: 2-3/10 Pain location: R knee Pain description: sore, stiff, weak Aggravating factors: bending, squat Relieving factors: rest   PRECAUTIONS: Fall  WEIGHT BEARING RESTRICTIONS: No  FALLS:  Has patient fallen in last 6  months? No  PLOF: Independent  PATIENT GOALS:  Improve balance   NEXT MD VISIT:   OBJECTIVE:   DIAGNOSTIC FINDINGS:  R knee xray:   IMPRESSION: Severe degenerative joint disease of medial joint space. No acute abnormality seen in the right knee.  PATIENT SURVEYS:    COGNITION: Overall cognitive status: Within functional limits for tasks assessed     SENSATION: WFL  EDEMA:    POSTURE:     PALPATION:   LOWER EXTREMITY ROM: Hips: WFL Knees: mild limitation for full flexion/pain at end range.   LOWER EXTREMITY MMT:  MMT Left eval Right  eval  Hip flexion 4 4-  Hip extension    Hip abduction    Hip adduction    Hip internal rotation    Hip external rotation    Knee flexion 4+ 4  Knee extension 4+ 4  Ankle dorsiflexion    Ankle plantarflexion    Ankle inversion    Ankle eversion     (Blank rows = not tested)  LOWER EXTREMITY SPECIAL TESTS:    FUNCTIONAL TESTS:    5 time sit to stand:   19. 38 sec DGI:  11    GAIT: Distance walked: 150 ft Assistive device utilized: Quad cane small base Level of assistance: Modified independence Comments: slow, cautious gait,  OA in R knee, likes cane in R hand.    TODAY'S TREATMENT:                                                                                                                              DATE:   08/22/2024 Therapeutic Exercise: Aerobic: Supine: Seated:  LAQ 3lb ,  2 x 10 bil;   Standing:  hip abd 2 x 5 bil (pain in R knee with standing)   Marching x 20/slow ;  Stretches:  Neuromuscular Re-education: Toe taps on 6 in step x 15- no Ue support Toe taps/lunge onto 6 in step x 15 bil; - no UE support Weight shifts fwd/bwd - 1 foot on 6 in step x 10 bil- no Ue support Gait:   Gait: SPC,  45 ft x 6,  trial for cane in L hand -45 ft x 3 .  Manual Therapy: Therapeutic Activity: Sit to stand from (slightly higher )  mat table  x 10; Regular chair height:  x 10- no UE support   Step up 6 in no  UE support x 10 bil;  Self Care:    Therapeutic Exercise: Aerobic: Supine: Seated:  LAQ 3lb ,  2 x 10 bil;   Standing:  hip abd 2 x 5 bil (pain in R knee with standing)   Marching x 20/slow ;  Heel raises x 15;  Stretches:  Neuromuscular Re-education: Gait:   Pre-gait:  fwd stepping with visual tape markers x 20;  Step throughs on R x 20 with visual tape markers;    Gait: SPC,  45 ft x 8   Manual Therapy: Therapeutic Activity: Sit to stand from (slightly higher )  mat table 2 x 10;   Step up 4 in no UE support x 10 bil;   Stairs: 4in, reciprocol up/down x 4, no/light UE support Self Care:    Therapeutic Exercise: Aerobic: Supine: Seated:  LAQ 2 x 10 bil;  sit to stand 2 x 5;  Standing:  hip abd 2 x 10, Marching x 20/slow  Stretches:  Neuromuscular Re-education: Gait:  Education and practice for use of SPC, with sequencing. Pt using in R hand comfortably at this time.   45 ft x 2,  3 bouts  Manual Therapy: Therapeutic Activity: Self Care:   PATIENT EDUCATION:  Education details: Education on initial HEP Person educated: Patient Education method: Explanation, Demonstration, Actor cues, Verbal cues, and Handouts Education comprehension: verbalized understanding, returned demonstration, verbal cues required, tactile cues required, and needs further education   HOME EXERCISE PROGRAM: Access Code: [email or web address] URL: [email or web address] Date: 08/17/2024 Prepared by: Tinnie Don  Exercises - Seated Knee Extension AROM  - 1-2 x daily - 2 sets - 10 reps - Seated Ankle Pumps on Table  - 1 x daily - 2 sets - 10 reps - Standing March with Counter Support  - 1 x daily - 2 sets - 10 reps - Standing Hip Abduction with Counter Support  - 1 x daily - 1-2 sets - 5- 10 reps - Sit to Stand  - 1 x daily - 1-2 sets - 10 reps   ASSESSMENT:  CLINICAL IMPRESSION: Pt improving with ability for step height/toe taps and stairs. R knee instability does effect confidence  with standing activity. Pt to benefit from continued care   Eval: Patient presents with primary complaint of Balance and gait issues from MS. She has decreased dynamic balance with gait and DGI testing. She has decreased confidence and safety with functional activity. She will benefit from education on best use of AD for gait efficiency and safety.  Pt with decreased ability for full functional activities. Pt will  benefit from skilled PT to improve deficits and pain and to return to PLOF.   OBJECTIVE IMPAIRMENTS: Abnormal gait, decreased activity tolerance, decreased balance, decreased knowledge of use of DME, decreased mobility, difficulty walking, decreased strength, increased muscle spasms, impaired UE functional use, improper body mechanics, and pain.   ACTIVITY LIMITATIONS: carrying, lifting, bending, squatting, stairs, reach over head, and locomotion level  PARTICIPATION LIMITATIONS: meal prep, cleaning, laundry, shopping, community activity, and yard work  PERSONAL FACTORS: 1 comorbidity: MS are also affecting patient's functional outcome.   REHAB POTENTIAL: Good  CLINICAL DECISION MAKING: Stable/uncomplicated  EVALUATION COMPLEXITY: Low   GOALS: Goals reviewed with patient? Yes  SHORT TERM GOALS: Target date: 08/30/2024   Pt to be  independent with initial HEP  Goal status: INITIAL  2.  Pt to trial cane use in L hand, for improved support of R LE.   Goal status: INITIAL    LONG TERM GOALS: Target date: 10/04/2024   Pt to be independent with final HEP  Goal status: INITIAL  2.  Pt to demo improved score on DGI by at least 4 points.   Goal status: INITIAL  3.  Pt to demo optimal mechanics with ambulation with cane, for community distances, to improve safety and ability for activity   Goal status: INITIAL  4.  Pt to demo optimal mechanics and balance with bend, lift, squat, to improve balance with IADLs.   Goal status: INITIAL   PLAN:  PT FREQUENCY:  1-2x/week  PT DURATION: 8 weeks  PLANNED INTERVENTIONS: Therapeutic exercises, Therapeutic activity, Neuromuscular re-education, Patient/Family education, Self Care, Joint mobilization, Joint manipulation, Stair training, Orthotic/Fit training, DME instructions, Aquatic Therapy, Dry Needling, Electrical stimulation, Cryotherapy, Moist heat, Taping, Ultrasound, Ionotophoresis 4mg /ml Dexamethasone, Manual therapy,  Vasopneumatic device, Traction, Spinal manipulation, Spinal mobilization,Balance training, Gait training,   PLAN FOR NEXT SESSION:   lumbar, hip, knee mobility, strength for R knee, hips, standing strength.   Gait training- R foot crossing in front of L.    Tinnie Don, PT, DPT 9:30 AM  08/22/24

## 2024-08-24 ENCOUNTER — Ambulatory Visit: Admitting: Physical Therapy

## 2024-08-24 ENCOUNTER — Encounter: Payer: Self-pay | Admitting: Physical Therapy

## 2024-08-24 DIAGNOSIS — M6281 Muscle weakness (generalized): Secondary | ICD-10-CM | POA: Diagnosis not present

## 2024-08-24 DIAGNOSIS — R2689 Other abnormalities of gait and mobility: Secondary | ICD-10-CM | POA: Diagnosis not present

## 2024-08-24 NOTE — Therapy (Signed)
 - OUTPATIENT PHYSICAL THERAPY LOWER EXTREMITY TREATMENT   Patient Name: Savannah Morrow MRN: [id number] DOB:[birth year], 68 y.o., female Today's Date: 08/24/2024  END OF SESSION:  PT End of Session - 08/24/24 1300     Visit Number 6    Number of Visits 16    Date for Recertification  10/04/24    Authorization Type [employer] Medicare -  12  through 12/30    PT Start Time 1300    PT Stop Time 1345    PT Time Calculation (min) 45 min    Activity Tolerance Patient tolerated treatment well    Behavior During Therapy [facility] for tasks assessed/performed           Past Medical History:  Diagnosis Date   Allergy    Arthritis    knees   Cataract 2018   Bilateral surgery Jul/Aug 2020   Crohn's disease (HCC)    colon resection in 20. Dr. Avram unsure of diagnosis- no evidence on colonoscopy   Difficult intravenous access 07/13/2023   Heart murmur    Hx of adenomatous colonic polyps 03/17/2018   Hyperlipidemia    Hypertension    Multiple sclerosis    Neuromuscular disorder (HCC) 11/2021   Diagnosed with Late Onset Multiple Sclerosis   Uterine cancer (HCC) 1989   s/p hysterectomy   Past Surgical History:  Procedure Laterality Date   ABDOMINAL HYSTERECTOMY  1989   Total abdominal, uterine cancer   APPENDECTOMY     BREAST BIOPSY Left 03/2019   fatty tissue   COLON SURGERY  1978-79   COLONOSCOPY     EYE SURGERY  July & Aug 2020   Bilateral Cataracts removed   OOPHORECTOMY     x1   SPINE SURGERY  03/01/2021   Anterior Cervical Discectomy C5,6,7 with Cadeverous Bone Fusion   total hysterectomy     WISDOM TOOTH EXTRACTION     Patient Active Problem List   Diagnosis Date Noted   Difficult intravenous access 07/13/2023   Numbness 03/26/2022   Gait disturbance 03/26/2022   Multiple sclerosis 12/23/2021   High risk medication use 12/23/2021   Vitamin D  deficiency 12/23/2021   Occipital neuralgia of left side 12/23/2021   S/P cervical spinal fusion 12/23/2021   Spinal stenosis in  cervical region 12/18/2020   Osteopenia 05/21/2020   Hearing loss 11/11/2019   S/P total hysterectomy 04/14/2018   Hx of adenomatous colonic polyps 03/17/2018   Family history of cerebrovascular accident (CVA) in sister 10/06/2017   Hyperglycemia 04/07/2016   Crohn's disease (HCC)    Chest pain 03/30/2014   Morbid obesity (HCC) 03/07/2011   Osteoarthritis of both knees 01/10/2010   Edema 01/10/2010   Hyperlipidemia 06/02/2007   Essential hypertension 06/02/2007   Allergic rhinitis 06/02/2007     PCP: Garnette Lukes  REFERRING PROVIDER: Garnette Lukes  REFERRING DIAG: MS, muscular deconditioning   THERAPY DIAG:  Other abnormalities of gait and mobility  Muscle weakness (generalized)  Rationale for Evaluation and Treatment: Rehabilitation  ONSET DATE:     SUBJECTIVE:   SUBJECTIVE STATEMENT: Pt with no new complaints.   Eval: Pt Sees neurologist, states that she wants to improve balance. She was diagnosed with MS in 2023. States she doesn't feel as comfortable with her walking and balance.  In house, does not use cane, but feels like she needs to hold on to furniture, feels like she has lost strength overall, more in arms.  Uses small base quad cane (likes it) . Also has walking stick.  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 Well healed- mild pain with heavy lifting.  Lives with husband  Stairs: 1 step to enter with railing. (3 steps into RV in summer) Grab bars in bathroom. Walk in shower with seat.  Has some arm weights that she uses. [facility] at senior center- 2x/wk. But does virtual, does a class every day.  Main complaint: walking   PERTINENT HISTORY: MS, ACDF, OA R knee,   PAIN:  Are you having pain? Yes: NPRS scale: 2-3/10 Pain location: R knee Pain description: sore, stiff, weak Aggravating factors: bending, squat Relieving factors: rest   PRECAUTIONS: Fall  WEIGHT BEARING RESTRICTIONS: No  FALLS:  Has patient fallen in last 6  months? No  PLOF: Independent  PATIENT GOALS:  Improve balance   NEXT MD VISIT:   OBJECTIVE:   DIAGNOSTIC FINDINGS:  R knee xray:   IMPRESSION: Severe degenerative joint disease of medial joint space. No acute abnormality seen in the right knee.  PATIENT SURVEYS:    COGNITION: Overall cognitive status: Within functional limits for tasks assessed     SENSATION: WFL  EDEMA:    POSTURE:     PALPATION:   LOWER EXTREMITY ROM: Hips: WFL Knees: mild limitation for full flexion/pain at end range.   LOWER EXTREMITY MMT:  MMT Left eval Right  eval  Hip flexion 4 4-  Hip extension    Hip abduction    Hip adduction    Hip internal rotation    Hip external rotation    Knee flexion 4+ 4  Knee extension 4+ 4  Ankle dorsiflexion    Ankle plantarflexion    Ankle inversion    Ankle eversion     (Blank rows = not tested)  LOWER EXTREMITY SPECIAL TESTS:    FUNCTIONAL TESTS:    5 time sit to stand:   19. 38 sec DGI:  11    GAIT: Distance walked: 150 ft Assistive device utilized: Quad cane small base Level of assistance: Modified independence Comments: slow, cautious gait,  OA in R knee, likes cane in R hand.    TODAY'S TREATMENT:                                                                                                                              DATE:   08/24/2024 Therapeutic Exercise: Aerobic: Supine: Seated:  LAQ 3lb ,  2 x 10 bil;   S/L:  hip abd 2 x 8 bil;  Standing:   Marching x 15 fast and x 15/slow ;  Stretches:  Neuromuscular Re-education: Toe taps on 4 in  step x 20 ea bil- no Ue support Weight shifts fwd/bwd - 1 foot on 6 in step x 10 bil- no Ue support Step up 4 in and 6 in no UE support x 10 ea bil;  Gait:   Pre-gait:  fwd stepping with visual tape markers x 20;   Gait:  SPC,  170  ft x 3 laps with [facility]   Manual Therapy: Therapeutic Activity: Self Care:    Therapeutic Exercise: Aerobic: Supine: Seated:  LAQ 3lb ,  2 x 10  bil;   Standing:  hip abd 2 x 5 bil (pain in R knee with standing)   Marching x 20/slow ;  Stretches:  Neuromuscular Re-education: Toe taps on 6 in step x 15- no Ue support Toe taps/lunge onto 6 in step x 15 bil; - no UE support Weight shifts fwd/bwd - 1 foot on 6 in step x 10 bil- no Ue support Gait:   Gait: SPC,  45 ft x 6,  trial for cane in L hand -45 ft x 3 .  Manual Therapy: Therapeutic Activity: Sit to stand from (slightly higher )  mat table  x 10; Regular chair height:  x 10- no UE support   Step up 6 in no UE support x 10 bil;  Self Care:    Therapeutic Exercise: Aerobic: Supine: Seated:  LAQ 3lb ,  2 x 10 bil;   Standing:  hip abd 2 x 5 bil (pain in R knee with standing)   Marching x 20/slow ;  Heel raises x 15;  Stretches:  Neuromuscular Re-education: Gait:   Pre-gait:  fwd stepping with visual tape markers x 20;  Step throughs on R x 20 with visual tape markers;    Gait: SPC,  45 ft x 8   Manual Therapy: Therapeutic Activity: Sit to stand from (slightly higher )  mat table 2 x 10;   Step up 4 in no UE support x 10 bil;   Stairs: 4in, reciprocol up/down x 4, no/light UE support Self Care:    Therapeutic Exercise: Aerobic: Supine: Seated:  LAQ 2 x 10 bil;  sit to stand 2 x 5;  Standing:  hip abd 2 x 10, Marching x 20/slow  Stretches:  Neuromuscular Re-education: Gait:  Education and practice for use of SPC, with sequencing. Pt using in R hand comfortably at this time.   45 ft x 2,  3 bouts  Manual Therapy: Therapeutic Activity: Self Care:   PATIENT EDUCATION:  Education details: Education on initial HEP Person educated: Patient Education method: Explanation, Demonstration, Actor cues, Verbal cues, and Handouts Education comprehension: verbalized understanding, returned demonstration, verbal cues required, tactile cues required, and needs further education   HOME EXERCISE PROGRAM: Access Code: [email or web address] URL: [email or web address] Date:  08/17/2024 Prepared by: Tinnie Don  Exercises - Seated Knee Extension AROM  - 1-2 x daily - 2 sets - 10 reps - Seated Ankle Pumps on Table  - 1 x daily - 2 sets - 10 reps - Standing March with Counter Support  - 1 x daily - 2 sets - 10 reps - Standing Hip Abduction with Counter Support  - 1 x daily - 1-2 sets - 5- 10 reps - Sit to Stand  - 1 x daily - 1-2 sets - 10 reps   ASSESSMENT:  CLINICAL IMPRESSION: Pt improving gait mechanics, and improving confidence with step ups today. Challenged with hip abd, but less pain in s/l than standing for knee. Pt to benefit from continued care.   Eval: Patient presents with primary complaint of Balance and gait issues from MS. She has decreased dynamic balance with gait and DGI testing. She has decreased confidence and safety with functional activity. She will benefit from education on best use of AD for gait efficiency and safety.  Pt with decreased ability  for full functional activities. Pt will  benefit from skilled PT to improve deficits and pain and to return to PLOF.   OBJECTIVE IMPAIRMENTS: Abnormal gait, decreased activity tolerance, decreased balance, decreased knowledge of use of DME, decreased mobility, difficulty walking, decreased strength, increased muscle spasms, impaired UE functional use, improper body mechanics, and pain.   ACTIVITY LIMITATIONS: carrying, lifting, bending, squatting, stairs, reach over head, and locomotion level  PARTICIPATION LIMITATIONS: meal prep, cleaning, laundry, shopping, community activity, and yard work  PERSONAL FACTORS: 1 comorbidity: MS are also affecting patient's functional outcome.   REHAB POTENTIAL: Good  CLINICAL DECISION MAKING: Stable/uncomplicated  EVALUATION COMPLEXITY: Low   GOALS: Goals reviewed with patient? Yes  SHORT TERM GOALS: Target date: 08/30/2024   Pt to be independent with initial HEP  Goal status: MET  2.  Pt to trial cane use in L hand, for improved support of R  LE.   Goal status: MET    LONG TERM GOALS: Target date: 10/04/2024   Pt to be independent with final HEP  Goal status: INITIAL  2.  Pt to demo improved score on DGI by at least 4 points.   Goal status: INITIAL  3.  Pt to demo optimal mechanics with ambulation with cane, for community distances, to improve safety and ability for activity   Goal status: INITIAL  4.  Pt to demo optimal mechanics and balance with bend, lift, squat, to improve balance with IADLs.   Goal status: INITIAL   PLAN:  PT FREQUENCY: 1-2x/week  PT DURATION: 8 weeks  PLANNED INTERVENTIONS: Therapeutic exercises, Therapeutic activity, Neuromuscular re-education, Patient/Family education, Self Care, Joint mobilization, Joint manipulation, Stair training, Orthotic/Fit training, DME instructions, Aquatic Therapy, Dry Needling, Electrical stimulation, Cryotherapy, Moist heat, Taping, Ultrasound, Ionotophoresis 4mg /ml Dexamethasone, Manual therapy,  Vasopneumatic device, Traction, Spinal manipulation, Spinal mobilization,Balance training, Gait training,   PLAN FOR NEXT SESSION:    Continue hip abd (mechanics) and gait training, stairs, stepping.    Tinnie Don, PT, DPT 1:00 PM  08/24/24

## 2024-08-29 ENCOUNTER — Encounter: Payer: Self-pay | Admitting: Physical Therapy

## 2024-08-29 ENCOUNTER — Ambulatory Visit: Admitting: Physical Therapy

## 2024-08-29 DIAGNOSIS — R2689 Other abnormalities of gait and mobility: Secondary | ICD-10-CM | POA: Diagnosis not present

## 2024-08-29 DIAGNOSIS — M6281 Muscle weakness (generalized): Secondary | ICD-10-CM

## 2024-08-29 NOTE — Therapy (Signed)
 - OUTPATIENT PHYSICAL THERAPY LOWER EXTREMITY TREATMENT   Patient Name: Savannah Morrow MRN: [id number] DOB:1956/07/26, 68 y.o., female Today's Date: 08/29/2024  END OF SESSION:  PT End of Session - 08/29/24 0847     Visit Number 7    Number of Visits 16    Date for Recertification  10/04/24    Authorization Type [employer] Medicare -  12  through 12/30    PT Start Time 0848    PT Stop Time 0930    PT Time Calculation (min) 42 min    Activity Tolerance Patient tolerated treatment well    Behavior During Therapy [facility] for tasks assessed/performed           Past Medical History:  Diagnosis Date   Allergy    Arthritis    knees   Cataract 2018   Bilateral surgery Jul/Aug 2020   Crohn's disease (HCC)    colon resection in 20. Dr. Avram unsure of diagnosis- no evidence on colonoscopy   Difficult intravenous access 07/13/2023   Heart murmur    Hx of adenomatous colonic polyps 03/17/2018   Hyperlipidemia    Hypertension    Multiple sclerosis    Neuromuscular disorder (HCC) 11/2021   Diagnosed with Late Onset Multiple Sclerosis   Uterine cancer (HCC) 1989   s/p hysterectomy   Past Surgical History:  Procedure Laterality Date   ABDOMINAL HYSTERECTOMY  1989   Total abdominal, uterine cancer   APPENDECTOMY     BREAST BIOPSY Left 03/2019   fatty tissue   COLON SURGERY  1978-79   COLONOSCOPY     EYE SURGERY  July & Aug 2020   Bilateral Cataracts removed   OOPHORECTOMY     x1   SPINE SURGERY  03/01/2021   Anterior Cervical Discectomy C5,6,7 with Cadeverous Bone Fusion   total hysterectomy     WISDOM TOOTH EXTRACTION     Patient Active Problem List   Diagnosis Date Noted   Difficult intravenous access 07/13/2023   Numbness 03/26/2022   Gait disturbance 03/26/2022   Multiple sclerosis 12/23/2021   High risk medication use 12/23/2021   Vitamin D  deficiency 12/23/2021   Occipital neuralgia of left side 12/23/2021   S/P cervical spinal fusion 12/23/2021   Spinal stenosis in  cervical region 12/18/2020   Osteopenia 05/21/2020   Hearing loss 11/11/2019   S/P total hysterectomy 04/14/2018   Hx of adenomatous colonic polyps 03/17/2018   Family history of cerebrovascular accident (CVA) in sister 10/06/2017   Hyperglycemia 04/07/2016   Crohn's disease (HCC)    Chest pain 03/30/2014   Morbid obesity (HCC) 03/07/2011   Osteoarthritis of both knees 01/10/2010   Edema 01/10/2010   Hyperlipidemia 06/02/2007   Essential hypertension 06/02/2007   Allergic rhinitis 06/02/2007     PCP: Garnette Lukes  REFERRING PROVIDER: Garnette Lukes  REFERRING DIAG: MS, muscular deconditioning   THERAPY DIAG:  Other abnormalities of gait and mobility  Muscle weakness (generalized)  Rationale for Evaluation and Treatment: Rehabilitation  ONSET DATE:     SUBJECTIVE:   SUBJECTIVE STATEMENT: Pt with no new complaints.   Eval: Pt Sees neurologist, states that she wants to improve balance. She was diagnosed with MS in 2023. States she doesn't feel as comfortable with her walking and balance.  In house, does not use cane, but feels like she needs to hold on to furniture, feels like she has lost strength overall, more in arms.  Uses small base quad cane (likes it) . Also has walking stick.  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 Well healed- mild pain with heavy lifting.  Lives with husband  Stairs: 1 step to enter with railing. (3 steps into RV in summer) Grab bars in bathroom. Walk in shower with seat.  Has some arm weights that she uses. [facility]. But does virtual, does a class every day.  Main complaint: walking   PERTINENT HISTORY: MS, ACDF, OA R knee,   PAIN:  Are you having pain? Yes: NPRS scale: 2-3/10 Pain location: R knee Pain description: sore, stiff, weak Aggravating factors: bending, squat Relieving factors: rest   PRECAUTIONS: Fall  WEIGHT BEARING RESTRICTIONS: No  FALLS:  Has patient fallen in last 6  months? No  PLOF: Independent  PATIENT GOALS:  Improve balance   NEXT MD VISIT:   OBJECTIVE:   DIAGNOSTIC FINDINGS:  R knee xray:   IMPRESSION: Severe degenerative joint disease of medial joint space. No acute abnormality seen in the right knee.  PATIENT SURVEYS:    COGNITION: Overall cognitive status: Within functional limits for tasks assessed     SENSATION: WFL  EDEMA:    POSTURE:     PALPATION:   LOWER EXTREMITY ROM: Hips: WFL Knees: mild limitation for full flexion/pain at end range.   LOWER EXTREMITY MMT:  MMT Left eval Right  eval  Hip flexion 4 4-  Hip extension    Hip abduction    Hip adduction    Hip internal rotation    Hip external rotation    Knee flexion 4+ 4  Knee extension 4+ 4  Ankle dorsiflexion    Ankle plantarflexion    Ankle inversion    Ankle eversion     (Blank rows = not tested)  LOWER EXTREMITY SPECIAL TESTS:    FUNCTIONAL TESTS:    5 time sit to stand:   19. 38 sec DGI:  11    GAIT: Distance walked: 150 ft Assistive device utilized: Quad cane small base Level of assistance: Modified independence Comments: slow, cautious gait,  OA in R knee, likes cane in R hand.    TODAY'S TREATMENT:                                                                                                                              DATE:   08/29/2024 Therapeutic Exercise: Aerobic: Supine:  SLR x 12 bil;  Seated:   S/L:  hip abd 2 x  bil;  Standing:   Marching x 20 slow;  Stretches:  Neuromuscular Re-education: Tandem stance 20 sec x 3 bil;  Toe taps on 6 in  step x 20 ea bil- no Ue support Stairs 4 in and 6 in , no UE support x 10 ea bil;  Descending - practice for step - to pattern x 4x leading with R- for knee pain  Gait:   Gait: SPC,  170  ft x 3 laps with [facility]  Manual Therapy: Therapeutic Activity: Self Care:   Therapeutic Exercise: Aerobic: Supine: Seated:  LAQ 3lb ,  2 x 10 bil;   S/L:  hip abd 2 x 8 bil;   Standing:   Marching x 15 fast and x 15/slow ;  Stretches:  Neuromuscular Re-education: Toe taps on 4 in  step x 20 ea bil- no Ue support Weight shifts fwd/bwd - 1 foot on 6 in step x 10 bil- no Ue support Step up 4 in and 6 in no UE support x 10 ea bil;  Gait:   Pre-gait:  fwd stepping with visual tape markers x 20;   Gait: SPC,  170  ft x 3 laps with [facility]   Manual Therapy: Therapeutic Activity: Self Care:    Therapeutic Exercise: Aerobic: Supine: Seated:  LAQ 3lb ,  2 x 10 bil;   Standing:  hip abd 2 x 5 bil (pain in R knee with standing)   Marching x 20/slow ;  Stretches:  Neuromuscular Re-education: Toe taps on 6 in step x 15- no Ue support Toe taps/lunge onto 6 in step x 15 bil; - no UE support Weight shifts fwd/bwd - 1 foot on 6 in step x 10 bil- no Ue support Gait:   Gait: SPC,  45 ft x 6,  trial for cane in L hand -45 ft x 3 .  Manual Therapy: Therapeutic Activity: Sit to stand from (slightly higher )  mat table  x 10; Regular chair height:  x 10- no UE support   Step up 6 in no UE support x 10 bil;  Self Care:   PATIENT EDUCATION:  Education details: updated and reviewed HEP Person educated: Patient Education method: Explanation, Demonstration, Tactile cues, Verbal cues, and Handouts Education comprehension: verbalized understanding, returned demonstration, verbal cues required, tactile cues required, and needs further education   HOME EXERCISE PROGRAM: Access Code: [email or web address] URL: [email or web address] Date: 08/17/2024 Prepared by: Tinnie Don  Exercises - Seated Knee Extension AROM  - 1-2 x daily - 2 sets - 10 reps - Seated Ankle Pumps on Table  - 1 x daily - 2 sets - 10 reps - Standing March with Counter Support  - 1 x daily - 2 sets - 10 reps - Standing Hip Abduction with Counter Support  - 1 x daily - 1-2 sets - 5- 10 reps - Sit to Stand  - 1 x daily - 1-2 sets - 10 reps   ASSESSMENT:  CLINICAL IMPRESSION: Pt with improving gait  efficiency and mechanics. Plan to continue dynamic walking next visit with stepping up over objects for foot clearance. Reviewed hip strength today, and importance of continuing for HEP.   Eval: Patient presents with primary complaint of Balance and gait issues from MS. She has decreased dynamic balance with gait and DGI testing. She has decreased confidence and safety with functional activity. She will benefit from education on best use of AD for gait efficiency and safety.  Pt with decreased ability for full functional activities. Pt will  benefit from skilled PT to improve deficits and pain and to return to PLOF.   OBJECTIVE IMPAIRMENTS: Abnormal gait, decreased activity tolerance, decreased balance, decreased knowledge of use of DME, decreased mobility, difficulty walking, decreased strength, increased muscle spasms, impaired UE functional use, improper body mechanics, and pain.   ACTIVITY LIMITATIONS: carrying, lifting, bending, squatting, stairs, reach over head, and locomotion level  PARTICIPATION LIMITATIONS: meal prep, cleaning, laundry, shopping, community activity, and yard work  PERSONAL FACTORS: 1  comorbidity: MS are also affecting patient's functional outcome.   REHAB POTENTIAL: Good  CLINICAL DECISION MAKING: Stable/uncomplicated  EVALUATION COMPLEXITY: Low   GOALS: Goals reviewed with patient? Yes  SHORT TERM GOALS: Target date: 08/30/2024   Pt to be independent with initial HEP  Goal status: MET  2.  Pt to trial cane use in L hand, for improved support of R LE.   Goal status: MET    LONG TERM GOALS: Target date: 10/04/2024   Pt to be independent with final HEP  Goal status: INITIAL  2.  Pt to demo improved score on DGI by at least 4 points.   Goal status: INITIAL  3.  Pt to demo optimal mechanics with ambulation with cane, for community distances, to improve safety and ability for activity   Goal status: INITIAL  4.  Pt to demo optimal mechanics and  balance with bend, lift, squat, to improve balance with IADLs.   Goal status: INITIAL   PLAN:  PT FREQUENCY: 1-2x/week  PT DURATION: 8 weeks  PLANNED INTERVENTIONS: Therapeutic exercises, Therapeutic activity, Neuromuscular re-education, Patient/Family education, Self Care, Joint mobilization, Joint manipulation, Stair training, Orthotic/Fit training, DME instructions, Aquatic Therapy, Dry Needling, Electrical stimulation, Cryotherapy, Moist heat, Taping, Ultrasound, Ionotophoresis 4mg /ml Dexamethasone, Manual therapy,  Vasopneumatic device, Traction, Spinal manipulation, Spinal mobilization,Balance training, Gait training,   PLAN FOR NEXT SESSION:    Walking without cane, DGI/  stepping over cones.    Tinnie Don, PT, DPT 12:50 PM  08/29/24

## 2024-08-31 ENCOUNTER — Encounter: Payer: Self-pay | Admitting: Physical Therapy

## 2024-08-31 ENCOUNTER — Ambulatory Visit: Admitting: Physical Therapy

## 2024-08-31 DIAGNOSIS — R2689 Other abnormalities of gait and mobility: Secondary | ICD-10-CM

## 2024-08-31 DIAGNOSIS — M6281 Muscle weakness (generalized): Secondary | ICD-10-CM

## 2024-08-31 NOTE — Therapy (Signed)
 - OUTPATIENT PHYSICAL THERAPY LOWER EXTREMITY TREATMENT   Patient Name: Savannah Morrow MRN: [id number] DOB:10/14/1956, 68 y.o., female Today's Date: 08/31/2024  END OF SESSION:  PT End of Session - 08/31/24 1126     Visit Number 8    Number of Visits 16    Date for Recertification  10/04/24    Authorization Type [employer] Medicare -  12  through 12/30    PT Start Time 0850    PT Stop Time 0930    PT Time Calculation (min) 40 min    Activity Tolerance Patient tolerated treatment well    Behavior During Therapy [facility] for tasks assessed/performed            Past Medical History:  Diagnosis Date   Allergy    Arthritis    knees   Cataract 2018   Bilateral surgery Jul/Aug 2020   Crohn's disease (HCC)    colon resection in 20. Dr. Avram unsure of diagnosis- no evidence on colonoscopy   Difficult intravenous access 07/13/2023   Heart murmur    Hx of adenomatous colonic polyps 03/17/2018   Hyperlipidemia    Hypertension    Multiple sclerosis    Neuromuscular disorder (HCC) 11/2021   Diagnosed with Late Onset Multiple Sclerosis   Uterine cancer (HCC) 1989   s/p hysterectomy   Past Surgical History:  Procedure Laterality Date   ABDOMINAL HYSTERECTOMY  1989   Total abdominal, uterine cancer   APPENDECTOMY     BREAST BIOPSY Left 03/2019   fatty tissue   COLON SURGERY  1978-79   COLONOSCOPY     EYE SURGERY  July & Aug 2020   Bilateral Cataracts removed   OOPHORECTOMY     x1   SPINE SURGERY  03/01/2021   Anterior Cervical Discectomy C5,6,7 with Cadeverous Bone Fusion   total hysterectomy     WISDOM TOOTH EXTRACTION     Patient Active Problem List   Diagnosis Date Noted   Difficult intravenous access 07/13/2023   Numbness 03/26/2022   Gait disturbance 03/26/2022   Multiple sclerosis 12/23/2021   High risk medication use 12/23/2021   Vitamin D  deficiency 12/23/2021   Occipital neuralgia of left side 12/23/2021   S/P cervical spinal fusion 12/23/2021   Spinal stenosis in  cervical region 12/18/2020   Osteopenia 05/21/2020   Hearing loss 11/11/2019   S/P total hysterectomy 04/14/2018   Hx of adenomatous colonic polyps 03/17/2018   Family history of cerebrovascular accident (CVA) in sister 10/06/2017   Hyperglycemia 04/07/2016   Crohn's disease (HCC)    Chest pain 03/30/2014   Morbid obesity (HCC) 03/07/2011   Osteoarthritis of both knees 01/10/2010   Edema 01/10/2010   Hyperlipidemia 06/02/2007   Essential hypertension 06/02/2007   Allergic rhinitis 06/02/2007     PCP: Garnette Lukes  REFERRING PROVIDER: Garnette Lukes  REFERRING DIAG: MS, muscular deconditioning   THERAPY DIAG:  Other abnormalities of gait and mobility  Muscle weakness (generalized)  Rationale for Evaluation and Treatment: Rehabilitation  ONSET DATE:     SUBJECTIVE:   SUBJECTIVE STATEMENT: Pt with no new complaints.   Eval: Pt Sees neurologist, states that she wants to improve balance. She was diagnosed with MS in 2023. States she doesn't feel as comfortable with her walking and balance.  In house, does not use cane, but feels like she needs to hold on to furniture, feels like she has lost strength overall, more in arms.  Uses small base quad cane (likes it) . Also has walking  stick.  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 Well healed- mild pain with heavy lifting.  Lives with husband  Stairs: 1 step to enter with railing. (3 steps into RV in summer) Grab bars in bathroom. Walk in shower with seat.  Has some arm weights that she uses. [facility] at senior center- 2x/wk. But does virtual, does a class every day.  Main complaint: walking   PERTINENT HISTORY: MS, ACDF, OA R knee,   PAIN:  Are you having pain? Yes: NPRS scale: 2-3/10 Pain location: R knee Pain description: sore, stiff, weak Aggravating factors: bending, squat Relieving factors: rest   PRECAUTIONS: Fall  WEIGHT BEARING RESTRICTIONS: No  FALLS:  Has patient fallen in last 6  months? No  PLOF: Independent  PATIENT GOALS:  Improve balance   NEXT MD VISIT:   OBJECTIVE:   DIAGNOSTIC FINDINGS:  R knee xray:   IMPRESSION: Severe degenerative joint disease of medial joint space. No acute abnormality seen in the right knee.  PATIENT SURVEYS:    COGNITION: Overall cognitive status: Within functional limits for tasks assessed     SENSATION: WFL  EDEMA:    POSTURE:     PALPATION:   LOWER EXTREMITY ROM: Hips: WFL Knees: mild limitation for full flexion/pain at end range.   LOWER EXTREMITY MMT:  MMT Left eval Right  eval  Hip flexion 4 4-  Hip extension    Hip abduction    Hip adduction    Hip internal rotation    Hip external rotation    Knee flexion 4+ 4  Knee extension 4+ 4  Ankle dorsiflexion    Ankle plantarflexion    Ankle inversion    Ankle eversion     (Blank rows = not tested)  LOWER EXTREMITY SPECIAL TESTS:    FUNCTIONAL TESTS:    5 time sit to stand:   19. 38 sec DGI:  11    GAIT: Distance walked: 150 ft Assistive device utilized: Quad cane small base Level of assistance: Modified independence Comments: slow, cautious gait,  OA in R knee, likes cane in R hand.    TODAY'S TREATMENT:                                                                                                                              DATE:   08/31/2024 Therapeutic Exercise: Aerobic: Supine:  SLR 2 x 10 bil;  Seated:  sit to stand x 10- mat table- no hands  S/L:    hip abd 12  x  bil;  Standing:   Marching x 20 slow;  Stretches:  Neuromuscular Re-education: Side stepping 25 ft x 6,   Bwd walking 20 ft x 6 Stepping up over low height x 4 bouts- with and without SPC  Stepping up over hurdles- with SPC  x 4 bouts  Gait:   Gait: no AD 170  ft x 2  laps  Manual  Therapy: Therapeutic Activity: Self Care:    Therapeutic Exercise: Aerobic: Supine:  SLR x 12 bil;  Seated:   S/L:  hip abd 2 x  bil;  Standing:   Marching x 20 slow;   Stretches:  Neuromuscular Re-education: Tandem stance 20 sec x 3 bil;  Toe taps on 6 in  step x 20 ea bil- no Ue support Stairs 4 in and 6 in , no UE support x 10 ea bil;  Descending - practice for step - to pattern x 4x leading with R- for knee pain  Gait:   Gait: SPC,  170  ft x 3 laps with [facility]   Manual Therapy: Therapeutic Activity: Self Care:   Therapeutic Exercise: Aerobic: Supine: Seated:  LAQ 3lb ,  2 x 10 bil;   S/L:  hip abd 2 x 8 bil;  Standing:   Marching x 15 fast and x 15/slow ;  Stretches:  Neuromuscular Re-education: Toe taps on 4 in  step x 20 ea bil- no Ue support Weight shifts fwd/bwd - 1 foot on 6 in step x 10 bil- no Ue support Step up 4 in and 6 in no UE support x 10 ea bil;  Gait:   Pre-gait:  fwd stepping with visual tape markers x 20;   Gait: SPC,  170  ft x 3 laps with [facility]   Manual Therapy: Therapeutic Activity: Self Care:    Therapeutic Exercise: Aerobic: Supine: Seated:  LAQ 3lb ,  2 x 10 bil;   Standing:  hip abd 2 x 5 bil (pain in R knee with standing)   Marching x 20/slow ;  Stretches:  Neuromuscular Re-education: Toe taps on 6 in step x 15- no Ue support Toe taps/lunge onto 6 in step x 15 bil; - no UE support Weight shifts fwd/bwd - 1 foot on 6 in step x 10 bil- no Ue support Gait:   Gait: SPC,  45 ft x 6,  trial for cane in L hand -45 ft x 3 .  Manual Therapy: Therapeutic Activity: Sit to stand from (slightly higher )  mat table  x 10; Regular chair height:  x 10- no UE support   Step up 6 in no UE support x 10 bil;  Self Care:   PATIENT EDUCATION:  Education details: updated and reviewed HEP Person educated: Patient Education method: Explanation, Demonstration, Tactile cues, Verbal cues, and Handouts Education comprehension: verbalized understanding, returned demonstration, verbal cues required, tactile cues required, and needs further education   HOME EXERCISE PROGRAM: Access Code: [email or web address] URL:  [email or web address] Date: 08/17/2024 Prepared by: Tinnie Don  Exercises - Seated Knee Extension AROM  - 1-2 x daily - 2 sets - 10 reps - Seated Ankle Pumps on Table  - 1 x daily - 2 sets - 10 reps - Standing March with Counter Support  - 1 x daily - 2 sets - 10 reps - Standing Hip Abduction with Counter Support  - 1 x daily - 1-2 sets - 5- 10 reps - Sit to Stand  - 1 x daily - 1-2 sets - 10 reps   ASSESSMENT:  CLINICAL IMPRESSION: Pt with improving gait efficiency and mechanics. She was able to ambulate without SPC with improved gait mechanics today. She is challenged with dynamic gait, but able to do without SPC as well. Pt progressing well.   Eval: Patient presents with primary complaint of Balance and gait issues from MS. She has decreased dynamic balance with gait and DGI testing. She  has decreased confidence and safety with functional activity. She will benefit from education on best use of AD for gait efficiency and safety.  Pt with decreased ability for full functional activities. Pt will  benefit from skilled PT to improve deficits and pain and to return to PLOF.   OBJECTIVE IMPAIRMENTS: Abnormal gait, decreased activity tolerance, decreased balance, decreased knowledge of use of DME, decreased mobility, difficulty walking, decreased strength, increased muscle spasms, impaired UE functional use, improper body mechanics, and pain.   ACTIVITY LIMITATIONS: carrying, lifting, bending, squatting, stairs, reach over head, and locomotion level  PARTICIPATION LIMITATIONS: meal prep, cleaning, laundry, shopping, community activity, and yard work  PERSONAL FACTORS: 1 comorbidity: MS are also affecting patient's functional outcome.   REHAB POTENTIAL: Good  CLINICAL DECISION MAKING: Stable/uncomplicated  EVALUATION COMPLEXITY: Low   GOALS: Goals reviewed with patient? Yes  SHORT TERM GOALS: Target date: 08/30/2024   Pt to be independent with initial HEP  Goal  status: MET  2.  Pt to trial cane use in L hand, for improved support of R LE.   Goal status: MET    LONG TERM GOALS: Target date: 10/04/2024   Pt to be independent with final HEP  Goal status: INITIAL  2.  Pt to demo improved score on DGI by at least 4 points.   Goal status: INITIAL  3.  Pt to demo optimal mechanics with ambulation with cane, for community distances, to improve safety and ability for activity   Goal status: INITIAL  4.  Pt to demo optimal mechanics and balance with bend, lift, squat, to improve balance with IADLs.   Goal status: INITIAL   PLAN:  PT FREQUENCY: 1-2x/week  PT DURATION: 8 weeks  PLANNED INTERVENTIONS: Therapeutic exercises, Therapeutic activity, Neuromuscular re-education, Patient/Family education, Self Care, Joint mobilization, Joint manipulation, Stair training, Orthotic/Fit training, DME instructions, Aquatic Therapy, Dry Needling, Electrical stimulation, Cryotherapy, Moist heat, Taping, Ultrasound, Ionotophoresis 4mg /ml Dexamethasone, Manual therapy,  Vasopneumatic device, Traction, Spinal manipulation, Spinal mobilization,Balance training, Gait training,   PLAN FOR NEXT SESSION:    Walking without cane,  stepping over cones.    Tinnie Don, PT, DPT 11:27 AM  08/31/24

## 2024-09-05 ENCOUNTER — Ambulatory Visit: Admitting: Physical Therapy

## 2024-09-05 ENCOUNTER — Encounter: Payer: Self-pay | Admitting: Physical Therapy

## 2024-09-05 DIAGNOSIS — M6281 Muscle weakness (generalized): Secondary | ICD-10-CM | POA: Diagnosis not present

## 2024-09-05 DIAGNOSIS — R2689 Other abnormalities of gait and mobility: Secondary | ICD-10-CM | POA: Diagnosis not present

## 2024-09-05 NOTE — Therapy (Signed)
 - OUTPATIENT PHYSICAL THERAPY LOWER EXTREMITY TREATMENT   Patient Name: Savannah Morrow MRN: [id number] DOB:18-Feb-1956, 68 y.o., female Today's Date: 09/05/2024  END OF SESSION:  PT End of Session - 09/05/24 0844     Visit Number 9    Number of Visits 16    Date for Recertification  10/04/24    Authorization Type [employer] Medicare -  12  through 12/30    PT Start Time 0803    PT Stop Time 0846    PT Time Calculation (min) 43 min    Activity Tolerance Patient tolerated treatment well    Behavior During Therapy [facility] for tasks assessed/performed             Past Medical History:  Diagnosis Date   Allergy    Arthritis    knees   Cataract 2018   Bilateral surgery Jul/Aug 2020   Crohn's disease (HCC)    colon resection in 20. Dr. Avram unsure of diagnosis- no evidence on colonoscopy   Difficult intravenous access 07/13/2023   Heart murmur    Hx of adenomatous colonic polyps 03/17/2018   Hyperlipidemia    Hypertension    Multiple sclerosis    Neuromuscular disorder (HCC) 11/2021   Diagnosed with Late Onset Multiple Sclerosis   Uterine cancer (HCC) 1989   s/p hysterectomy   Past Surgical History:  Procedure Laterality Date   ABDOMINAL HYSTERECTOMY  1989   Total abdominal, uterine cancer   APPENDECTOMY     BREAST BIOPSY Left 03/2019   fatty tissue   COLON SURGERY  1978-79   COLONOSCOPY     EYE SURGERY  July & Aug 2020   Bilateral Cataracts removed   OOPHORECTOMY     x1   SPINE SURGERY  03/01/2021   Anterior Cervical Discectomy C5,6,7 with Cadeverous Bone Fusion   total hysterectomy     WISDOM TOOTH EXTRACTION     Patient Active Problem List   Diagnosis Date Noted   Difficult intravenous access 07/13/2023   Numbness 03/26/2022   Gait disturbance 03/26/2022   Multiple sclerosis 12/23/2021   High risk medication use 12/23/2021   Vitamin D  deficiency 12/23/2021   Occipital neuralgia of left side 12/23/2021   S/P cervical spinal fusion 12/23/2021   Spinal stenosis  in cervical region 12/18/2020   Osteopenia 05/21/2020   Hearing loss 11/11/2019   S/P total hysterectomy 04/14/2018   Hx of adenomatous colonic polyps 03/17/2018   Family history of cerebrovascular accident (CVA) in sister 10/06/2017   Hyperglycemia 04/07/2016   Crohn's disease (HCC)    Chest pain 03/30/2014   Morbid obesity (HCC) 03/07/2011   Osteoarthritis of both knees 01/10/2010   Edema 01/10/2010   Hyperlipidemia 06/02/2007   Essential hypertension 06/02/2007   Allergic rhinitis 06/02/2007     PCP: Garnette Lukes  REFERRING PROVIDER: Garnette Lukes  REFERRING DIAG: MS, muscular deconditioning   THERAPY DIAG:  Other abnormalities of gait and mobility  Muscle weakness (generalized)  Rationale for Evaluation and Treatment: Rehabilitation  ONSET DATE:     SUBJECTIVE:   SUBJECTIVE STATEMENT: Pt with no new complaints.   Eval: Pt Sees neurologist, states that she wants to improve balance. She was diagnosed with MS in 2023. States she doesn't feel as comfortable with her walking and balance.  In house, does not use cane, but feels like she needs to hold on to furniture, feels like she has lost strength overall, more in arms.  Uses small base quad cane (likes it) . Also has  walking stick.  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 Well healed- mild pain with heavy lifting.  Lives with husband  Stairs: 1 step to enter with railing. (3 steps into RV in summer) Grab bars in bathroom. Walk in shower with seat.  Has some arm weights that she uses. [facility]- [facility]. But does virtual, does a class every day.  Main complaint: walking   PERTINENT HISTORY: MS, ACDF, OA R knee,   PAIN:  Are you having pain? Yes: NPRS scale: 2-3/10 Pain location: R knee Pain description: sore, stiff, weak Aggravating factors: bending, squat Relieving factors: rest   PRECAUTIONS: Fall  WEIGHT BEARING RESTRICTIONS: No  FALLS:  Has patient fallen in last  6 months? No  PLOF: Independent  PATIENT GOALS:  Improve balance   NEXT MD VISIT:   OBJECTIVE:   DIAGNOSTIC FINDINGS:  R knee xray:   IMPRESSION: Severe degenerative joint disease of medial joint space. No acute abnormality seen in the right knee.  PATIENT SURVEYS:    COGNITION: Overall cognitive status: Within functional limits for tasks assessed     SENSATION: WFL  EDEMA:    POSTURE:     PALPATION:   LOWER EXTREMITY ROM: Hips: WFL Knees: mild limitation for full flexion/pain at end range.   LOWER EXTREMITY MMT:  MMT Left eval Right  eval  Hip flexion 4 4-  Hip extension    Hip abduction    Hip adduction    Hip internal rotation    Hip external rotation    Knee flexion 4+ 4  Knee extension 4+ 4  Ankle dorsiflexion    Ankle plantarflexion    Ankle inversion    Ankle eversion     (Blank rows = not tested)  LOWER EXTREMITY SPECIAL TESTS:    FUNCTIONAL TESTS:    5 time sit to stand:   19. 38 sec DGI:  11    GAIT: Distance walked: 150 ft Assistive device utilized: Quad cane small base Level of assistance: Modified independence Comments: slow, cautious gait,  OA in R knee, likes cane in R hand.    TODAY'S TREATMENT:                                                                                                                              DATE:   09/05/2024 Therapeutic Exercise: Aerobic: Supine:   Seated:  S/L:    Standing:   Marching x 20 slow;  Hip abd 2 x 10 bil; HR x 15  Stretches:  Neuromuscular Re-education: Side stepping 25 ft x 6,  Stepping up over cones - without SPC  x 4 bouts  Gait:   Gait: no AD : 525 ft cueing for arm swing   Manual Therapy: Therapeutic Activity: Step ups 6 in x 10 bil, no UE support Stairs: up/down 6 in steps-  5 stairs without railing with cane x 6,  Without cane  x 6    sit to stand x 12- mat table- no hands  Self Care:    Therapeutic Exercise: Aerobic: Supine:  SLR x 12 bil;  Seated:    S/L:  hip abd 2 x  bil;  Standing:   Marching x 20 slow;  Stretches:  Neuromuscular Re-education: Tandem stance 20 sec x 3 bil;  Toe taps on 6 in  step x 20 ea bil- no Ue support Stairs 4 in and 6 in , no UE support x 10 ea bil;  Descending - practice for step - to pattern x 4x leading with R- for knee pain  Gait:   Gait: SPC,  170  ft x 3 laps with [facility]   Manual Therapy: Therapeutic Activity: Self Care:   Therapeutic Exercise: Aerobic: Supine: Seated:  LAQ 3lb ,  2 x 10 bil;   S/L:  hip abd 2 x 8 bil;  Standing:   Marching x 15 fast and x 15/slow ;  Stretches:  Neuromuscular Re-education: Toe taps on 4 in  step x 20 ea bil- no Ue support Weight shifts fwd/bwd - 1 foot on 6 in step x 10 bil- no Ue support Step up 4 in and 6 in no UE support x 10 ea bil;  Gait:   Pre-gait:  fwd stepping with visual tape markers x 20;   Gait: SPC,  170  ft x 3 laps with [facility]   Manual Therapy: Therapeutic Activity: Self Care:    Therapeutic Exercise: Aerobic: Supine: Seated:  LAQ 3lb ,  2 x 10 bil;   Standing:  hip abd 2 x 5 bil (pain in R knee with standing)   Marching x 20/slow ;  Stretches:  Neuromuscular Re-education: Toe taps on 6 in step x 15- no Ue support Toe taps/lunge onto 6 in step x 15 bil; - no UE support Weight shifts fwd/bwd - 1 foot on 6 in step x 10 bil- no Ue support Gait:   Gait: SPC,  45 ft x 6,  trial for cane in L hand -45 ft x 3 .  Manual Therapy: Therapeutic Activity: Sit to stand from (slightly higher )  mat table  x 10; Regular chair height:  x 10- no UE support   Step up 6 in no UE support x 10 bil;   Stairs: up/down 5 steps without hand rail x5, step to onthe  Self Care:   PATIENT EDUCATION:  Education details: updated and reviewed HEP Person educated: Patient Education method: Explanation, Demonstration, Tactile cues, Verbal cues, and Handouts Education comprehension: verbalized understanding, returned demonstration, verbal cues required, tactile cues  required, and needs further education   HOME EXERCISE PROGRAM: Access Code: [email or web address] URL: [email or web address] Date: 08/17/2024 Prepared by: Tinnie Don  Exercises - Seated Knee Extension AROM  - 1-2 x daily - 2 sets - 10 reps - Seated Ankle Pumps on Table  - 1 x daily - 2 sets - 10 reps - Standing March with Counter Support  - 1 x daily - 2 sets - 10 reps - Standing Hip Abduction with Counter Support  - 1 x daily - 1-2 sets - 5- 10 reps - Sit to Stand  - 1 x daily - 1-2 sets - 10 reps   ASSESSMENT:  CLINICAL IMPRESSION: Pt with improving gait efficiency and mechanics. She has been able to ambulate without SPC with improved gait pattern. Cueing for arm swing.  She also has improving ability on stairs and stepping over objects today, without  SPC. She is still challenged on stairs with stafey and stability and will benefit from continued care.   Eval: Patient presents with primary complaint of Balance and gait issues from MS. She has decreased dynamic balance with gait and DGI testing. She has decreased confidence and safety with functional activity. She will benefit from education on best use of AD for gait efficiency and safety.  Pt with decreased ability for full functional activities. Pt will  benefit from skilled PT to improve deficits and pain and to return to PLOF.   OBJECTIVE IMPAIRMENTS: Abnormal gait, decreased activity tolerance, decreased balance, decreased knowledge of use of DME, decreased mobility, difficulty walking, decreased strength, increased muscle spasms, impaired UE functional use, improper body mechanics, and pain.   ACTIVITY LIMITATIONS: carrying, lifting, bending, squatting, stairs, reach over head, and locomotion level  PARTICIPATION LIMITATIONS: meal prep, cleaning, laundry, shopping, community activity, and yard work  PERSONAL FACTORS: 1 comorbidity: MS are also affecting patient's functional outcome.   REHAB POTENTIAL: Good  CLINICAL  DECISION MAKING: Stable/uncomplicated  EVALUATION COMPLEXITY: Low   GOALS: Goals reviewed with patient? Yes  SHORT TERM GOALS: Target date: 08/30/2024   Pt to be independent with initial HEP  Goal status: MET  2.  Pt to trial cane use in L hand, for improved support of R LE.   Goal status: MET    LONG TERM GOALS: Target date: 10/04/2024   Pt to be independent with final HEP  Goal status: INITIAL  2.  Pt to demo improved score on DGI by at least 4 points.   Goal status: INITIAL  3.  Pt to demo optimal mechanics with ambulation with cane, for community distances, to improve safety and ability for activity   Goal status: INITIAL  4.  Pt to demo optimal mechanics and balance with bend, lift, squat, to improve balance with IADLs.   Goal status: INITIAL   PLAN:  PT FREQUENCY: 1-2x/week  PT DURATION: 8 weeks  PLANNED INTERVENTIONS: Therapeutic exercises, Therapeutic activity, Neuromuscular re-education, Patient/Family education, Self Care, Joint mobilization, Joint manipulation, Stair training, Orthotic/Fit training, DME instructions, Aquatic Therapy, Dry Needling, Electrical stimulation, Cryotherapy, Moist heat, Taping, Ultrasound, Ionotophoresis 4mg /ml Dexamethasone, Manual therapy,  Vasopneumatic device, Traction, Spinal manipulation, Spinal mobilization,Balance training, Gait training,   PLAN FOR NEXT SESSION:    Walking without cane,  stepping over cones.    Tinnie Don, PT, DPT 8:45 AM  09/05/24

## 2024-09-06 ENCOUNTER — Encounter: Admitting: Physical Therapy

## 2024-09-12 ENCOUNTER — Ambulatory Visit

## 2024-09-13 ENCOUNTER — Ambulatory Visit
Admission: RE | Admit: 2024-09-13 | Discharge: 2024-09-13 | Disposition: A | Source: Ambulatory Visit | Attending: Family Medicine | Admitting: Family Medicine

## 2024-09-13 ENCOUNTER — Encounter: Payer: Self-pay | Admitting: Physical Therapy

## 2024-09-13 ENCOUNTER — Ambulatory Visit: Admitting: Physical Therapy

## 2024-09-13 DIAGNOSIS — R2689 Other abnormalities of gait and mobility: Secondary | ICD-10-CM | POA: Diagnosis not present

## 2024-09-13 DIAGNOSIS — M6281 Muscle weakness (generalized): Secondary | ICD-10-CM | POA: Diagnosis not present

## 2024-09-13 DIAGNOSIS — Z1231 Encounter for screening mammogram for malignant neoplasm of breast: Secondary | ICD-10-CM

## 2024-09-13 NOTE — Therapy (Unsigned)
 - OUTPATIENT PHYSICAL THERAPY LOWER EXTREMITY TREATMENT/PN   Patient Name: LYNNET HEFLEY MRN: [id number] DOB:Nov 05, 1955, 68 y.o., female Today's Date: 09/13/2024   Physical Therapy Progress Note  Dates of Reporting Period: 08/09/24  to 09/13/24    END OF SESSION:  PT End of Session - 09/13/24 0848     Visit Number 10    Number of Visits 16    Date for Recertification  10/04/24    Authorization Type [employer] Medicare -  12  through 12/30    PT Start Time 0848    PT Stop Time 0930    PT Time Calculation (min) 42 min    Activity Tolerance Patient tolerated treatment well    Behavior During Therapy [facility] for tasks assessed/performed             Past Medical History:  Diagnosis Date   Allergy    Arthritis    knees   Cataract 2018   Bilateral surgery Jul/Aug 2020   Crohn's disease (HCC)    colon resection in 20. Dr. Avram unsure of diagnosis- no evidence on colonoscopy   Difficult intravenous access 07/13/2023   Heart murmur    Hx of adenomatous colonic polyps 03/17/2018   Hyperlipidemia    Hypertension    Multiple sclerosis    Neuromuscular disorder (HCC) 11/2021   Diagnosed with Late Onset Multiple Sclerosis   Uterine cancer (HCC) 1989   s/p hysterectomy   Past Surgical History:  Procedure Laterality Date   ABDOMINAL HYSTERECTOMY  1989   Total abdominal, uterine cancer   APPENDECTOMY     BREAST BIOPSY Left 03/2019   fatty tissue   COLON SURGERY  1978-79   COLONOSCOPY     EYE SURGERY  July & Aug 2020   Bilateral Cataracts removed   OOPHORECTOMY     x1   SPINE SURGERY  03/01/2021   Anterior Cervical Discectomy C5,6,7 with Cadeverous Bone Fusion   total hysterectomy     WISDOM TOOTH EXTRACTION     Patient Active Problem List   Diagnosis Date Noted   Difficult intravenous access 07/13/2023   Numbness 03/26/2022   Gait disturbance 03/26/2022   Multiple sclerosis 12/23/2021   High risk medication use 12/23/2021   Vitamin D  deficiency 12/23/2021    Occipital neuralgia of left side 12/23/2021   S/P cervical spinal fusion 12/23/2021   Spinal stenosis in cervical region 12/18/2020   Osteopenia 05/21/2020   Hearing loss 11/11/2019   S/P total hysterectomy 04/14/2018   Hx of adenomatous colonic polyps 03/17/2018   Family history of cerebrovascular accident (CVA) in sister 10/06/2017   Hyperglycemia 04/07/2016   Crohn's disease (HCC)    Chest pain 03/30/2014   Morbid obesity (HCC) 03/07/2011   Osteoarthritis of both knees 01/10/2010   Edema 01/10/2010   Hyperlipidemia 06/02/2007   Essential hypertension 06/02/2007   Allergic rhinitis 06/02/2007     PCP: Garnette Lukes  REFERRING PROVIDER: Garnette Lukes  REFERRING DIAG: MS, muscular deconditioning   THERAPY DIAG:  Other abnormalities of gait and mobility  Muscle weakness (generalized)  Rationale for Evaluation and Treatment: Rehabilitation  ONSET DATE:     SUBJECTIVE:   SUBJECTIVE STATEMENT: Pt with no new complaints.   Eval: Pt Sees neurologist, states that she wants to improve balance. She was diagnosed with MS in 2023. States she doesn't feel as comfortable with her walking and balance.  In house, does not use cane, but feels like she needs to hold on to furniture, feels like she has  lost strength overall, more in arms.  Uses small base quad cane (likes it) . Also has walking stick.  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 Well healed- mild pain with heavy lifting.  Lives with husband  Stairs: 1 step to enter with railing. (3 steps into RV in summer) Grab bars in bathroom. Walk in shower with seat.  Has some arm weights that she uses. [facility] at senior center- 2x/[facility]. But does virtual, does a class every day.  Main complaint: walking   PERTINENT HISTORY: MS, ACDF, OA R knee,   PAIN:  Are you having pain? Yes: NPRS scale: 2-3/10 Pain location: R knee Pain description: sore, stiff, weak Aggravating factors: bending, squat Relieving  factors: rest   PRECAUTIONS: Fall  WEIGHT BEARING RESTRICTIONS: No  FALLS:  Has patient fallen in last 6 months? No  PLOF: Independent  PATIENT GOALS:  Improve balance   NEXT MD VISIT:   OBJECTIVE:   DIAGNOSTIC FINDINGS:  R knee xray:   IMPRESSION: Severe degenerative joint disease of medial joint space. No acute abnormality seen in the right knee.  PATIENT SURVEYS:    COGNITION: Overall cognitive status: Within functional limits for tasks assessed     SENSATION: WFL  EDEMA:    POSTURE:     PALPATION:   LOWER EXTREMITY ROM: Hips: WFL Knees: mild limitation for full flexion/pain at end range.   LOWER EXTREMITY MMT:  MMT Left eval Right  eval  Hip flexion 4 4-  Hip extension    Hip abduction    Hip adduction    Hip internal rotation    Hip external rotation    Knee flexion 4+ 4  Knee extension 4+ 4  Ankle dorsiflexion    Ankle plantarflexion    Ankle inversion    Ankle eversion     (Blank rows = not tested)  LOWER EXTREMITY SPECIAL TESTS:    FUNCTIONAL TESTS:  Eval:  5 time sit to stand:   19. 38 sec DGI:  11   09/13/2024  5 time sit to stand: no hands:  10.84 sec DGI:    17    [facility] PT Assessment - 09/13/24 0001       Dynamic Gait Index   Level Surface Mild Impairment    Change in Gait Speed Mild Impairment    Gait with Horizontal Head Turns Mild Impairment    Gait with Vertical Head Turns Mild Impairment    Gait and Pivot Turn Mild Impairment    Step Over Obstacle Mild Impairment    Step Around Obstacles Normal    Steps Mild Impairment    Total Score 17          GAIT:    TODAY'S TREATMENT:                                                                                                                              DATE:  09/13/2024 Therapeutic Exercise: Aerobic: Supine:   Seated: LAQ 2 x 10 on L  S/L:    Standing:   Hip abd 2 x 10 bil;  Stretches:  Neuromuscular Re-education: Toe taps 6 in step, no UE  support.  Tandem stance 30 sec bil;  Gait:   Gait: no AD : 525 ft cueing for arm swing   Manual Therapy: Therapeutic Activity: Stairs: up/down 6 in steps-  5 stairs without railing with cane x 6,  Without cane x 6   sit to stand x 15- mat table- no hands  Self Care: PPM: DGI and 5 time sit to stand - see above results      Therapeutic Exercise: Aerobic: Supine:  SLR x 12 bil;  Seated:   S/L:  hip abd 2 x  bil;  Standing:   Marching x 20 slow;  Stretches:  Neuromuscular Re-education: Tandem stance 20 sec x 3 bil;  Toe taps on 6 in  step x 20 ea bil- no Ue support Stairs 4 in and 6 in , no UE support x 10 ea bil;  Descending - practice for step - to pattern x 4x leading with R- for knee pain  Gait:   Gait: SPC,  170  ft x 3 laps with [facility]   Manual Therapy: Therapeutic Activity: Self Care:   Therapeutic Exercise: Aerobic: Supine: Seated:  LAQ 3lb ,  2 x 10 bil;   S/L:  hip abd 2 x 8 bil;  Standing:   Marching x 15 fast and x 15/slow ;  Stretches:  Neuromuscular Re-education: Toe taps on 4 in  step x 20 ea bil- no Ue support Weight shifts fwd/bwd - 1 foot on 6 in step x 10 bil- no Ue support Step up 4 in and 6 in no UE support x 10 ea bil;  Gait:   Pre-gait:  fwd stepping with visual tape markers x 20;   Gait: SPC,  170  ft x 3 laps with [facility]   Manual Therapy: Therapeutic Activity: Self Care:    Therapeutic Exercise: Aerobic: Supine: Seated:  LAQ 3lb ,  2 x 10 bil;   Standing:  hip abd 2 x 5 bil (pain in R knee with standing)   Marching x 20/slow ;  Stretches:  Neuromuscular Re-education: Toe taps on 6 in step x 15- no Ue support Toe taps/lunge onto 6 in step x 15 bil; - no UE support Weight shifts fwd/bwd - 1 foot on 6 in step x 10 bil- no Ue support Gait:   Gait: SPC,  45 ft x 6,  trial for cane in L hand -45 ft x 3 .  Manual Therapy: Therapeutic Activity: Sit to stand from (slightly higher )  mat table  x 10; Regular chair height:  x 10- no UE support    Step up 6 in no UE support x 10 bil;   Stairs: up/down 5 steps without hand rail x5, step to onthe  Self Care:   PATIENT EDUCATION:  Education details: updated and reviewed HEP Person educated: Patient Education method: Explanation, Demonstration, Tactile cues, Verbal cues, and Handouts Education comprehension: verbalized understanding, returned demonstration, verbal cues required, tactile cues required, and needs further education   HOME EXERCISE PROGRAM: Access Code: [email or web address]   ASSESSMENT:  CLINICAL IMPRESSION: Pt has been seen fro 10 visits. She is progressing very well. Much improved scores on DGI and sit to stand test today. She also has much improved gait mechanics.  She is still  challenged on stairs but also improved. She will benefit from continued care to ensure HEP and continued strength at home.   Eval: Patient presents with primary complaint of Balance and gait issues from MS. She has decreased dynamic balance with gait and DGI testing. She has decreased confidence and safety with functional activity. She will benefit from education on best use of AD for gait efficiency and safety.  Pt with decreased ability for full functional activities. Pt will  benefit from skilled PT to improve deficits and pain and to return to PLOF.   OBJECTIVE IMPAIRMENTS: Abnormal gait, decreased activity tolerance, decreased balance, decreased knowledge of use of DME, decreased mobility, difficulty walking, decreased strength, increased muscle spasms, impaired UE functional use, improper body mechanics, and pain.   ACTIVITY LIMITATIONS: carrying, lifting, bending, squatting, stairs, reach over head, and locomotion level  PARTICIPATION LIMITATIONS: meal prep, cleaning, laundry, shopping, community activity, and yard work  PERSONAL FACTORS: 1 comorbidity: MS are also affecting patient's functional outcome.   REHAB POTENTIAL: Good  CLINICAL DECISION MAKING: Stable/uncomplicated  EVALUATION  COMPLEXITY: Low   GOALS: Goals reviewed with patient? Yes  SHORT TERM GOALS: Target date: 08/30/2024   Pt to be independent with initial HEP  Goal status: MET  2.  Pt to trial cane use in L hand, for improved support of R LE.   Goal status: MET    LONG TERM GOALS: Target date: 10/04/2024   Pt to be independent with final HEP  Goal status: INITIAL  2.  Pt to demo improved score on DGI by at least 4 points.   Goal status: INITIAL  3.  Pt to demo optimal mechanics with ambulation with cane, for community distances, to improve safety and ability for activity   Goal status: INITIAL  4.  Pt to demo optimal mechanics and balance with bend, lift, squat, to improve balance with IADLs.   Goal status: INITIAL   PLAN:  PT FREQUENCY: 1-2x/week  PT DURATION: 8 weeks  PLANNED INTERVENTIONS: Therapeutic exercises, Therapeutic activity, Neuromuscular re-education, Patient/Family education, Self Care, Joint mobilization, Joint manipulation, Stair training, Orthotic/Fit training, DME instructions, Aquatic Therapy, Dry Needling, Electrical stimulation, Cryotherapy, Moist heat, Taping, Ultrasound, Ionotophoresis 4mg /ml Dexamethasone, Manual therapy,  Vasopneumatic device, Traction, Spinal manipulation, Spinal mobilization,Balance training, Gait training,   PLAN FOR NEXT SESSION:    Walking without cane,  stepping over cones.    Tinnie Don, PT, DPT 8:49 AM  09/13/24

## 2024-09-19 ENCOUNTER — Ambulatory Visit: Admitting: Physical Therapy

## 2024-09-19 DIAGNOSIS — M6281 Muscle weakness (generalized): Secondary | ICD-10-CM | POA: Diagnosis not present

## 2024-09-19 DIAGNOSIS — R2689 Other abnormalities of gait and mobility: Secondary | ICD-10-CM

## 2024-09-19 NOTE — Therapy (Unsigned)
 - OUTPATIENT PHYSICAL THERAPY LOWER EXTREMITY TREATMENT/PN   Patient Name: Savannah Morrow MRN: [id number] DOB:[birth year], 68 y.o., female Today's Date: 09/19/2024   Physical Therapy Progress Note  Dates of Reporting Period: 08/09/24  to 09/13/24    END OF SESSION:       Past Medical History:  Diagnosis Date   Allergy    Arthritis    knees   Cataract 2018   Bilateral surgery Jul/Aug 2020   Crohn's disease (HCC)    colon resection in 20. Dr. Avram unsure of diagnosis- no evidence on colonoscopy   Difficult intravenous access 07/13/2023   Heart murmur    Hx of adenomatous colonic polyps 03/17/2018   Hyperlipidemia    Hypertension    Multiple sclerosis    Neuromuscular disorder (HCC) 11/2021   Diagnosed with Late Onset Multiple Sclerosis   Uterine cancer (HCC) 1989   s/p hysterectomy   Past Surgical History:  Procedure Laterality Date   ABDOMINAL HYSTERECTOMY  1989   Total abdominal, uterine cancer   APPENDECTOMY     BREAST BIOPSY Left 03/2019   fatty tissue   COLON SURGERY  1978-79   COLONOSCOPY     EYE SURGERY  July & Aug 2020   Bilateral Cataracts removed   OOPHORECTOMY     x1   SPINE SURGERY  03/01/2021   Anterior Cervical Discectomy C5,6,7 with Cadeverous Bone Fusion   total hysterectomy     WISDOM TOOTH EXTRACTION     Patient Active Problem List   Diagnosis Date Noted   Difficult intravenous access 07/13/2023   Numbness 03/26/2022   Gait disturbance 03/26/2022   Multiple sclerosis 12/23/2021   High risk medication use 12/23/2021   Vitamin D  deficiency 12/23/2021   Occipital neuralgia of left side 12/23/2021   S/P cervical spinal fusion 12/23/2021   Spinal stenosis in cervical region 12/18/2020   Osteopenia 05/21/2020   Hearing loss 11/11/2019   S/P total hysterectomy 04/14/2018   Hx of adenomatous colonic polyps 03/17/2018   Family history of cerebrovascular accident (CVA) in sister 10/06/2017   Hyperglycemia 04/07/2016   Crohn's disease  (HCC)    Chest pain 03/30/2014   Morbid obesity (HCC) 03/07/2011   Osteoarthritis of both knees 01/10/2010   Edema 01/10/2010   Hyperlipidemia 06/02/2007   Essential hypertension 06/02/2007   Allergic rhinitis 06/02/2007     PCP: Garnette Lukes  REFERRING PROVIDER: Garnette Lukes  REFERRING DIAG: MS, muscular deconditioning   THERAPY DIAG:  No diagnosis found.  Rationale for Evaluation and Treatment: Rehabilitation  ONSET DATE:     SUBJECTIVE:   SUBJECTIVE STATEMENT: Pt with no new complaints. Knee a bit sore today due to cold weather.   Eval: Pt Sees neurologist, states that she wants to improve balance. She was diagnosed with MS in 2023. States she doesn't feel as comfortable with her walking and balance.  In house, does not use cane, but feels like she needs to hold on to furniture, feels like she has lost strength overall, more in arms.  Uses small base quad cane (likes it) . Also has walking stick.  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 Well healed- mild pain with heavy lifting.  Lives with husband  Stairs: 1 step to enter with railing. (3 steps into RV in summer) Grab bars in bathroom. Walk in shower with seat.  Has some arm weights that she uses. [facility]. But does virtual, does a class every day.  Main complaint: walking   PERTINENT HISTORY: MS, ACDF, OA R knee,   PAIN:  Are you having pain? Yes: NPRS scale: 2-3/10 Pain location: R knee Pain description: sore, stiff, weak Aggravating factors: bending, squat Relieving factors: rest   PRECAUTIONS: Fall  WEIGHT BEARING RESTRICTIONS: No  FALLS:  Has patient fallen in last 6 months? No  PLOF: Independent  PATIENT GOALS:  Improve balance   NEXT MD VISIT:   OBJECTIVE:   DIAGNOSTIC FINDINGS:  R knee xray:   IMPRESSION: Severe degenerative joint disease of medial joint space. No acute abnormality seen in the right knee.  PATIENT SURVEYS:     COGNITION: Overall cognitive status: Within functional limits for tasks assessed     SENSATION: WFL  EDEMA:    POSTURE:     PALPATION:   LOWER EXTREMITY ROM: Hips: WFL Knees: mild limitation for full flexion/pain at end range.   LOWER EXTREMITY MMT:  MMT Left eval Right  eval  Hip flexion 4 4-  Hip extension    Hip abduction    Hip adduction    Hip internal rotation    Hip external rotation    Knee flexion 4+ 4  Knee extension 4+ 4  Ankle dorsiflexion    Ankle plantarflexion    Ankle inversion    Ankle eversion     (Blank rows = not tested)  LOWER EXTREMITY SPECIAL TESTS:    FUNCTIONAL TESTS:  Eval:  5 time sit to stand:   19. 38 sec DGI:  11   11/25.  5 time sit to stand: no hands:  10.84 sec DGI:    17      GAIT:    TODAY'S TREATMENT:                                                                                                                              DATE:   09/19/2024 Therapeutic Exercise: Aerobic: Supine:   Seated: LAQ 3lb,   2 x 10 on L  S/L:    Standing:  Hip abd 2 x 10 bil;  Stretches:  Neuromuscular Re-education: Toe taps 6 in step, no UE support.  Tandem stance 30 sec bil;  Fwd and lateral stepping with weight shifts x 10 bil;  AirEx: step ups x 10 bil (light 1 ue support) AirEx: staggered stance weight shifts x 10 bil; Gait:   Gait: no AD : 3 laps , 525 ft cueing for arm swing   Manual Therapy: Therapeutic Activity: Stairs: up/down 6 in steps-  5 stairs without railing with cane x 6,  Without cane x 6   sit to stand x 15- mat table- no hands  Self Care:    Therapeutic Exercise: Aerobic: Supine:  SLR x 12 bil;  Seated:   S/L:  hip abd 2 x  bil;  Standing:   Marching x 20 slow;  Stretches:  Neuromuscular Re-education: Tandem stance 20 sec x 3 bil;  Toe taps  on 6 in  step x 20 ea bil- no Ue support Stairs 4 in and 6 in , no UE support x 10 ea bil;  Descending - practice for step - to pattern x 4x leading  with R- for knee pain  Gait:   Gait: SPC,  170  ft x 3 laps with [facility]   Manual Therapy: Therapeutic Activity: Self Care:   Therapeutic Exercise: Aerobic: Supine: Seated:  LAQ 3lb ,  2 x 10 bil;   S/L:  hip abd 2 x 8 bil;  Standing:   Marching x 15 fast and x 15/slow ;  Stretches:  Neuromuscular Re-education: Toe taps on 4 in  step x 20 ea bil- no Ue support Weight shifts fwd/bwd - 1 foot on 6 in step x 10 bil- no Ue support Step up 4 in and 6 in no UE support x 10 ea bil;  Gait:   Pre-gait:  fwd stepping with visual tape markers x 20;   Gait: SPC,  170  ft x 3 laps with [facility]   Manual Therapy: Therapeutic Activity: Self Care:    Therapeutic Exercise: Aerobic: Supine: Seated:  LAQ 3lb ,  2 x 10 bil;   Standing:  hip abd 2 x 5 bil (pain in R knee with standing)   Marching x 20/slow ;  Stretches:  Neuromuscular Re-education: Toe taps on 6 in step x 15- no Ue support Toe taps/lunge onto 6 in step x 15 bil; - no UE support Weight shifts fwd/bwd - 1 foot on 6 in step x 10 bil- no Ue support Gait:   Gait: SPC,  45 ft x 6,  trial for cane in L hand -45 ft x 3 .  Manual Therapy: Therapeutic Activity: Sit to stand from (slightly higher )  mat table  x 10; Regular chair height:  x 10- no UE support   Step up 6 in no UE support x 10 bil;   Stairs: up/down 5 steps without hand rail x5, step to onthe  Self Care:   PATIENT EDUCATION:  Education details: updated and reviewed HEP Person educated: Patient Education method: Explanation, Demonstration, Tactile cues, Verbal cues, and Handouts Education comprehension: verbalized understanding, returned demonstration, verbal cues required, tactile cues required, and needs further education   HOME EXERCISE PROGRAM: Access Code: [email or web address]   ASSESSMENT:  CLINICAL IMPRESSION: Pt has been seen fro 10 visits. She is progressing very well. Much improved scores on DGI and sit to stand test today. She also has much improved gait mechanics.   She is still challenged on stairs but also improved. She will benefit from continued care to ensure HEP and continued strength at home.   Eval: Patient presents with primary complaint of Balance and gait issues from MS. She has decreased dynamic balance with gait and DGI testing. She has decreased confidence and safety with functional activity. She will benefit from education on best use of AD for gait efficiency and safety.  Pt with decreased ability for full functional activities. Pt will  benefit from skilled PT to improve deficits and pain and to return to PLOF.   OBJECTIVE IMPAIRMENTS: Abnormal gait, decreased activity tolerance, decreased balance, decreased knowledge of use of DME, decreased mobility, difficulty walking, decreased strength, increased muscle spasms, impaired UE functional use, improper body mechanics, and pain.   ACTIVITY LIMITATIONS: carrying, lifting, bending, squatting, stairs, reach over head, and locomotion level  PARTICIPATION LIMITATIONS: meal prep, cleaning, laundry, shopping, community activity, and yard work  PERSONAL FACTORS:  1 comorbidity: MS are also affecting patient's functional outcome.   REHAB POTENTIAL: Good  CLINICAL DECISION MAKING: Stable/uncomplicated  EVALUATION COMPLEXITY: Low   GOALS: Goals reviewed with patient? Yes  SHORT TERM GOALS: Target date: 08/30/2024   Pt to be independent with initial HEP  Goal status: MET  2.  Pt to trial cane use in L hand, for improved support of R LE.   Goal status: MET    LONG TERM GOALS: Target date: 10/04/2024   Pt to be independent with final HEP  Goal status: INITIAL  2.  Pt to demo improved score on DGI by at least 4 points.   Goal status: INITIAL  3.  Pt to demo optimal mechanics with ambulation with cane, for community distances, to improve safety and ability for activity   Goal status: INITIAL  4.  Pt to demo optimal mechanics and balance with bend, lift, squat, to improve  balance with IADLs.   Goal status: INITIAL   PLAN:  PT FREQUENCY: 1-2x/week  PT DURATION: 8 weeks  PLANNED INTERVENTIONS: Therapeutic exercises, Therapeutic activity, Neuromuscular re-education, Patient/Family education, Self Care, Joint mobilization, Joint manipulation, Stair training, Orthotic/Fit training, DME instructions, Aquatic Therapy, Dry Needling, Electrical stimulation, Cryotherapy, Moist heat, Taping, Ultrasound, Ionotophoresis 4mg /ml Dexamethasone, Manual therapy,  Vasopneumatic device, Traction, Spinal manipulation, Spinal mobilization,Balance training, Gait training,   PLAN FOR NEXT SESSION:    Walking without cane,  stepping over cones.    Tinnie Don, PT, DPT 12:18 PM  09/19/24

## 2024-09-21 ENCOUNTER — Encounter: Payer: Self-pay | Admitting: Physical Therapy

## 2024-10-06 ENCOUNTER — Ambulatory Visit: Admitting: Physical Therapy

## 2024-10-06 ENCOUNTER — Encounter: Payer: Self-pay | Admitting: Physical Therapy

## 2024-10-06 DIAGNOSIS — M6281 Muscle weakness (generalized): Secondary | ICD-10-CM

## 2024-10-06 DIAGNOSIS — R2689 Other abnormalities of gait and mobility: Secondary | ICD-10-CM | POA: Diagnosis not present

## 2024-10-06 NOTE — Therapy (Signed)
 - OUTPATIENT PHYSICAL THERAPY LOWER EXTREMITY TREATMENT/Re-cert /D/C   Patient Name: Savannah Morrow MRN: [id number] DOB:[birth year], 68 y.o., female Today's Date: 87/81/7974  Re-cert for todays date only. Previous POC up 10/04/24.   END OF SESSION:  PT End of Session - 10/06/24 0959     Visit Number 12    Number of Visits 16    Date for Recertification  10/06/24    Authorization Type [employer] -  12  through 12/30    PT Start Time 0931    PT Stop Time 1013    PT Time Calculation (min) 42 min    Activity Tolerance Patient tolerated treatment well    Behavior During Therapy [facility] for tasks assessed/performed               Past Medical History:  Diagnosis Date   Allergy    Arthritis    knees   Cataract 2018   Bilateral surgery Jul/Aug 2020   Crohn's disease (HCC)    colon resection in 20. Dr. Avram unsure of diagnosis- no evidence on colonoscopy   Difficult intravenous access 07/13/2023   Heart murmur    Hx of adenomatous colonic polyps 03/17/2018   Hyperlipidemia    Hypertension    Multiple sclerosis    Neuromuscular disorder (HCC) 11/2021   Diagnosed with Late Onset Multiple Sclerosis   Uterine cancer (HCC) 1989   s/p hysterectomy   Past Surgical History:  Procedure Laterality Date   ABDOMINAL HYSTERECTOMY  1989   Total abdominal, uterine cancer   APPENDECTOMY     BREAST BIOPSY Left 03/2019   fatty tissue   COLON SURGERY  1978-79   COLONOSCOPY     EYE SURGERY  July & Aug 2020   Bilateral Cataracts removed   OOPHORECTOMY     x1   SPINE SURGERY  03/01/2021   Anterior Cervical Discectomy C5,6,7 with Cadeverous Bone Fusion   total hysterectomy     WISDOM TOOTH EXTRACTION     Patient Active Problem List   Diagnosis Date Noted   Difficult intravenous access 07/13/2023   Numbness 03/26/2022   Gait disturbance 03/26/2022   Multiple sclerosis 12/23/2021   High risk medication use 12/23/2021   Vitamin D  deficiency 12/23/2021   Occipital neuralgia of  left side 12/23/2021   S/P cervical spinal fusion 12/23/2021   Spinal stenosis in cervical region 12/18/2020   Osteopenia 05/21/2020   Hearing loss 11/11/2019   S/P total hysterectomy 04/14/2018   Hx of adenomatous colonic polyps 03/17/2018   Family history of cerebrovascular accident (CVA) in sister 10/06/2017   Hyperglycemia 04/07/2016   Crohn's disease (HCC)    Chest pain 03/30/2014   Morbid obesity (HCC) 03/07/2011   Osteoarthritis of both knees 01/10/2010   Edema 01/10/2010   Hyperlipidemia 06/02/2007   Essential hypertension 06/02/2007   Allergic rhinitis 06/02/2007     PCP: Garnette Lukes  REFERRING PROVIDER: Garnette Lukes  REFERRING DIAG: MS, muscular deconditioning   THERAPY DIAG:  Other abnormalities of gait and mobility  Muscle weakness (generalized)  Rationale for Evaluation and Treatment: Rehabilitation  ONSET DATE:     SUBJECTIVE:   SUBJECTIVE STATEMENT: Pt with no new complaints.  Taking her time on stairs, feels like she is doing well. Has been using cane outdoors.   Eval: Pt Sees neurologist, states that she wants to improve balance. She was diagnosed with MS in 2023. States she doesn't feel as comfortable with her walking and balance.  In house, does not use cane,  but feels like she needs to hold on to furniture, feels like she has lost strength overall, more in arms.  Uses small base quad cane (likes it) . Also has walking stick.  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 Well healed- mild pain with heavy lifting.  Lives with husband  Stairs: 1 step to enter with railing. (3 steps into RV in summer) Grab bars in bathroom. Walk in shower with seat.  Has some arm weights that she uses. [facility] at senior center- 2x/[facility]. But does virtual, does a class every day.  Main complaint: walking   PERTINENT HISTORY: MS, ACDF, OA R knee,   PAIN:  Are you having pain? Yes: NPRS scale: 2-3/10 Pain location: R knee Pain description: sore,  stiff, weak Aggravating factors: bending, squat Relieving factors: rest   PRECAUTIONS: Fall  WEIGHT BEARING RESTRICTIONS: No  FALLS:  Has patient fallen in last 6 months? No  PLOF: Independent  PATIENT GOALS:  Improve balance   NEXT MD VISIT:   OBJECTIVE:   DIAGNOSTIC FINDINGS:  R knee xray:   IMPRESSION: Severe degenerative joint disease of medial joint space. No acute abnormality seen in the right knee.  PATIENT SURVEYS:    COGNITION: Overall cognitive status: Within functional limits for tasks assessed     SENSATION: WFL  EDEMA:    POSTURE:     PALPATION:   LOWER EXTREMITY ROM: Hips: WFL Knees: mild limitation for full flexion/pain at end range.   LOWER EXTREMITY MMT:  MMT Left eval Right  eval L  /  R   Hip flexion 4 4- 4/  4  Hip extension     Hip abduction   4/  4  Hip adduction     Hip internal rotation     Hip external rotation     Knee flexion 4+ 4 5/  4+  Knee extension 4+ 4 5/  4+  Ankle dorsiflexion     Ankle plantarflexion     Ankle inversion     Ankle eversion      (Blank rows = not tested)  LOWER EXTREMITY SPECIAL TESTS:    FUNCTIONAL TESTS:  Eval:  5 time sit to stand:   19. 38 sec DGI:  11   11/25.  5 time sit to stand: no hands:  10.84 sec DGI:    17     GAIT:    TODAY'S TREATMENT:                                                                                                                              DATE:   10/06/2024 Therapeutic Exercise: Aerobic: Supine:   Seated:  S/L:    Standing:  Stretches:  Neuromuscular Re-education: Toe taps 6 in step, no UE support.  Tandem stance 30 sec bil;  AirEx: step ups x 10 bil (light 1 ue support) AirEx bil UE flexion x 12;   Walking  around cones,  stepping over cones x 8 ;  Gait:    Manual Therapy: Therapeutic Activity: Stairs: step ups, no AD, 6 in step x 10 bil;  Gait: no AD : 3 laps , 525 ft cueing for arm swing   Self Care:    Therapeutic  Exercise: Aerobic: Supine:  SLR x 12 bil;  Seated:   S/L:  hip abd 2 x  bil;  Standing:   Marching x 20 slow;  Stretches:  Neuromuscular Re-education: Tandem stance 20 sec x 3 bil;  Toe taps on 6 in  step x 20 ea bil- no Ue support Stairs 4 in and 6 in , no UE support x 10 ea bil;  Descending - practice for step - to pattern x 4x leading with R- for knee pain  Gait:   Gait: SPC,  170  ft x 3 laps with [facility]   Manual Therapy: Therapeutic Activity: Self Care:   Therapeutic Exercise: Aerobic: Supine: Seated:  LAQ 3lb ,  2 x 10 bil;   S/L:  hip abd 2 x 8 bil;  Standing:   Marching x 15 fast and x 15/slow ;  Stretches:  Neuromuscular Re-education: Toe taps on 4 in  step x 20 ea bil- no Ue support Weight shifts fwd/bwd - 1 foot on 6 in step x 10 bil- no Ue support Step up 4 in and 6 in no UE support x 10 ea bil;  Gait:   Pre-gait:  fwd stepping with visual tape markers x 20;   Gait: SPC,  170  ft x 3 laps with SPC   Manual Therapy: Therapeutic Activity: Self Care:    PATIENT EDUCATION:  Education details: updated and reviewed HEP Person educated: Patient Education method: Explanation, Demonstration, Tactile cues, Verbal cues, and Handouts Education comprehension: verbalized understanding, returned demonstration, verbal cues required, tactile cues required, and needs further education   HOME EXERCISE PROGRAM: Access Code: [email or web address]   ASSESSMENT:  CLINICAL IMPRESSION: RE-Cert for todays date only. Pt has been sen for 12 visits. She has made good progress and has met goals at this time. She has much improved gait pattern and efficiency. She has good safety without AD on indoor surfaces, and will continue to use cane outdoors. She has mild instability, mostly stemming from deficits and OA in her R knee. Discussed f/u with MD for knee in future as needed. Pt ready for d/c at this time, pt in agreement with plan. Final HEP reviewed in detail.    Eval: Patient presents with  primary complaint of Balance and gait issues from MS. She has decreased dynamic balance with gait and DGI testing. She has decreased confidence and safety with functional activity. She will benefit from education on best use of AD for gait efficiency and safety.  Pt with decreased ability for full functional activities. Pt will  benefit from skilled PT to improve deficits and pain and to return to PLOF.   OBJECTIVE IMPAIRMENTS: Abnormal gait, decreased activity tolerance, decreased balance, decreased knowledge of use of DME, decreased mobility, difficulty walking, decreased strength, increased muscle spasms, impaired UE functional use, improper body mechanics, and pain.   ACTIVITY LIMITATIONS: carrying, lifting, bending, squatting, stairs, reach over head, and locomotion level  PARTICIPATION LIMITATIONS: meal prep, cleaning, laundry, shopping, community activity, and yard work  PERSONAL FACTORS: 1 comorbidity: MS are also affecting patient's functional outcome.   REHAB POTENTIAL: Good  CLINICAL DECISION MAKING: Stable/uncomplicated  EVALUATION COMPLEXITY: Low   GOALS: Goals reviewed with patient?  Yes  SHORT TERM GOALS: Target date: 08/30/2024   Pt to be independent with initial HEP  Goal status: MET  2.  Pt to trial cane use in L hand, for improved support of R LE.   Goal status: MET    LONG TERM GOALS: Target date: 10/06/2024   Pt to be independent with final HEP  Goal status: MET  2.  Pt to demo improved score on DGI by at least 4 points.   Goal status: MET  3.  Pt to demo optimal mechanics with ambulation with cane, for community distances, to improve safety and ability for activity   Goal status:MET  4.  Pt to demo optimal mechanics and balance with bend, lift, squat, to improve balance with IADLs.   Goal status: MET   PLAN:  PT FREQUENCY: 1-2x/week  PT DURATION: 8 weeks  PLANNED INTERVENTIONS: Therapeutic exercises, Therapeutic activity, Neuromuscular  re-education, Patient/Family education, Self Care, Joint mobilization, Joint manipulation, Stair training, Orthotic/Fit training, DME instructions, Aquatic Therapy, Dry Needling, Electrical stimulation, Cryotherapy, Moist heat, Taping, Ultrasound, Ionotophoresis 4mg /ml Dexamethasone, Manual therapy,  Vasopneumatic device, Traction, Spinal manipulation, Spinal mobilization,Balance training, Gait training,   PLAN FOR NEXT SESSION:    Walking without cane,  stepping over cones.    Tinnie Don, PT, DPT 11:26 AM  10/06/2024  PHYSICAL THERAPY DISCHARGE SUMMARY  Visits from Start of Care: 12  Plan: Patient agrees to discharge.  Patient goals were met. Patient is being discharged due to meeting the stated rehab goals.     Tinnie Don, PT, DPT 11:36 AM  10/06/2024

## 2025-01-11 ENCOUNTER — Ambulatory Visit: Admitting: Family Medicine

## 2025-05-25 ENCOUNTER — Ambulatory Visit

## 2025-08-08 ENCOUNTER — Ambulatory Visit: Admitting: Neurology

## 2025-08-14 ENCOUNTER — Ambulatory Visit: Admitting: Neurology
